# Patient Record
Sex: Female | Born: 1965
Health system: Southern US, Community
[De-identification: ages and names within clinical notes are randomized; demographics above are authoritative.]

## PROBLEM LIST (undated history)

## (undated) DIAGNOSIS — Z803 Family history of malignant neoplasm of breast: Secondary | ICD-10-CM

## (undated) DIAGNOSIS — I10 Essential (primary) hypertension: Secondary | ICD-10-CM

## (undated) DIAGNOSIS — Z8042 Family history of malignant neoplasm of prostate: Secondary | ICD-10-CM

## (undated) DIAGNOSIS — F32A Depression, unspecified: Secondary | ICD-10-CM

## (undated) DIAGNOSIS — E785 Hyperlipidemia, unspecified: Secondary | ICD-10-CM

## (undated) DIAGNOSIS — T7840XA Allergy, unspecified, initial encounter: Secondary | ICD-10-CM

## (undated) DIAGNOSIS — R791 Abnormal coagulation profile: Secondary | ICD-10-CM

## (undated) DIAGNOSIS — Z801 Family history of malignant neoplasm of trachea, bronchus and lung: Secondary | ICD-10-CM

## (undated) DIAGNOSIS — F329 Major depressive disorder, single episode, unspecified: Secondary | ICD-10-CM

## (undated) DIAGNOSIS — F419 Anxiety disorder, unspecified: Secondary | ICD-10-CM

## (undated) DIAGNOSIS — E079 Disorder of thyroid, unspecified: Secondary | ICD-10-CM

## (undated) DIAGNOSIS — E039 Hypothyroidism, unspecified: Secondary | ICD-10-CM

## (undated) DIAGNOSIS — J45909 Unspecified asthma, uncomplicated: Secondary | ICD-10-CM

## (undated) DIAGNOSIS — Z800 Family history of malignant neoplasm of digestive organs: Secondary | ICD-10-CM

## (undated) DIAGNOSIS — I252 Old myocardial infarction: Secondary | ICD-10-CM

## (undated) HISTORY — DX: Family history of malignant neoplasm of prostate: Z80.42

## (undated) HISTORY — DX: Family history of malignant neoplasm of digestive organs: Z80.0

## (undated) HISTORY — DX: Depression, unspecified: F32.A

## (undated) HISTORY — PX: EYE SURGERY: SHX253

## (undated) HISTORY — DX: Family history of malignant neoplasm of trachea, bronchus and lung: Z80.1

## (undated) HISTORY — DX: Major depressive disorder, single episode, unspecified: F32.9

## (undated) HISTORY — DX: Disorder of thyroid, unspecified: E07.9

## (undated) HISTORY — DX: Anxiety disorder, unspecified: F41.9

## (undated) HISTORY — DX: Allergy, unspecified, initial encounter: T78.40XA

## (undated) HISTORY — DX: Essential (primary) hypertension: I10

## (undated) HISTORY — PX: APPENDECTOMY: SHX54

## (undated) HISTORY — DX: Family history of malignant neoplasm of breast: Z80.3

## (undated) HISTORY — DX: Hyperlipidemia, unspecified: E78.5

---
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
## 2013-11-24 DIAGNOSIS — R519 Headache, unspecified: Secondary | ICD-10-CM | POA: Insufficient documentation

## 2013-11-24 DIAGNOSIS — G8929 Other chronic pain: Secondary | ICD-10-CM | POA: Insufficient documentation

## 2013-11-24 DIAGNOSIS — R51 Headache: Secondary | ICD-10-CM

## 2013-11-24 DIAGNOSIS — I10 Essential (primary) hypertension: Secondary | ICD-10-CM | POA: Insufficient documentation

## 2013-12-10 DIAGNOSIS — M51379 Other intervertebral disc degeneration, lumbosacral region without mention of lumbar back pain or lower extremity pain: Secondary | ICD-10-CM | POA: Insufficient documentation

## 2013-12-10 DIAGNOSIS — M5137 Other intervertebral disc degeneration, lumbosacral region: Secondary | ICD-10-CM | POA: Insufficient documentation

## 2014-06-10 DIAGNOSIS — F332 Major depressive disorder, recurrent severe without psychotic features: Secondary | ICD-10-CM | POA: Insufficient documentation

## 2014-09-28 DIAGNOSIS — J4540 Moderate persistent asthma, uncomplicated: Secondary | ICD-10-CM | POA: Insufficient documentation

## 2015-05-04 DIAGNOSIS — E782 Mixed hyperlipidemia: Secondary | ICD-10-CM | POA: Insufficient documentation

## 2015-06-28 DIAGNOSIS — I252 Old myocardial infarction: Secondary | ICD-10-CM | POA: Insufficient documentation

## 2016-11-08 LAB — HM COLONOSCOPY

## 2017-04-15 LAB — HM MAMMOGRAPHY

## 2017-10-16 LAB — HM PAP SMEAR: HM PAP: NEGATIVE

## 2018-11-30 ENCOUNTER — Ambulatory Visit (INDEPENDENT_AMBULATORY_CARE_PROVIDER_SITE_OTHER): Payer: 59 | Admitting: Physician Assistant

## 2018-11-30 ENCOUNTER — Encounter: Payer: Self-pay | Admitting: Physician Assistant

## 2018-11-30 VITALS — BP 125/75 | HR 82 | Resp 14 | Ht 62.0 in | Wt 163.0 lb

## 2018-11-30 DIAGNOSIS — Z7689 Persons encountering health services in other specified circumstances: Secondary | ICD-10-CM | POA: Diagnosis not present

## 2018-11-30 DIAGNOSIS — I10 Essential (primary) hypertension: Secondary | ICD-10-CM | POA: Diagnosis not present

## 2018-11-30 DIAGNOSIS — E063 Autoimmune thyroiditis: Secondary | ICD-10-CM | POA: Diagnosis not present

## 2018-11-30 DIAGNOSIS — Z780 Asymptomatic menopausal state: Secondary | ICD-10-CM | POA: Diagnosis not present

## 2018-11-30 DIAGNOSIS — F331 Major depressive disorder, recurrent, moderate: Secondary | ICD-10-CM

## 2018-11-30 DIAGNOSIS — Z1231 Encounter for screening mammogram for malignant neoplasm of breast: Secondary | ICD-10-CM

## 2018-11-30 DIAGNOSIS — E782 Mixed hyperlipidemia: Secondary | ICD-10-CM | POA: Diagnosis not present

## 2018-11-30 DIAGNOSIS — Z5320 Procedure and treatment not carried out because of patient's decision for unspecified reasons: Secondary | ICD-10-CM

## 2018-11-30 DIAGNOSIS — J4520 Mild intermittent asthma, uncomplicated: Secondary | ICD-10-CM | POA: Diagnosis not present

## 2018-11-30 DIAGNOSIS — Z5181 Encounter for therapeutic drug level monitoring: Secondary | ICD-10-CM

## 2018-11-30 MED ORDER — ALBUTEROL SULFATE 108 (90 BASE) MCG/ACT IN AEPB: 1.0000 | INHALATION_SPRAY | RESPIRATORY_TRACT | 4 refills | Status: DC | PRN

## 2018-11-30 MED ORDER — LISINOPRIL 20 MG PO TABS: 20.0000 mg | ORAL_TABLET | Freq: Every day | ORAL | 1 refills | Status: DC

## 2018-11-30 MED ORDER — LEVOTHYROXINE SODIUM 50 MCG PO TABS: 50.0000 ug | ORAL_TABLET | Freq: Every day | ORAL | 1 refills | Status: DC

## 2018-11-30 MED ORDER — RED YEAST RICE 600 MG PO TABS: 2.0000 | ORAL_TABLET | Freq: Three times a day (TID) | ORAL | 0 refills | Status: DC

## 2018-11-30 MED ORDER — ASPIRIN EC 81 MG PO TBEC: 81.0000 mg | DELAYED_RELEASE_TABLET | Freq: Every day | ORAL | 3 refills | Status: DC

## 2018-11-30 MED ORDER — AMLODIPINE BESYLATE 5 MG PO TABS: 5.0000 mg | ORAL_TABLET | Freq: Every day | ORAL | 1 refills | Status: DC

## 2018-11-30 MED FILL — LISINOPRIL 20 MG TABLET: 20 | 90 days supply | Qty: 90 | Fill #0

## 2018-11-30 MED FILL — AMLODIPINE BESYLATE 5 MG TA: 5 | 90 days supply | Qty: 90 | Fill #0

## 2018-11-30 MED FILL — PROAIR RESPICLICK INHAL PWD: 108 (90 BAS | 17 days supply | Qty: 1 | Fill #0

## 2018-11-30 MED FILL — ASPIRIN ADULT LOW STRENGTH: 81 | 90 days supply | Qty: 90 | Fill #0

## 2018-11-30 MED FILL — LEVOTHYROXINE 50 MCG TABLET: 50 | 90 days supply | Qty: 90 | Fill #0

## 2018-11-30 NOTE — Progress Notes (Signed)
HPI:                                                                Shannon Santiago is a 53 y.o. female who presents to [facility] today to establish care  Current concerns: Rx refills  HTN: diagnosed in 2015. Taking Lisinopril and Amlodipine daily. Compliant with medications. Does not regularly check BP's at home. States she is doing intermittent fasting. Denies vision change, headache, chest pain with exertion, orthopnea, lightheadedness, syncope and edema. Risk factors include: HLD, postmenopausal  Depression/Anxiety: followed by a counselor currently Shannon Deis, LCSW). Reports she has taken multiple medications in the past, but they all caused adverse effects. She is not interested in taking medication at this time. Denies symptoms of mania/hypomania. Denies suicidal thinking. Denies auditory/visual hallucinations.  Multiple allergies: reports allergies to milk/dairy and peanuts as well as environmental allergens. States she has had anaphylaxis to peanuts in the past, but she actually eats peanut products fairly regularly and doesn't always have a problem with them.  Asthma: triggers include dust, cold/heat, emotional stress. No exacerbations in the last year. Reports mood changes with Singulair. Does not think she can afford inhalers. Currently uses Albuterol prn, but has not needed it in the last year.  Depression screen PHQ 2/9 11/30/2018  Decreased Interest 2  Down, Depressed, Hopeless 3  PHQ - 2 Score 5  Altered sleeping 3  Tired, decreased energy 3  Change in appetite 2  Feeling bad or failure about yourself  3  Trouble concentrating 2  Moving slowly or fidgety/restless 0  Suicidal thoughts 0  PHQ-9 Score 18    GAD 7 : Generalized Anxiety Score 11/30/2018  Nervous, Anxious, on Edge 3  Control/stop worrying 3  Worry too much - different things 3  Trouble relaxing 3  Restless 2  Easily annoyed or irritable 2  Afraid -  awful might happen 2  Total GAD 7 Score 18      Past Medical History:  Diagnosis Date  . Allergy   . Anxiety   . Depression   . Hyperlipidemia   . Hypertension   . Thyroid disease    Past Surgical History:  Procedure Laterality Date  . EYE SURGERY     x 5   Social History   Tobacco Use  . Smoking status: Never Smoker  . Smokeless tobacco: Never Used  Substance Use Topics  . Alcohol use: Yes    Alcohol/week: 2.0 standard drinks    Types: 2 Standard drinks or equivalent per week   family history includes Diabetes in her father; Hypertension in her mother; Lung cancer in her mother; Prostate cancer in her father; Skin cancer in her father; Stroke in her mother.    ROS: Review of Systems  HENT: Positive for sinus pain.   Eyes:       + vision change  Musculoskeletal: Positive for back pain.  Neurological: Positive for headaches.  Endo/Heme/Allergies:       + heat intolerance + cold intolerance  Psychiatric/Behavioral: Positive for depression. The patient is nervous/anxious and has insomnia.      Medications: Current Outpatient Medications  Medication Sig Dispense Refill  . amLODipine (NORVASC) 5 MG tablet Take 1 tablet (5 mg total) by  mouth daily. 90 tablet 1  . fexofenadine (ALLEGRA) 180 MG tablet Take by mouth.    . levothyroxine (SYNTHROID, LEVOTHROID) 50 MCG tablet Take 1 tablet (50 mcg total) by mouth daily before breakfast. 90 tablet 1  . lisinopril (PRINIVIL,ZESTRIL) 20 MG tablet Take 1 tablet (20 mg total) by mouth daily. 90 tablet 1  . Albuterol Sulfate (PROAIR RESPICLICK) 834 (90 Base) MCG/ACT AEPB Inhale 1-2 puffs into the lungs every 4 (four) hours as needed. 1 each 4  . aspirin EC 81 MG tablet Take 1 tablet (81 mg total) by mouth daily. 90 tablet 3  . Red Yeast Rice 600 MG TABS Take 2 tablets (1,200 mg total) by mouth 3 (three) times daily with meals. 90 tablet 0   No current facility-administered medications for this visit.    Allergies  Allergen  Reactions  . Morphine And Related Other (See Comments)    Numb hands and feet  . Peanut Oil Other (See Comments)    Stomach cramping, hand/foot swelling  . Penicillins Hives  . Polymyxin B-Trimethoprim Other (See Comments), Photosensitivity and Swelling  . Singulair [Montelukast Sodium] Other (See Comments)    Mood changes, worsening depression       Objective:  BP 125/75   Pulse 82   Resp 14   Ht 5\' 2"  (1.575 m)   Wt 163 lb (73.9 kg)   SpO2 97%   BMI 29.81 kg/m  Gen:  alert, not ill-appearing, no distress, appropriate for age HEENT: head normocephalic without obvious abnormality, conjunctiva and cornea clear, trachea midline Pulm: Normal work of breathing, normal phonation, clear to auscultation bilaterally, no wheezes, rales or rhonchi CV: Normal rate, regular rhythm, s1 and s2 distinct, no murmurs, clicks or rubs  Neuro: alert and oriented x 3, no tremor MSK: extremities atraumatic, normal gait and station Skin: intact, no rashes on exposed skin, no jaundice, no cyanosis Psych: appearance casual, cooperative, good eye contact, depressed mood, affect mood-congruent, speech is articulate, thought processes clear and goal-directed    No results found for this or any previous visit (from the past 72 hour(s)). No results found.    Assessment and Plan: 53 y.o. female with   .Jeanett was seen today for establish care.  Diagnoses and all orders for this visit:  Encounter to establish care  Hypertension goal BP (blood pressure) < 130/80 -     amLODipine (NORVASC) 5 MG tablet; Take 1 tablet (5 mg total) by mouth daily. -     lisinopril (PRINIVIL,ZESTRIL) 20 MG tablet; Take 1 tablet (20 mg total) by mouth daily. -     aspirin EC 81 MG tablet; Take 1 tablet (81 mg total) by mouth daily. -     CBC -     COMPLETE METABOLIC PANEL WITH GFR  Hashimoto's thyroiditis -     levothyroxine (SYNTHROID, LEVOTHROID) 50 MCG tablet; Take 1 tablet (50 mcg total) by mouth daily before  breakfast. -     TSH + free T4  Postmenopausal  Moderate persistent asthma without complication -     Albuterol Sulfate (PROAIR RESPICLICK) 196 (90 Base) MCG/ACT AEPB; Inhale 1-2 puffs into the lungs every 4 (four) hours as needed.  Mixed hyperlipidemia -     Lipid Panel w/reflex Direct LDL -     Red Yeast Rice 600 MG TABS; Take 2 tablets (1,200 mg total) by mouth 3 (three) times daily with meals.  Moderate episode of recurrent major depressive disorder (HCC)  Statin declined  Breast cancer screening by  mammogram -     MM 3D SCREEN BREAST BILATERAL; Future  Medication monitoring encounter -     CBC -     COMPLETE METABOLIC PANEL WITH GFR -     TSH + free T4    - Personally reviewed PMH, PSH, PFH, medications, allergies, HM - Personally reviewed labs in Care Everywhere dated 04/15/18 - Age-appropriate cancer screening: Pap UTD 10/13/17 NILM, HPV negative, will abstract; Colonoscopy UTD per patient, requesting record from [facility] - Influenza UTD - Tdap UTD - PHQ2 negative  HTN CMP 03/2018 shows normal renal function with Scr 0.81, GFR 84 BP in range in office today Baby aspirin 81 mg recommended for primary prevention  Cont current medications. Refills provided  Mixed hyperlipidemia Lipid profile (03/2018) TC 251  LDL 186 Declined statin therapy Recommended red yeast rice extract  Hashimotos TSH 2.21 (03/2018) Cont Levothyroxine 50 mcg  MDD PHQ9=18, no acute safety issues Declines medication mgmt. Reports hx of multiple medication intolerances Keep f/u with counselor She was provided with information on optional herbal supplements that may be beneficial for depressive symptoms  Asthma Mild, intermittent Pulse ox 97% on RA at rest Cont Albuterol 1-2 puff Q4 hr prn  Patient education and anticipatory guidance given Patient agrees with treatment plan Follow-up in 4 months for CPE w/fasting labs or sooner as needed if symptoms worsen or fail to  improve  Darlyne Russian PA-C

## 2018-11-30 NOTE — Patient Instructions (Addendum)
For your blood pressure: - Goal <130/80 (Ideally 120's/70's) - take your blood pressure medication in the morning (unless instructed differently) - take baby aspirin 81 mg daily to help prevent heart attack/stroke - monitor and log blood pressures at home - check around the same time each day in a relaxed setting - Limit salt to <2500 mg/day - Follow DASH (Dietary Approach to Stopping Hypertension) eating plan - Try to get at least 150 minutes of aerobic exercise per week - Aim to go on a brisk walk 30 minutes per day at least 5 days per week. If you're not active, gradually increase how long you walk by 5 minutes each week - limit alcohol: 2 standard drinks per day for men and 1 per day for women - avoid tobacco/nicotine products. Consider smoking cessation if you smoke - weight loss: 7% of current body weight can reduce your blood pressure by 5-10 points - follow-up at least every 6 months for your blood pressure. Follow-up sooner if your BP is not controlled   DASH Eating Plan DASH stands for "Dietary Approaches to Stop Hypertension." The DASH eating plan is a healthy eating plan that has been shown to reduce high blood pressure (hypertension). It may also reduce your risk for type 2 diabetes, heart disease, and stroke. The DASH eating plan may also help with weight loss. What are tips for following this plan?  General guidelines  Avoid eating more than 2,300 mg (milligrams) of salt (sodium) a day. If you have hypertension, you may need to reduce your sodium intake to 1,500 mg a day.  Limit alcohol intake to no more than 1 drink a day for nonpregnant women and 2 drinks a day for men. One drink equals 12 oz of beer, 5 oz of wine, or 1 oz of hard liquor.  Work with your health care provider to maintain a healthy body weight or to lose weight. Ask what an ideal weight is for you.  Get at least 30 minutes of exercise that causes your heart to beat faster (aerobic exercise) most days of  the week. Activities may include walking, swimming, or biking.  Work with your health care provider or diet and nutrition specialist (dietitian) to adjust your eating plan to your individual calorie needs. Reading food labels   Check food labels for the amount of sodium per serving. Choose foods with less than 5 percent of the Daily Value of sodium. Generally, foods with less than 300 mg of sodium per serving fit into this eating plan.  To find whole grains, look for the word "whole" as the first word in the ingredient list. Shopping  Buy products labeled as "low-sodium" or "no salt added."  Buy fresh foods. Avoid canned foods and premade or frozen meals. Cooking  Avoid adding salt when cooking. Use salt-free seasonings or herbs instead of table salt or sea salt. Check with your health care provider or pharmacist before using salt substitutes.  Do not fry foods. Cook foods using healthy methods such as baking, boiling, grilling, and broiling instead.  Cook with heart-healthy oils, such as olive, canola, soybean, or sunflower oil. Meal planning  Eat a balanced diet that includes: ? 5 or more servings of fruits and vegetables each day. At each meal, try to fill half of your plate with fruits and vegetables. ? Up to 6-8 servings of whole grains each day. ? Less than 6 oz of lean meat, poultry, or fish each day. A 3-oz serving of meat is about   the same size as a deck of cards. One egg equals 1 oz. ? 2 servings of low-fat dairy each day. ? A serving of nuts, seeds, or beans 5 times each week. ? Heart-healthy fats. Healthy fats called Omega-3 fatty acids are found in foods such as flaxseeds and coldwater fish, like sardines, salmon, and mackerel.  Limit how much you eat of the following: ? Canned or prepackaged foods. ? Food that is high in trans fat, such as fried foods. ? Food that is high in saturated fat, such as fatty meat. ? Sweets, desserts, sugary drinks, and other foods with  added sugar. ? Full-fat dairy products.  Do not salt foods before eating.  Try to eat at least 2 vegetarian meals each week.  Eat more home-cooked food and less restaurant, buffet, and fast food.  When eating at a restaurant, ask that your food be prepared with less salt or no salt, if possible. What foods are recommended? The items listed may not be a complete list. Talk with your dietitian about what dietary choices are best for you. Grains Whole-grain or whole-wheat bread. Whole-grain or whole-wheat pasta. Brown rice. Oatmeal. Quinoa. Bulgur. Whole-grain and low-sodium cereals. Pita bread. Low-fat, low-sodium crackers. Whole-wheat flour tortillas. Vegetables Fresh or frozen vegetables (raw, steamed, roasted, or grilled). Low-sodium or reduced-sodium tomato and vegetable juice. Low-sodium or reduced-sodium tomato sauce and tomato paste. Low-sodium or reduced-sodium canned vegetables. Fruits All fresh, dried, or frozen fruit. Canned fruit in natural juice (without added sugar). Meat and other protein foods Skinless chicken or turkey. Ground chicken or turkey. Pork with fat trimmed off. Fish and seafood. Egg whites. Dried beans, peas, or lentils. Unsalted nuts, nut butters, and seeds. Unsalted canned beans. Lean cuts of beef with fat trimmed off. Low-sodium, lean deli meat. Dairy Low-fat (1%) or fat-free (skim) milk. Fat-free, low-fat, or reduced-fat cheeses. Nonfat, low-sodium ricotta or cottage cheese. Low-fat or nonfat yogurt. Low-fat, low-sodium cheese. Fats and oils Soft margarine without trans fats. Vegetable oil. Low-fat, reduced-fat, or light mayonnaise and salad dressings (reduced-sodium). Canola, safflower, olive, soybean, and sunflower oils. Avocado. Seasoning and other foods Herbs. Spices. Seasoning mixes without salt. Unsalted popcorn and pretzels. Fat-free sweets. What foods are not recommended? The items listed may not be a complete list. Talk with your dietitian about what  dietary choices are best for you. Grains Baked goods made with fat, such as croissants, muffins, or some breads. Dry pasta or rice meal packs. Vegetables Creamed or fried vegetables. Vegetables in a cheese sauce. Regular canned vegetables (not low-sodium or reduced-sodium). Regular canned tomato sauce and paste (not low-sodium or reduced-sodium). Regular tomato and vegetable juice (not low-sodium or reduced-sodium). Pickles. Olives. Fruits Canned fruit in a light or heavy syrup. Fried fruit. Fruit in cream or butter sauce. Meat and other protein foods Fatty cuts of meat. Ribs. Fried meat. Bacon. Sausage. Bologna and other processed lunch meats. Salami. Fatback. Hotdogs. Bratwurst. Salted nuts and seeds. Canned beans with added salt. Canned or smoked fish. Whole eggs or egg yolks. Chicken or turkey with skin. Dairy Whole or 2% milk, cream, and half-and-half. Whole or full-fat cream cheese. Whole-fat or sweetened yogurt. Full-fat cheese. Nondairy creamers. Whipped toppings. Processed cheese and cheese spreads. Fats and oils Butter. Stick margarine. Lard. Shortening. Ghee. Bacon fat. Tropical oils, such as coconut, palm kernel, or palm oil. Seasoning and other foods Salted popcorn and pretzels. Onion salt, garlic salt, seasoned salt, table salt, and sea salt. Worcestershire sauce. Tartar sauce. Barbecue sauce. Teriyaki sauce. Soy sauce,   including reduced-sodium. Steak sauce. Canned and packaged gravies. Fish sauce. Oyster sauce. Cocktail sauce. Horseradish that you find on the shelf. Ketchup. Mustard. Meat flavorings and tenderizers. Bouillon cubes. Hot sauce and Tabasco sauce. Premade or packaged marinades. Premade or packaged taco seasonings. Relishes. Regular salad dressings. Where to find more information:  National Heart, Lung, and Blood Institute: www.nhlbi.nih.gov  American Heart Association: www.heart.org Summary  The DASH eating plan is a healthy eating plan that has been shown to reduce  high blood pressure (hypertension). It may also reduce your risk for type 2 diabetes, heart disease, and stroke.  With the DASH eating plan, you should limit salt (sodium) intake to 2,300 mg a day. If you have hypertension, you may need to reduce your sodium intake to 1,500 mg a day.  When on the DASH eating plan, aim to eat more fresh fruits and vegetables, whole grains, lean proteins, low-fat dairy, and heart-healthy fats.  Work with your health care provider or diet and nutrition specialist (dietitian) to adjust your eating plan to your individual calorie needs. This information is not intended to replace advice given to you by your health care provider. Make sure you discuss any questions you have with your health care provider. Document Released: 09/26/2011 Document Revised: 09/30/2016 Document Reviewed: 09/30/2016 Elsevier Interactive Patient Education  2019 [employer]  

## 2018-12-10 ENCOUNTER — Encounter: Payer: Self-pay | Admitting: Physician Assistant

## 2018-12-24 ENCOUNTER — Encounter: Payer: Self-pay | Admitting: Physician Assistant

## 2018-12-24 NOTE — Progress Notes (Signed)
Negative for intraepithelial lesion or malignancy.  

## 2019-03-18 MED FILL — LEVOTHYROXINE 50 MCG TABLET: 50 | 90 days supply | Qty: 90 | Fill #1

## 2019-03-18 MED FILL — AMLODIPINE BESYLATE 5 MG TA: 5 | 90 days supply | Qty: 90 | Fill #1

## 2019-03-18 MED FILL — LISINOPRIL 20 MG TABLET: 20 | 90 days supply | Qty: 90 | Fill #1

## 2019-04-28 DIAGNOSIS — Z5181 Encounter for therapeutic drug level monitoring: Secondary | ICD-10-CM | POA: Diagnosis not present

## 2019-04-28 DIAGNOSIS — I10 Essential (primary) hypertension: Secondary | ICD-10-CM | POA: Diagnosis not present

## 2019-04-28 DIAGNOSIS — E063 Autoimmune thyroiditis: Secondary | ICD-10-CM | POA: Diagnosis not present

## 2019-04-28 DIAGNOSIS — E782 Mixed hyperlipidemia: Secondary | ICD-10-CM | POA: Diagnosis not present

## 2019-04-29 LAB — COMPLETE METABOLIC PANEL WITH GFR
AG Ratio: 1.7 (calc) (ref 1.0–2.5)
ALKALINE PHOSPHATASE (APISO): 63 U/L (ref 37–153)
ALT: 20 U/L (ref 6–29)
AST: 13 U/L (ref 10–35)
Albumin: 4.5 g/dL (ref 3.6–5.1)
BUN: 15 mg/dL (ref 7–25)
CO2: 27 mmol/L (ref 20–32)
Calcium: 9.8 mg/dL (ref 8.6–10.4)
Chloride: 101 mmol/L (ref 98–110)
Creat: 0.8 mg/dL (ref 0.50–1.05)
GFR, Est African American: 98 mL/min/{1.73_m2} (ref >=60)
GFR, Est Non African American: 84 mL/min/{1.73_m2} (ref >=60)
Globulin: 2.6 g/dL (calc) (ref 1.9–3.7)
Glucose, Bld: 104 mg/dL — ABNORMAL HIGH (ref 65–99)
Potassium: 4.3 mmol/L (ref 3.5–5.3)
Sodium: 137 mmol/L (ref 135–146)
Total Bilirubin: 0.5 mg/dL (ref 0.2–1.2)
Total Protein: 7.1 g/dL (ref 6.1–8.1)

## 2019-04-29 LAB — CBC
HCT: 41 % (ref 35.0–45.0)
Hemoglobin: 13.9 g/dL (ref 11.7–15.5)
MCH: 32.3 pg (ref 27.0–33.0)
MCHC: 33.9 g/dL (ref 32.0–36.0)
MCV: 95.1 fL (ref 80.0–100.0)
MPV: 9.8 fL (ref 7.5–12.5)
Platelets: 390 10*3/uL (ref 140–400)
RBC: 4.31 10*6/uL (ref 3.80–5.10)
RDW: 12.3 % (ref 11.0–15.0)
WBC: 6.5 10*3/uL (ref 3.8–10.8)

## 2019-04-29 LAB — LIPID PANEL W/REFLEX DIRECT LDL
Cholesterol: 255 mg/dL — ABNORMAL HIGH (ref <200)
HDL: 46 mg/dL — ABNORMAL LOW (ref >=50)
LDL Cholesterol (Calc): 180 mg/dL (calc) — ABNORMAL HIGH
NON-HDL CHOLESTEROL (CALC): 209 mg/dL — AB (ref <130)
TRIGLYCERIDES: 145 mg/dL (ref <150)
Total CHOL/HDL Ratio: 5.5 (calc) — ABNORMAL HIGH (ref <5.0)

## 2019-04-29 LAB — TSH+FREE T4: TSH W/REFLEX TO FT4: 1.81 mIU/L

## 2019-05-03 ENCOUNTER — Encounter: Payer: Self-pay | Admitting: Physician Assistant

## 2019-05-03 DIAGNOSIS — R7301 Impaired fasting glucose: Secondary | ICD-10-CM | POA: Insufficient documentation

## 2019-05-06 ENCOUNTER — Ambulatory Visit (INDEPENDENT_AMBULATORY_CARE_PROVIDER_SITE_OTHER): Payer: 59 | Admitting: Physician Assistant

## 2019-05-06 ENCOUNTER — Encounter: Payer: Self-pay | Admitting: Physician Assistant

## 2019-05-06 ENCOUNTER — Other Ambulatory Visit: Payer: Self-pay

## 2019-05-06 VITALS — BP 123/83 | HR 85 | Temp 98.2°F | Wt 165.0 lb

## 2019-05-06 DIAGNOSIS — E782 Mixed hyperlipidemia: Secondary | ICD-10-CM | POA: Diagnosis not present

## 2019-05-06 DIAGNOSIS — R531 Weakness: Secondary | ICD-10-CM | POA: Diagnosis not present

## 2019-05-06 DIAGNOSIS — M722 Plantar fascial fibromatosis: Secondary | ICD-10-CM | POA: Diagnosis not present

## 2019-05-06 DIAGNOSIS — R202 Paresthesia of skin: Secondary | ICD-10-CM | POA: Diagnosis not present

## 2019-05-06 DIAGNOSIS — R5382 Chronic fatigue, unspecified: Secondary | ICD-10-CM

## 2019-05-06 DIAGNOSIS — Z87892 Personal history of anaphylaxis: Secondary | ICD-10-CM

## 2019-05-06 DIAGNOSIS — F331 Major depressive disorder, recurrent, moderate: Secondary | ICD-10-CM | POA: Diagnosis not present

## 2019-05-06 DIAGNOSIS — Z23 Encounter for immunization: Secondary | ICD-10-CM

## 2019-05-06 DIAGNOSIS — Z0000 Encounter for general adult medical examination without abnormal findings: Secondary | ICD-10-CM

## 2019-05-06 DIAGNOSIS — Z5320 Procedure and treatment not carried out because of patient's decision for unspecified reasons: Secondary | ICD-10-CM

## 2019-05-06 DIAGNOSIS — Z0001 Encounter for general adult medical examination with abnormal findings: Secondary | ICD-10-CM | POA: Diagnosis not present

## 2019-05-06 DIAGNOSIS — R4189 Other symptoms and signs involving cognitive functions and awareness: Secondary | ICD-10-CM

## 2019-05-06 MED ORDER — EPINEPHRINE 0.3 MG/0.3ML IJ SOAJ: 0.3000 mg | Freq: Once | INTRAMUSCULAR | 3 refills | Status: DC | PRN

## 2019-05-06 MED ORDER — BUPROPION HCL 75 MG PO TABS: ORAL_TABLET | ORAL | 1 refills | Status: DC

## 2019-05-06 MED FILL — EPINEPHRINE 0.3 MG AUTO-INJ: 0.3 | 2 days supply | Qty: 2 | Fill #0

## 2019-05-06 MED FILL — buPROPion HCL 75 MG TABS: 75 | 30 days supply | Qty: 60 | Fill #0

## 2019-05-06 NOTE — Patient Instructions (Addendum)
For heel pain: - wear nighttime splints for 1 month - do rehab exercises daily - wear orthotics  - follow-up with Sports Medicine in 1 month if no better  For depression/fatigue: - start Wellbutrin 75 mg daily - option to increase to twice a day after 1 week (may cause sleep disturbance so take 2nd dose in the early-mid-afternoon) - aim to walk for at least 5 minutes outside every day to get some vitamin D and exercise. Gradually increase duration as tolerated  For weakness/tingling/muscle cramps: - Labs today to r/o inflammatory arthritis, lupus, lyme, vitamin deficiency   Preventive Care 67-53 Years Old, Female Preventive care refers to visits with your health care provider and lifestyle choices that can promote health and wellness. This includes:  A yearly physical exam. This may also be called an annual well check.  Regular dental visits and eye exams.  Immunizations.  Screening for certain conditions.  Healthy lifestyle choices, such as eating a healthy diet, getting regular exercise, not using drugs or products that contain nicotine and tobacco, and limiting alcohol use. What can I expect for my preventive care visit? Physical exam Your health care provider will check your:  Height and weight. This may be used to calculate body mass index (BMI), which tells if you are at a healthy weight.  Heart rate and blood pressure.  Skin for abnormal spots. Counseling Your health care provider may ask you questions about your:  Alcohol, tobacco, and drug use.  Emotional well-being.  Home and relationship well-being.  Sexual activity.  Eating habits.  Work and work Statistician.  Method of birth control.  Menstrual cycle.  Pregnancy history. What immunizations do I need?  Influenza (flu) vaccine  This is recommended every year. Tetanus, diphtheria, and pertussis (Tdap) vaccine  You may need a Td booster every 10 years. Varicella (chickenpox) vaccine  You may  need this if you have not been vaccinated. Zoster (shingles) vaccine  You may need this after age 1. Measles, mumps, and rubella (MMR) vaccine  You may need at least one dose of MMR if you were born in 1957 or later. You may also need a second dose. Pneumococcal conjugate (PCV13) vaccine  You may need this if you have certain conditions and were not previously vaccinated. Pneumococcal polysaccharide (PPSV23) vaccine  You may need one or two doses if you smoke cigarettes or if you have certain conditions. Meningococcal conjugate (MenACWY) vaccine  You may need this if you have certain conditions. Hepatitis A vaccine  You may need this if you have certain conditions or if you travel or work in places where you may be exposed to hepatitis A. Hepatitis B vaccine  You may need this if you have certain conditions or if you travel or work in places where you may be exposed to hepatitis B. Haemophilus influenzae type b (Hib) vaccine  You may need this if you have certain conditions. Human papillomavirus (HPV) vaccine  If recommended by your health care provider, you may need three doses over 6 months. You may receive vaccines as individual doses or as more than one vaccine together in one shot (combination vaccines). Talk with your health care provider about the risks and benefits of combination vaccines. What tests do I need? Blood tests  Lipid and cholesterol levels. These may be checked every 5 years, or more frequently if you are over 68 years old.  Hepatitis C test.  Hepatitis B test. Screening  Lung cancer screening. You may have this screening  every year starting at age 54 if you have a 30-pack-year history of smoking and currently smoke or have quit within the past 15 years.  Colorectal cancer screening. All adults should have this screening starting at age 36 and continuing until age 56. Your health care provider may recommend screening at age 74 if you are at increased  risk. You will have tests every 1-10 years, depending on your results and the type of screening test.  Diabetes screening. This is done by checking your blood sugar (glucose) after you have not eaten for a while (fasting). You may have this done every 1-3 years.  Mammogram. This may be done every 1-2 years. Talk with your health care provider about when you should start having regular mammograms. This may depend on whether you have a family history of breast cancer.  BRCA-related cancer screening. This may be done if you have a family history of breast, ovarian, tubal, or peritoneal cancers.  Pelvic exam and Pap test. This may be done every 3 years starting at age 56. Starting at age 68, this may be done every 5 years if you have a Pap test in combination with an HPV test. Other tests  Sexually transmitted disease (STD) testing.  Bone density scan. This is done to screen for osteoporosis. You may have this scan if you are at high risk for osteoporosis. Follow these instructions at home: Eating and drinking  Eat a diet that includes fresh fruits and vegetables, whole grains, lean protein, and low-fat dairy.  Take vitamin and mineral supplements as recommended by your health care provider.  Do not drink alcohol if: ? Your health care provider tells you not to drink. ? You are pregnant, may be pregnant, or are planning to become pregnant.  If you drink alcohol: ? Limit how much you have to 0-1 drink a day. ? Be aware of how much alcohol is in your drink. In the [place] one drink equals one 12 oz bottle of beer (355 mL), one 5 oz glass of wine (148 mL), or one 1 oz glass of hard liquor (44 mL). Lifestyle  Take daily care of your teeth and gums.  Stay active. Exercise for at least 30 minutes on 5 or more days each week.  Do not use any products that contain nicotine or tobacco, such as cigarettes, e-cigarettes, and chewing tobacco. If you need help quitting, ask your health care  provider.  If you are sexually active, practice safe sex. Use a condom or other form of birth control (contraception) in order to prevent pregnancy and STIs (sexually transmitted infections).  If told by your health care provider, take low-dose aspirin daily starting at age 86. What's next?  Visit your health care provider once a year for a well check visit.  Ask your health care provider how often you should have your eyes and teeth checked.  Stay up to date on all vaccines. This information is not intended to replace advice given to you by your health care provider. Make sure you discuss any questions you have with your health care provider. Document Released: 11/03/2015 Document Revised: 06/18/2018 Document Reviewed: 06/18/2018 Elsevier Patient Education  2020 [place] DASH stands for "Dietary Approaches to Stop Hypertension." The DASH eating plan is a healthy eating plan that has been shown to reduce high blood pressure (hypertension). It may also reduce your risk for type 2 diabetes, heart disease, and stroke. The DASH eating plan may also help  with weight loss. What are tips for following this plan?  General guidelines  Avoid eating more than 2,300 mg (milligrams) of salt (sodium) a day. If you have hypertension, you may need to reduce your sodium intake to 1,500 mg a day.  Limit alcohol intake to no more than 1 drink a day for nonpregnant women and 2 drinks a day for men. One drink equals 12 oz of beer, 5 oz of wine, or 1 oz of hard liquor.  Work with your health care provider to maintain a healthy body weight or to lose weight. Ask what an ideal weight is for you.  Get at least 30 minutes of exercise that causes your heart to beat faster (aerobic exercise) most days of the week. Activities may include walking, swimming, or biking.  Work with your health care provider or diet and nutrition specialist (dietitian) to adjust your eating plan to your  individual calorie needs. Reading food labels   Check food labels for the amount of sodium per serving. Choose foods with less than 5 percent of the Daily Value of sodium. Generally, foods with less than 300 mg of sodium per serving fit into this eating plan.  To find whole grains, look for the word "whole" as the first word in the ingredient list. Shopping  Buy products labeled as "low-sodium" or "no salt added."  Buy fresh foods. Avoid canned foods and premade or frozen meals. Cooking  Avoid adding salt when cooking. Use salt-free seasonings or herbs instead of table salt or sea salt. Check with your health care provider or pharmacist before using salt substitutes.  Do not fry foods. Cook foods using healthy methods such as baking, boiling, grilling, and broiling instead.  Cook with heart-healthy oils, such as olive, canola, soybean, or sunflower oil. Meal planning  Eat a balanced diet that includes: ? 5 or more servings of fruits and vegetables each day. At each meal, try to fill half of your plate with fruits and vegetables. ? Up to 6-8 servings of whole grains each day. ? Less than 6 oz of lean meat, poultry, or fish each day. A 3-oz serving of meat is about the same size as a deck of cards. One egg equals 1 oz. ? 2 servings of low-fat dairy each day. ? A serving of nuts, seeds, or beans 5 times each week. ? Heart-healthy fats. Healthy fats called Omega-3 fatty acids are found in foods such as flaxseeds and coldwater fish, like sardines, salmon, and mackerel.  Limit how much you eat of the following: ? Canned or prepackaged foods. ? Food that is high in trans fat, such as fried foods. ? Food that is high in saturated fat, such as fatty meat. ? Sweets, desserts, sugary drinks, and other foods with added sugar. ? Full-fat dairy products.  Do not salt foods before eating.  Try to eat at least 2 vegetarian meals each week.  Eat more home-cooked food and less restaurant,  buffet, and fast food.  When eating at a restaurant, ask that your food be prepared with less salt or no salt, if possible. What foods are recommended? The items listed may not be a complete list. Talk with your dietitian about what dietary choices are best for you. Grains Whole-grain or whole-wheat bread. Whole-grain or whole-wheat pasta. Brown rice. [place]. Bulgur. Whole-grain and low-sodium cereals. Pita bread. Low-fat, low-sodium crackers. Whole-wheat flour tortillas. Vegetables Fresh or frozen vegetables (raw, steamed, roasted, or grilled). Low-sodium or reduced-sodium tomato and vegetable juice.  Low-sodium or reduced-sodium tomato sauce and tomato paste. Low-sodium or reduced-sodium canned vegetables. Fruits All fresh, dried, or frozen fruit. Canned fruit in natural juice (without added sugar). Meat and other protein foods Skinless chicken or [place]. Ground chicken or [place]. Pork with fat trimmed off. Fish and seafood. Egg whites. Dried beans, peas, or lentils. Unsalted nuts, nut butters, and seeds. Unsalted canned beans. Lean cuts of beef with fat trimmed off. Low-sodium, lean deli meat. Dairy Low-fat (1%) or fat-free (skim) milk. Fat-free, low-fat, or reduced-fat cheeses. Nonfat, low-sodium ricotta or cottage cheese. Low-fat or nonfat yogurt. Low-fat, low-sodium cheese. Fats and oils Soft margarine without trans fats. Vegetable oil. Low-fat, reduced-fat, or light mayonnaise and salad dressings (reduced-sodium). Canola, safflower, olive, soybean, and sunflower oils. Avocado. Seasoning and other foods Herbs. Spices. Seasoning mixes without salt. Unsalted popcorn and pretzels. Fat-free sweets. What foods are not recommended? The items listed may not be a complete list. Talk with your dietitian about what dietary choices are best for you. Grains Baked goods made with fat, such as croissants, muffins, or some breads. Dry pasta or rice meal packs. Vegetables Creamed or fried  vegetables. Vegetables in a cheese sauce. Regular canned vegetables (not low-sodium or reduced-sodium). Regular canned tomato sauce and paste (not low-sodium or reduced-sodium). Regular tomato and vegetable juice (not low-sodium or reduced-sodium). Angie Fava. Olives. Fruits Canned fruit in a light or heavy syrup. Fried fruit. Fruit in cream or butter sauce. Meat and other protein foods Fatty cuts of meat. Ribs. Fried meat. Berniece Salines. Sausage. Bologna and other processed lunch meats. Salami. Fatback. Hotdogs. Bratwurst. Salted nuts and seeds. Canned beans with added salt. Canned or smoked fish. Whole eggs or egg yolks. Chicken or [place] with skin. Dairy Whole or 2% milk, cream, and half-and-half. Whole or full-fat cream cheese. Whole-fat or sweetened yogurt. Full-fat cheese. Nondairy creamers. Whipped toppings. Processed cheese and cheese spreads. Fats and oils Butter. Stick margarine. Lard. Shortening. Ghee. Bacon fat. Tropical oils, such as coconut, palm kernel, or palm oil. Seasoning and other foods Salted popcorn and pretzels. Onion salt, garlic salt, seasoned salt, table salt, and sea salt. Worcestershire sauce. Tartar sauce. Barbecue sauce. Teriyaki sauce. Soy sauce, including reduced-sodium. Steak sauce. Canned and packaged gravies. Fish sauce. Oyster sauce. Cocktail sauce. Horseradish that you find on the shelf. Ketchup. Mustard. Meat flavorings and tenderizers. Bouillon cubes. Hot sauce and Tabasco sauce. Premade or packaged marinades. Premade or packaged taco seasonings. Relishes. Regular salad dressings. Where to find more information:  National Heart, Lung, and [place]: https://wilson-eaton.com/  American Heart Association: www.heart.org Summary  The DASH eating plan is a healthy eating plan that has been shown to reduce high blood pressure (hypertension). It may also reduce your risk for type 2 diabetes, heart disease, and stroke.  With the DASH eating plan, you should limit salt (sodium)  intake to 2,300 mg a day. If you have hypertension, you may need to reduce your sodium intake to 1,500 mg a day.  When on the DASH eating plan, aim to eat more fresh fruits and vegetables, whole grains, lean proteins, low-fat dairy, and heart-healthy fats.  Work with your health care provider or diet and nutrition specialist (dietitian) to adjust your eating plan to your individual calorie needs. This information is not intended to replace advice given to you by your health care provider. Make sure you discuss any questions you have with your health care provider. Document Released: 09/26/2011 Document Revised: 09/19/2017 Document Reviewed: 09/30/2016 Elsevier Patient Education  2020 [employer].

## 2019-05-06 NOTE — Progress Notes (Signed)
HPI:                                                                Shannon Santiago is a 53 y.o. female who presents to [place]: [place] today for annual physical exam  Multiple concerns today:  Bilateral heel pain: onset several months ago, gradually worsening. Worse on the right side. States heels are very painful to walk on and feel swollen. Cannot walk barefoot due to pain.   Weakness/Fatigue: endorses generalized weakness and "extreme fatigue" for several months. States she has "lost muscle tone" and has been unable to exercise or do any body weight training due to symptoms. Endorses chills alternating with sweating. Endorses parethesias in bilateral hands and feet that seem worse in the morning. Occasional headaches and "lack of mental clarity" with occasional confusion.  Depression: reports she is extremely stressed. 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 Endorses irritability and angry mood. Endorses trouble sleeping. Denies SI/HI.  Reports she has tried at least 10 different antidepressants in the past. States Wellbutrin was effective, but caused tremors. Cymbalta was also effective, but was d/c due to cost.  Needs an Epi-pen.   Depression screen [facility] 2/9 05/06/2019 11/30/2018  Decreased Interest 2 2  Down, Depressed, Hopeless 2 3  PHQ - 2 Score 4 5  Altered sleeping 3 3  Tired, decreased energy 3 3  Change in appetite 2 2  Feeling bad or failure about yourself  2 3  Trouble concentrating 2 2  Moving slowly or fidgety/restless 0 0  Suicidal thoughts 0 0  PHQ-9 Score 16 18    GAD 7 : Generalized Anxiety Score 05/06/2019 11/30/2018  Nervous, Anxious, on Edge 3 3  Control/stop worrying 2 3  Worry too much - different things 1 3  Trouble relaxing 2 3  Restless 1 2  Easily annoyed or irritable 3 2  Afraid - awful might happen 3 2  Total GAD 7 Score 15 18      Past Medical History:  Diagnosis Date   . Allergy   . Anxiety   . Depression   . Hyperlipidemia   . Hypertension   . Thyroid disease    Past Surgical History:  Procedure Laterality Date  . EYE SURGERY     x 5   Social History   Tobacco Use  . Smoking status: Never Smoker  . Smokeless tobacco: Never Used  Substance Use Topics  . Alcohol use: Yes    Alcohol/week: 2.0 standard drinks    Types: 2 Standard drinks or equivalent per week   family history includes Anxiety disorder in her mother; Depression in her mother; Diabetes in her father; Hypertension in her mother; Lung cancer in her mother; Prostate cancer in her father; Skin cancer in her father; Stroke in her mother; Thyroid disease in her maternal aunt and maternal uncle.    Review of Systems  Constitutional: Positive for chills and fatigue (severe).  Cardiovascular: Positive for palpitations.  Gastrointestinal: Positive for abdominal distention and abdominal pain.  Endocrine: Positive for heat intolerance.  Musculoskeletal: Positive for arthralgias (hands) and joint swelling (hands). Negative for gait problem.       + muscle cramps/spasms + b/l  heel pain  Skin: Positive for rash (facial (sandpaper, dry texture)).       + sweating   Allergic/Immunologic: Positive for food allergies.  Neurological: Positive for weakness (generalized) and headaches.       + paresthesias of feet/hands   Psychiatric/Behavioral: Positive for confusion, decreased concentration, dysphoric mood and sleep disturbance. Negative for self-injury and suicidal ideas. The patient is nervous/anxious.   All other systems reviewed and are negative.    Medications: Current Outpatient Medications  Medication Sig Dispense Refill  . Albuterol Sulfate (PROAIR RESPICLICK) 952 (90 Base) MCG/ACT AEPB Inhale 1-2 puffs into the lungs every 4 (four) hours as needed. 1 each 4  . amLODipine (NORVASC) 5 MG tablet Take 1 tablet (5 mg total) by mouth daily. 90 tablet 1  . Ascorbic Acid (VITAMIN C)  1000 MG tablet Take 1,000 mg by mouth daily.    Marland Kitchen b complex vitamins tablet Take 1 tablet by mouth daily.    . Cholecalciferol (VITAMIN D3) 10 MCG (400 UNIT) tablet Take 400 Units by mouth daily.    Marland Kitchen EVENING PRIMROSE OIL PO Take by mouth.    . fexofenadine (ALLEGRA) 180 MG tablet Take by mouth.    . levothyroxine (SYNTHROID, LEVOTHROID) 50 MCG tablet Take 1 tablet (50 mcg total) by mouth daily before breakfast. 90 tablet 1  . lisinopril (PRINIVIL,ZESTRIL) 20 MG tablet Take 1 tablet (20 mg total) by mouth daily. 90 tablet 1  . Multiple Vitamin (MULTIVITAMIN) capsule Take 1 capsule by mouth daily.    . Omega-3 Fatty Acids (FISH OIL) 1000 MG CAPS Take by mouth.    . Probiotic Product (PROBIOTIC ADVANCED PO) Take by mouth.    Marland Kitchen buPROPion (WELLBUTRIN) 75 MG tablet Take 1 tablet (75 mg total) by mouth daily for 7 days, THEN 1 tablet (75 mg total) 2 (two) times daily for 21 days. 60 tablet 1   No current facility-administered medications for this visit.    Allergies  Allergen Reactions  . Morphine And Related Other (See Comments)    Numb hands and feet  . Peanut Oil Other (See Comments)    Stomach cramping, hand/foot swelling  . Penicillins Hives  . Polymyxin B-Trimethoprim Other (See Comments), Photosensitivity and Swelling  . Singulair [Montelukast Sodium] Other (See Comments)    Mood changes, worsening depression       Objective:  BP 123/83   Pulse 85   Temp 98.2 F (36.8 C) (Oral)   Wt 165 lb (74.8 kg)   BMI 30.18 kg/m  General Appearance:  Alert, cooperative, no distress, appropriate for age                            Head:  Normocephalic, without obvious abnormality                             Eyes:  PERRL, EOM's intact, conjunctiva and cornea clear, wearing glasses                             Ears:  TM pearly gray color and semitransparent, external ear canals normal, both ears                            Nose:  Nares symmetrical, mucosa pink  Throat:   Lips, tongue, and mucosa are moist, pink, and intact; oropharynx clear, uvula midline; good dentition                             Neck:  Supple; symmetrical, trachea midline, no adenopathy; thyroid: no enlargement, symmetric, no tenderness/mass/nodules                                        Chest/Breast:  deferred                           Lungs:  Clear to auscultation bilaterally, respirations unlabored                             Heart:  normal rate & regular rhythm, S1 and S2 normal, no murmurs, rubs, or gallops                     Abdomen:  Soft, non-tender, no mass or organomegaly              Genitourinary:  deferred         Musculoskeletal:  Tone and strength strong and symmetrical, all extremities; no joint pain or edema, normal gait and station, tenderness overlying plantar fascia of right heel                                   Lymphatic:  No adenopathy             Skin/Hair/Nails:  Skin warm, dry and intact, no rashes or abnormal dyspigmentation on limited exam                   Neurologic:  Alert and oriented x3, no cranial nerve deficits, sensation intact to monofilament in bilateral feet, normal gait and station, no tremor Psych: cooperative, depressed mood, affect mood-congruent, speech is articulate, normal rate and volume; thought processes clear and goal-directed, normal judgment, good insight   Wt Readings from Last 3 Encounters:  05/06/19 165 lb (74.8 kg)  11/30/18 163 lb (73.9 kg)   Temp Readings from Last 3 Encounters:  05/06/19 98.2 F (36.8 C) (Oral)   BP Readings from Last 3 Encounters:  05/06/19 123/83  11/30/18 125/75   Pulse Readings from Last 3 Encounters:  05/06/19 85  11/30/18 82   Recent Results (from the past 2160 hour(s))  CBC     Status: None   Collection Time: 04/28/19 11:19 AM  Result Value Ref Range   WBC 6.5 3.8 - 10.8 Thousand/uL   RBC 4.31 3.80 - 5.10 Million/uL   Hemoglobin 13.9 11.7 - 15.5 g/dL   HCT 41.0 35.0 - 45.0 %   MCV 95.1 80.0  - 100.0 fL   MCH 32.3 27.0 - 33.0 pg   MCHC 33.9 32.0 - 36.0 g/dL   RDW 12.3 11.0 - 15.0 %   Platelets 390 140 - 400 Thousand/uL   MPV 9.8 7.5 - 12.5 fL  COMPLETE METABOLIC PANEL WITH GFR     Status: Abnormal   Collection Time: 04/28/19 11:19 AM  Result Value Ref Range   Glucose, Bld 104 (H) 65 - 99 mg/dL    Comment: .  Fasting reference interval . For someone without known diabetes, a glucose value between 100 and 125 mg/dL is consistent with prediabetes and should be confirmed with a follow-up test. .    BUN 15 7 - 25 mg/dL   Creat 0.80 0.50 - 1.05 mg/dL    Comment: For patients >32 years of age, the reference limit for Creatinine is approximately 13% higher for people identified as African-American. .    GFR, Est Non African American 84 > OR = 60 mL/min/1.68m   GFR, Est African American 98 > OR = 60 mL/min/1.714m  BUN/Creatinine Ratio NOT APPLICABLE 6 - 22 (calc)   Sodium 137 135 - 146 mmol/L   Potassium 4.3 3.5 - 5.3 mmol/L   Chloride 101 98 - 110 mmol/L   CO2 27 20 - 32 mmol/L   Calcium 9.8 8.6 - 10.4 mg/dL   Total Protein 7.1 6.1 - 8.1 g/dL   Albumin 4.5 3.6 - 5.1 g/dL   Globulin 2.6 1.9 - 3.7 g/dL (calc)   AG Ratio 1.7 1.0 - 2.5 (calc)   Total Bilirubin 0.5 0.2 - 1.2 mg/dL   Alkaline phosphatase (APISO) 63 37 - 153 U/L   AST 13 10 - 35 U/L   ALT 20 6 - 29 U/L  TSH + free T4     Status: None   Collection Time: 04/28/19 11:19 AM  Result Value Ref Range   TSH W/REFLEX TO FT4 1.81 mIU/L    Comment:           Reference Range .           > or = 20 Years  0.40-4.50 .                Pregnancy Ranges           First trimester    0.26-2.66           Second trimester   0.55-2.73           Third trimester    0.43-2.91   Lipid Panel w/reflex Direct LDL     Status: Abnormal   Collection Time: 04/28/19 11:19 AM  Result Value Ref Range   Cholesterol 255 (H) <200 mg/dL   HDL 46 (L) > OR = 50 mg/dL   Triglycerides 145 <150 mg/dL   LDL Cholesterol (Calc)  180 (H) mg/dL (calc)    Comment: Reference range: <100 . Desirable range <100 mg/dL for primary prevention;   <70 mg/dL for patients with CHD or diabetic patients  with > or = 2 CHD risk factors. . Marland KitchenDL-C is now calculated using the Martin-Hopkins  calculation, which is a validated novel method providing  better accuracy than the Friedewald equation in the  estimation of LDL-C.  MaCresenciano Genret al. JAAnnamaria Helling201610;960([id number]  (http://education.[facility].com/faq/FAQ164)    Total CHOL/HDL Ratio 5.5 (H) <5.0 (calc)   Non-HDL Cholesterol (Calc) 209 (H) <130 mg/dL (calc)    Comment: For patients with diabetes plus 1 major ASCVD risk  factor, treating to a non-HDL-C goal of <100 mg/dL  (LDL-C of <70 mg/dL) is considered a therapeutic  option.    The 10-year ASCVD risk score (GMikey BussingC Jr., et al., 2013) is: 3.3%   Values used to calculate the score:     Age: [age]     Sex: Female     Is Non-Hispanic African American: No     Diabetic: No     Tobacco smoker: No     Systolic Blood  Pressure: 123 mmHg     Is BP treated: Yes     HDL Cholesterol: 46 mg/dL     Total Cholesterol: 255 mg/dL   No results found for this or any previous visit (from the past 72 hour(s)). No results found.    Assessment and Plan: 53 y.o. female with   .Dolores was seen today for annual exam.  Diagnoses and all orders for this visit:  Encounter for annual physical exam  Moderate episode of recurrent major depressive disorder ([place]) -     buPROPion (WELLBUTRIN) 75 MG tablet; Take 1 tablet (75 mg total) by mouth daily for 7 days, THEN 1 tablet (75 mg total) 2 (two) times daily for 21 days.  Paresthesias -     Sed Rate (ESR) -     C-reactive protein -     B12 and Folate Panel -     CK -     B. burgdorfi antibodies -     HIV antibody (with reflex) -     RPR -     Vitamin D 1,25 dihydroxy -     ANA  Bilateral plantar fasciitis  General weakness -     Sed Rate (ESR) -     C-reactive  protein -     B12 and Folate Panel -     CK -     B. burgdorfi antibodies -     HIV antibody (with reflex) -     RPR -     Vitamin D 1,25 dihydroxy -     ANA  Chronic fatigue -     Sed Rate (ESR) -     C-reactive protein -     B12 and Folate Panel -     CK -     B. burgdorfi antibodies -     HIV antibody (with reflex) -     RPR -     Vitamin D 1,25 dihydroxy -     ANA  Cognitive changes -     Sed Rate (ESR) -     C-reactive protein -     B12 and Folate Panel -     CK -     B. burgdorfi antibodies -     HIV antibody (with reflex) -     RPR -     Vitamin D 1,25 dihydroxy -     ANA  Need for shingles vaccine -     Varicella-zoster vaccine IM  Mixed hyperlipidemia  Statin declined  History of anaphylaxis -     EPINEPHrine 0.3 mg/0.3 mL IJ SOAJ injection; Inject 0.3 mLs (0.3 mg total) into the muscle once as needed (anaphylaxis/allergic reaction).   - Personally reviewed PMH, PSH, PFH, medications, allergies, HM - Age-appropriate cancer screening: Pap UTD, overdue for mammo (ordered 11/2018) - Tdap UTD - Shingrix given today - PHQ2 negative - Routine fasting labs completed 04/28/19 and results reviewed again with patient at appointment today - Mixed HLD: LDL>160, recommended statin, patient declined, counseled on therapeutic lifestyle changes  For heel pain: - wear nighttime plantar fasciitis splints for 1 month - do rehab exercises daily - wear orthotics in shoes and avoid barefoot walking - follow-up with Sports Medicine in 1 month if no better  For depression/fatigue: - start Wellbutrin 75 mg daily - option to increase to twice a day after 1 week (may cause sleep disturbance so take 2nd dose in the early-mid-afternoon) - aim to walk for at least 5 minutes outside every  day to get some vitamin D and exercise. Gradually increase duration as tolerated  For weakness/tingling/muscle cramps: - Labs today to r/o inflammatory arthritis, lupus, lyme, vitamin  deficiency   Patient education and anticipatory guidance given Patient agrees with treatment plan Follow-up in 4 weeks for depression/fatigue/weakness or sooner as needed if symptoms worsen or fail to improve  Darlyne Russian PA-C

## 2019-05-07 DIAGNOSIS — R5382 Chronic fatigue, unspecified: Secondary | ICD-10-CM | POA: Diagnosis not present

## 2019-05-07 DIAGNOSIS — R202 Paresthesia of skin: Secondary | ICD-10-CM | POA: Diagnosis not present

## 2019-05-07 DIAGNOSIS — R4189 Other symptoms and signs involving cognitive functions and awareness: Secondary | ICD-10-CM | POA: Diagnosis not present

## 2019-05-07 DIAGNOSIS — R531 Weakness: Secondary | ICD-10-CM | POA: Diagnosis not present

## 2019-05-11 ENCOUNTER — Other Ambulatory Visit: Payer: Self-pay | Admitting: Osteopathic Medicine

## 2019-05-11 DIAGNOSIS — R768 Other specified abnormal immunological findings in serum: Secondary | ICD-10-CM

## 2019-05-13 ENCOUNTER — Encounter: Payer: Self-pay | Admitting: Physician Assistant

## 2019-05-13 LAB — ANTI-NUCLEAR AB-TITER (ANA TITER): ANA Titer 1: 1:80 {titer} — ABNORMAL HIGH

## 2019-05-13 LAB — SEDIMENTATION RATE: Sed Rate: 29 mm/h (ref 0–30)

## 2019-05-13 LAB — CK: Total CK: 40 U/L (ref 29–143)

## 2019-05-13 LAB — HIV ANTIBODY (ROUTINE TESTING W REFLEX): HIV 1&2 Ab, 4th Generation: NONREACTIVE

## 2019-05-13 LAB — B. BURGDORFI ANTIBODIES: B burgdorferi Ab IgG+IgM: <0.9 index

## 2019-05-13 LAB — VITAMIN D 1,25 DIHYDROXY
Vitamin D 1, 25 (OH)2 Total: 52 pg/mL (ref 18–72)
Vitamin D2 1, 25 (OH)2: <8 pg/mL
Vitamin D3 1, 25 (OH)2: 52 pg/mL

## 2019-05-13 LAB — C-REACTIVE PROTEIN: CRP: 13.8 mg/L — ABNORMAL HIGH (ref <8.0)

## 2019-05-13 LAB — B12 AND FOLATE PANEL
Folate: 18 ng/mL
Vitamin B-12: 1965 pg/mL — ABNORMAL HIGH (ref 200–1100)

## 2019-05-13 LAB — RPR: RPR Ser Ql: NONREACTIVE

## 2019-05-13 LAB — ANA: Anti Nuclear Antibody (ANA): POSITIVE — AB

## 2019-05-13 NOTE — Telephone Encounter (Signed)
I have already placed a referral to rheumatology.  If patient has additional questions would recommend virtual visit with PCP once she is back in town.  Can we set this up?  Thanks!

## 2019-06-02 NOTE — Progress Notes (Signed)
Office Visit Note  Patient: Shannon Santiago             Date of Birth: 22-Feb-1966           MRN: [id number]             PCP: Trixie Dredge, PA-C Referring: Emeterio Reeve, DO Visit Date: 06/16/2019 Occupation: Registration at ED  Subjective:  Generalized pain.   History of Present Illness: Shannon Santiago is a 53 y.o. female seen in consultation per request of her PCP.  According to the patient her symptoms are started when she was in her 18s.  She states she will have painful attacks to the point that she will curl up in a ball and these episodes will last anywhere from few weeks to few months.  She states she was very active and used to participate in taekwondo at the time.  She states over the years the attacks became milder although she notices a flare with the artificial sweeteners or increased emotional stress.  She states she has seen several physicians over the years and was given antidepressants which did not help much.  She states she was seen by her PCP for new patient appointment and then follow-up visit in July at that time she was having a flare with increased fatigue and increased pain.  She states her knee pain usually on 0-10 is about 9.  She was placed on Cymbalta about 2 weeks ago.  She has noticed remarkable improvement in her pain and her depression.  She states her pain level on the 0-10 has been around 6.  Recently she has been having some discomfort in her neck and shoulder area which is causing most of her pain.  She has chronic insomnia.  Her insomnia persist.  She states she cannot take muscle relaxers as they make her drowsy.  She states she gets occasional swelling in her joints which she describes over her wrist, fingers and her knees.  She notices a rash on her face.  Activities of Daily Living:  Patient reports morning stiffness for 2-3 hours.   Patient Reports nocturnal pain.  Difficulty dressing/grooming: Reports Difficulty climbing stairs:  Reports Difficulty getting out of chair: Reports Difficulty using hands for taps, buttons, cutlery, and/or writing: Reports  Review of Systems  Constitutional: Positive for fatigue. Negative for night sweats, weight gain and weight loss.  HENT: Positive for mouth sores, mouth dryness and nose dryness. Negative for trouble swallowing and trouble swallowing.   Eyes: Positive for dryness. Negative for pain, redness, itching and visual disturbance.  Respiratory: Negative for cough, shortness of breath and difficulty breathing.   Cardiovascular: Negative for chest pain, palpitations, hypertension, irregular heartbeat and swelling in legs/feet.  Gastrointestinal: Positive for constipation. Negative for blood in stool and diarrhea.  Endocrine: Negative for increased urination.  Genitourinary: Positive for difficulty urinating. Negative for painful urination and vaginal dryness.  Musculoskeletal: Positive for arthralgias, joint pain, morning stiffness and muscle tenderness. Negative for joint swelling, myalgias, muscle weakness and myalgias.  Skin: Positive for rash and hair loss. Negative for color change, redness, skin tightness, ulcers and sensitivity to sunlight.  Allergic/Immunologic: Negative for susceptible to infections.  Neurological: Positive for headaches and weakness. Negative for dizziness, numbness, memory loss and night sweats.  Hematological: Negative for bruising/bleeding tendency and swollen glands.  Psychiatric/Behavioral: Positive for depressed mood and sleep disturbance. Negative for confusion. The patient is nervous/anxious.     PMFS History:  Patient Active Problem List   Diagnosis  Date Noted  . Plantar fasciitis, right 06/03/2019  . Fasting hyperglycemia 05/03/2019  . Hashimoto's thyroiditis 11/30/2018  . Postmenopausal 11/30/2018  . Moderate episode of recurrent major depressive disorder ([place]) 11/30/2018  . Statin declined 11/30/2018  . Mixed hyperlipidemia 05/04/2015   . Moderate persistent asthma without complication 47/06/6282  . Major depressive disorder, recurrent, severe without psychotic features ([place]) 06/10/2014  . DDD (degenerative disc disease), lumbosacral 12/10/2013  . Chronic headaches 11/24/2013  . Hypertension goal BP (blood pressure) < 130/80 11/24/2013    Past Medical History:  Diagnosis Date  . Allergy   . Anxiety   . Depression   . Hyperlipidemia   . Hypertension   . Thyroid disease     Family History  Problem Relation Age of Onset  . Hypertension Mother   . Stroke Mother   . Lung cancer Mother   . Anxiety disorder Mother   . Depression Mother   . Skin cancer Father   . Prostate cancer Father   . Hypertension Father   . Diabetes Father   . Thyroid disease Maternal Aunt   . Thyroid disease Maternal Uncle   . Diabetes Brother   . Hypertension Brother    Past Surgical History:  Procedure Laterality Date  . EYE SURGERY     x 5   Social History   Social History Narrative  . Not on file   Immunization History  Administered Date(s) Administered  . Pneumococcal Polysaccharide-23 12/10/2013  . Tdap 05/05/2015  . Zoster Recombinat (Shingrix) 05/06/2019     Objective: Vital Signs: BP 129/85 (BP Location: Right Arm, Patient Position: Sitting, Cuff Size: Normal)   Pulse 77   Resp 13   Ht 5\' 1"  (1.549 m)   Wt 159 lb 12.8 oz (72.5 kg)   BMI 30.19 kg/m    Physical Exam Vitals signs and nursing note reviewed.  Constitutional:      Appearance: She is well-developed.  HENT:     Head: Normocephalic and atraumatic.  Eyes:     Conjunctiva/sclera: Conjunctivae normal.  Neck:     Musculoskeletal: Normal range of motion.  Cardiovascular:     Rate and Rhythm: Normal rate and regular rhythm.     Heart sounds: Normal heart sounds.  Pulmonary:     Effort: Pulmonary effort is normal.     Breath sounds: Normal breath sounds.  Abdominal:     General: Bowel sounds are normal.     Palpations: Abdomen is soft.   Lymphadenopathy:     Cervical: No cervical adenopathy.  Skin:    General: Skin is warm and dry.     Capillary Refill: Capillary refill takes less than 2 seconds.  Neurological:     Mental Status: She is alert and oriented to person, place, and time.  Psychiatric:        Behavior: Behavior normal.      Musculoskeletal Exam: C-spine is in good range of motion.  She is some trapezius spasm.  She has good range of motion of lumbar spine with some discomfort.  Shoulder joints, elbow joints, wrist joints, MCPs and PIPs with good range of motion with no synovitis.  Hip joints, knee joints, ankles, MTPs with good range of motion with no synovitis.  She has some hyperalgesia.  CDAI Exam: CDAI Score: - Patient Global: -; Provider Global: - Swollen: -; Tender: - Joint Exam   No joint exam has been documented for this visit   There is currently no information documented on the homunculus. Go  to the Rheumatology activity and complete the homunculus joint exam.  Investigation: Findings:  05/07/19: ANA 1:80 NH, CRP 13.8, sed rate 29, CK 40, Vitamin D 52, Vitamin B12 1,965, folate 18, B burgdorferi Ab-, HIV-, RPR-  Component     Latest Ref Rng & Units 05/07/2019  Vitamin D 1, 25 (OH) Total     18 - 72 pg/mL 52  Vitamin D3 1, 25 (OH)     pg/mL 52  Vitamin D2 1, 25 (OH)     pg/mL <8  Vitamin B12     200 - 1,100 pg/mL 1,965 (H)  Folate     ng/mL 18.0  ANA Titer 1     titer 1:80 (H)  ANA Pattern 1      Nuclear, Homogeneous (A)  Sed Rate     0 - 30 mm/h 29  CRP     <8.0 mg/L 13.8 (H)  CK Total     29 - 143 U/L 40  B burgdorferi Ab IgG+IgM     index <0.90  HIV     NON-REACTI NON-REACTIVE  RPR     NON-REACTI NON-REACTIVE  Anti Nuclear Antibody (ANA)     NEGATIVE POSITIVE (A)   Imaging: No results found.  Recent Labs: Lab Results  Component Value Date   WBC 6.5 04/28/2019   HGB 13.9 04/28/2019   PLT 390 04/28/2019   NA 137 04/28/2019   K 4.3 04/28/2019   CL 101 04/28/2019    CO2 27 04/28/2019   GLUCOSE 104 (H) 04/28/2019   BUN 15 04/28/2019   CREATININE 0.80 04/28/2019   BILITOT 0.5 04/28/2019   AST 13 04/28/2019   ALT 20 04/28/2019   PROT 7.1 04/28/2019   CALCIUM 9.8 04/28/2019   GFRAA 98 04/28/2019    Speciality Comments: No specialty comments available.  Procedures:  No procedures performed Allergies: Morphine and related, Peanut oil, Penicillins, Polymyxin b-trimethoprim, Citalopram, Food, and Singulair [montelukast sodium]   Assessment / Plan:     Visit Diagnoses: Positive ANA (antinuclear antibody) - 05/07/19: ANA 1:80 NH, CRP 13.8, sed rate 29, CK 40, Vitamin D 52, Vitamin B12 1,965, folate 18, B burgdorferi Ab-, HIV-, RPR- -patient has positive ANA.  The titer is low.  It can be also seen with Hashimoto's thyroiditis.  She also gives history of joint pain and intermittent joint swelling.  She gives history of infrequent oral ulcers and rash.  I will obtain additional labs to look for any underlying autoimmune process.  Plan: Urinalysis, Routine w reflex microscopic, Anti-scleroderma antibody, RNP Antibody, Anti-Smith antibody, Sjogrens syndrome-A extractable nuclear antibody, Sjogrens syndrome-B extractable nuclear antibody, Anti-DNA antibody, double-stranded, C3 and C4  Pain in both hands -she does history of intermittent joint pain and swelling in her hands.  No synovitis was noted on examination today.  Plan: XR Hand 2 View Right, XR Hand 2 View Left, x-ray revealed mild osteoarthritic changes.  Rheumatoid factor, Cyclic citrul peptide antibody, IgG  Pain in both feet -she complains of pain in her feet with intermittent swelling.  No synovitis was noted.  Plan: XR Foot 2 Views Right, XR Foot 2 Views Left.  The x-ray of bilateral feet were consistent with osteoarthritis.  DDD (degenerative disc disease), lumbosacral-she has chronic discomfort.  Myalgia-she complains of generalized muscle pain and muscle spasms.  She had neck spasm on examination  today.  She is unable to take muscle relaxers due to the drowsy effect.  I believe she has a component of myofascial pain syndrome.  She had remarkable improvement with Cymbalta.  Other fatigue -patient gives history of chronic insomnia despite being on medications.  Plan: Glucose 6 phosphate dehydrogenase  Other medical problems are listed as follows:  Moderate persistent asthma without complication  Hashimoto's thyroiditis  Postmenopausal  Moderate episode of recurrent major depressive disorder ([place])  Mixed hyperlipidemia  Essential hypertension  Orders: Orders Placed This Encounter  Procedures  . XR Hand 2 View Right  . XR Hand 2 View Left  . XR Foot 2 Views Right  . XR Foot 2 Views Left  . Urinalysis, Routine w reflex microscopic  . Rheumatoid factor  . Cyclic citrul peptide antibody, IgG  . Anti-scleroderma antibody  . RNP Antibody  . Anti-Smith antibody  . Sjogrens syndrome-A extractable nuclear antibody  . Sjogrens syndrome-B extractable nuclear antibody  . Anti-DNA antibody, double-stranded  . C3 and C4  . Glucose 6 phosphate dehydrogenase   No orders of the defined types were placed in this encounter.   Face-to-face time spent with patient was 45 minutes. Greater than 50% of time was spent in counseling and coordination of care.  Follow-Up Instructions: Return for DDD, positive ANA.   Bo Merino, MD  Note - This record has been created using Editor, commissioning.  Chart creation errors have been sought, but may not always  have been located. Such creation errors do not reflect on  the standard of medical care.

## 2019-06-03 ENCOUNTER — Other Ambulatory Visit: Payer: Self-pay

## 2019-06-03 ENCOUNTER — Encounter: Payer: Self-pay | Admitting: Physician Assistant

## 2019-06-03 ENCOUNTER — Ambulatory Visit (INDEPENDENT_AMBULATORY_CARE_PROVIDER_SITE_OTHER): Payer: 59 | Admitting: Physician Assistant

## 2019-06-03 VITALS — BP 130/89 | HR 80 | Temp 98.5°F | Wt 164.0 lb

## 2019-06-03 DIAGNOSIS — F331 Major depressive disorder, recurrent, moderate: Secondary | ICD-10-CM | POA: Diagnosis not present

## 2019-06-03 DIAGNOSIS — I10 Essential (primary) hypertension: Secondary | ICD-10-CM | POA: Diagnosis not present

## 2019-06-03 DIAGNOSIS — M722 Plantar fascial fibromatosis: Secondary | ICD-10-CM | POA: Insufficient documentation

## 2019-06-03 DIAGNOSIS — R7301 Impaired fasting glucose: Secondary | ICD-10-CM

## 2019-06-03 DIAGNOSIS — E063 Autoimmune thyroiditis: Secondary | ICD-10-CM | POA: Diagnosis not present

## 2019-06-03 DIAGNOSIS — M542 Cervicalgia: Secondary | ICD-10-CM | POA: Diagnosis not present

## 2019-06-03 LAB — POCT GLYCOSYLATED HEMOGLOBIN (HGB A1C): Hemoglobin A1C: 5.2 % (ref 4.0–5.6)

## 2019-06-03 MED ORDER — LEVOTHYROXINE SODIUM 50 MCG PO TABS: 50.0000 ug | ORAL_TABLET | Freq: Every day | ORAL | 3 refills | Status: DC

## 2019-06-03 MED ORDER — BUPROPION HCL 75 MG PO TABS: 75.0000 mg | ORAL_TABLET | Freq: Two times a day (BID) | ORAL | 1 refills | Status: DC

## 2019-06-03 MED ORDER — BACLOFEN 10 MG PO TABS: 10.0000 mg | ORAL_TABLET | Freq: Three times a day (TID) | ORAL | 2 refills | Status: DC | PRN

## 2019-06-03 MED ORDER — LISINOPRIL 20 MG PO TABS: 20.0000 mg | ORAL_TABLET | Freq: Every day | ORAL | 1 refills | Status: DC

## 2019-06-03 MED ORDER — DULOXETINE HCL 30 MG PO CPEP: 30.0000 mg | ORAL_CAPSULE | Freq: Every day | ORAL | 2 refills | Status: DC

## 2019-06-03 MED ORDER — AMLODIPINE BESYLATE 5 MG PO TABS: 5.0000 mg | ORAL_TABLET | Freq: Every day | ORAL | 1 refills | Status: DC

## 2019-06-03 MED FILL — DULoxetine HCL 30 MG CPEP: 30 | 30 days supply | Qty: 30 | Fill #0

## 2019-06-03 MED FILL — BACLOFEN 10 MG TABS: 10 | 20 days supply | Qty: 60 | Fill #0

## 2019-06-03 MED FILL — buPROPion HCL 75 MG TABS: 75 | 90 days supply | Qty: 180 | Fill #0

## 2019-06-03 NOTE — Assessment & Plan Note (Signed)
History of metatarsalgia with capsulitis that responded well to injections, has never had a plantar fascia injection, pain is severe, localized at the plantar fascial origin, injected today. Referral for custom orthotics. Continue rehab exercises, return to see me in 4 to 6 weeks.

## 2019-06-03 NOTE — Progress Notes (Signed)
HPI:                                                                Shannon Santiago is a 53 y.o. female who presents to [facility]: [place] today for follow-up  Bilateral heel pain: onset several months ago, gradually worsening. Worse on the right side. States heels are very painful to walk on and feel swollen. Cannot walk barefoot due to pain.  She purchased orthotics and nighttime splints and has been using these for a month. Reports swelling and pain has decreased. ON right foot on the inner heal there is one area of exquisite point tenderness that is worse with rehab exercises involving direct palpation.  Weakness/Fatigue/Depression: endorses generalized weakness and "extreme fatigue" for several months. States she has "lost muscle tone" and has been unable to exercise or do any body weight training due to symptoms. Endorses chills alternating with sweating. Endorses parethesias in bilateral hands and feet that seem worse in the morning. Occasional headaches and "lack of mental clarity" with occasional confusion. She self-titrated Wellbutrin to 75 mg twice a day. She states it has helped with the mental fog, but she is still having concentration difficulties and depression is "really bad." Reports she is more aware of how bad it really is.    She states she has had recurrent painful attacks since her 30's. She states she has all over muscle and joint pain that is worse in her neck, shoulders and hips. She states she gets visible swelling in hands, knees and top of her scalp. She has noted triggers include artificial sweeteners and emotional upset. Otherwise it happens randomly. She had a mildly positive ANA with low titer and elevated CRP (13.8). She has an appointment with Rheumatology She does endorse moderate, persistent neck pain today. Denies upper extremity weakness, paresthesias or altered sensation.   Depression screen [facility] 2/9 06/03/2019  05/06/2019 11/30/2018  Decreased Interest '3 2 2  '$ Down, Depressed, Hopeless '3 2 3  '$ PHQ - 2 Score '6 4 5  '$ Altered sleeping '3 3 3  '$ Tired, decreased energy '3 3 3  '$ Change in appetite '1 2 2  '$ Feeling bad or failure about yourself  '2 2 3  '$ Trouble concentrating '3 2 2  '$ Moving slowly or fidgety/restless 1 0 0  Suicidal thoughts 0 0 0  PHQ-9 Score '19 16 18  '$ Difficult doing work/chores Very difficult - -    GAD 7 : Generalized Anxiety Score 06/03/2019 05/06/2019 11/30/2018  Nervous, Anxious, on Edge '2 3 3  '$ Control/stop worrying '2 2 3  '$ Worry too much - different things '2 1 3  '$ Trouble relaxing '3 2 3  '$ Restless '2 1 2  '$ Easily annoyed or irritable '2 3 2  '$ Afraid - awful might happen '2 3 2  '$ Total GAD 7 Score '15 15 18  '$ Anxiety Difficulty Somewhat difficult - -      Past Medical History:  Diagnosis Date  . Allergy   . Anxiety   . Depression   . Hyperlipidemia   . Hypertension   . Thyroid disease    Past Surgical History:  Procedure Laterality Date  . EYE SURGERY     x 5   Social History   Tobacco Use  . Smoking  status: Never Smoker  . Smokeless tobacco: Never Used  Substance Use Topics  . Alcohol use: Yes    Alcohol/week: 2.0 standard drinks    Types: 2 Standard drinks or equivalent per week   family history includes Anxiety disorder in her mother; Depression in her mother; Diabetes in her father; Hypertension in her mother; Lung cancer in her mother; Prostate cancer in her father; Skin cancer in her father; Stroke in her mother; Thyroid disease in her maternal aunt and maternal uncle.    ROS: negative except as noted in the HPI  Medications: Current Outpatient Medications  Medication Sig Dispense Refill  . Albuterol Sulfate (PROAIR RESPICLICK) 025 (90 Base) MCG/ACT AEPB Inhale 1-2 puffs into the lungs every 4 (four) hours as needed. 1 each 4  . amLODipine (NORVASC) 5 MG tablet Take 1 tablet (5 mg total) by mouth daily. 90 tablet 1  . Ascorbic Acid (VITAMIN C) 1000 MG tablet Take  1,000 mg by mouth daily.    Marland Kitchen b complex vitamins tablet Take 1 tablet by mouth daily.    . baclofen (LIORESAL) 10 MG tablet Take 1 tablet (10 mg total) by mouth 3 (three) times daily as needed for muscle spasms (headache). 60 each 2  . buPROPion (WELLBUTRIN) 75 MG tablet Take 1 tablet (75 mg total) by mouth 2 (two) times daily. 180 tablet 1  . CALCIUM PO Take by mouth.    . Cholecalciferol (VITAMIN D3) 10 MCG (400 UNIT) tablet Take 400 Units by mouth daily.    . DULoxetine (CYMBALTA) 30 MG capsule Take 1 capsule (30 mg total) by mouth daily. 30 capsule 2  . EPINEPHrine 0.3 mg/0.3 mL IJ SOAJ injection Inject 0.3 mLs (0.3 mg total) into the muscle once as needed (anaphylaxis/allergic reaction). 1 each 3  . EVENING PRIMROSE OIL PO Take by mouth.    . Ferrous Sulfate (IRON PO) Take by mouth.    . fexofenadine (ALLEGRA) 180 MG tablet Take by mouth.    . Ginger, Zingiber officinalis, (GINGER ROOT PO) Take by mouth.    . levothyroxine (SYNTHROID) 50 MCG tablet Take 1 tablet (50 mcg total) by mouth daily before breakfast. 90 tablet 3  . lisinopril (ZESTRIL) 20 MG tablet Take 1 tablet (20 mg total) by mouth daily. 90 tablet 1  . Magnesium 500 MG CAPS Take by mouth.    . Multiple Minerals (MULTI-MINERALS PO) Take by mouth.    . Multiple Vitamin (MULTIVITAMIN) capsule Take 1 capsule by mouth daily.    . Omega-3 Fatty Acids (FISH OIL) 1000 MG CAPS Take by mouth.    . Probiotic Product (PROBIOTIC ADVANCED PO) Take by mouth.    . Red Yeast Rice Extract (RED YEAST RICE PO) Take by mouth.     No current facility-administered medications for this visit.    Allergies  Allergen Reactions  . Morphine And Related Other (See Comments)    Numb hands and feet  . Peanut Oil Other (See Comments)    Stomach cramping, hand/foot swelling  . Penicillins Hives  . Polymyxin B-Trimethoprim Other (See Comments), Photosensitivity and Swelling  . Singulair [Montelukast Sodium] Other (See Comments)    Mood changes,  worsening depression       Objective:  BP 130/89   Pulse 80   Temp 98.5 F (36.9 C) (Oral)   Wt 164 lb (74.4 kg)   BMI 30.00 kg/m  Gen:  alert, not ill-appearing, no distress, appropriate for age HEENT: head normocephalic without obvious abnormality, conjunctiva and cornea  clear, trachea midline Pulm: Normal work of breathing, normal phonation Neuro: alert and oriented x 3 Neck: atraumatic, no midline tenderness, tenderness and spasm of bilateral trapezial area MSK: bilateral upper extremity strength intact Psych: cooperative, depressed mood, affect mood-congruent, speech is articulate, normal rate and volume; thought processes clear and goal-directed, normal judgment, good insight, no SI   Recent Results (from the past 2160 hour(s))  CBC     Status: None   Collection Time: 04/28/19 11:19 AM  Result Value Ref Range   WBC 6.5 3.8 - 10.8 Thousand/uL   RBC 4.31 3.80 - 5.10 Million/uL   Hemoglobin 13.9 11.7 - 15.5 g/dL   HCT 41.0 35.0 - 45.0 %   MCV 95.1 80.0 - 100.0 fL   MCH 32.3 27.0 - 33.0 pg   MCHC 33.9 32.0 - 36.0 g/dL   RDW 12.3 11.0 - 15.0 %   Platelets 390 140 - 400 Thousand/uL   MPV 9.8 7.5 - 12.5 fL  COMPLETE METABOLIC PANEL WITH GFR     Status: Abnormal   Collection Time: 04/28/19 11:19 AM  Result Value Ref Range   Glucose, Bld 104 (H) 65 - 99 mg/dL    Comment: .            Fasting reference interval . For someone without known diabetes, a glucose value between 100 and 125 mg/dL is consistent with prediabetes and should be confirmed with a follow-up test. .    BUN 15 7 - 25 mg/dL   Creat 0.80 0.50 - 1.05 mg/dL    Comment: For patients >73 years of age, the reference limit for Creatinine is approximately 13% higher for people identified as African-American. .    GFR, Est Non African American 84 > OR = 60 mL/min/1.87m   GFR, Est African American 98 > OR = 60 mL/min/1.755m  BUN/Creatinine Ratio NOT APPLICABLE 6 - 22 (calc)   Sodium 137 135 - 146  mmol/L   Potassium 4.3 3.5 - 5.3 mmol/L   Chloride 101 98 - 110 mmol/L   CO2 27 20 - 32 mmol/L   Calcium 9.8 8.6 - 10.4 mg/dL   Total Protein 7.1 6.1 - 8.1 g/dL   Albumin 4.5 3.6 - 5.1 g/dL   Globulin 2.6 1.9 - 3.7 g/dL (calc)   AG Ratio 1.7 1.0 - 2.5 (calc)   Total Bilirubin 0.5 0.2 - 1.2 mg/dL   Alkaline phosphatase (APISO) 63 37 - 153 U/L   AST 13 10 - 35 U/L   ALT 20 6 - 29 U/L  TSH + free T4     Status: None   Collection Time: 04/28/19 11:19 AM  Result Value Ref Range   TSH W/REFLEX TO FT4 1.81 mIU/L    Comment:           Reference Range .           > or = 20 Years  0.40-4.50 .                Pregnancy Ranges           First trimester    0.26-2.66           Second trimester   0.55-2.73           Third trimester    0.43-2.91   Lipid Panel w/reflex Direct LDL     Status: Abnormal   Collection Time: 04/28/19 11:19 AM  Result Value Ref Range   Cholesterol 255 (H) <200 mg/dL  HDL 46 (L) > OR = 50 mg/dL   Triglycerides 145 <150 mg/dL   LDL Cholesterol (Calc) 180 (H) mg/dL (calc)    Comment: Reference range: <100 . Desirable range <100 mg/dL for primary prevention;   <70 mg/dL for patients with CHD or diabetic patients  with > or = 2 CHD risk factors. Marland Kitchen LDL-C is now calculated using the Martin-Hopkins  calculation, which is a validated novel method providing  better accuracy than the Friedewald equation in the  estimation of LDL-C.  Cresenciano Genre et al. Annamaria Helling. 4259;563(87): 2061-2068  (http://education.[facility].com/faq/FAQ164)    Total CHOL/HDL Ratio 5.5 (H) <5.0 (calc)   Non-HDL Cholesterol (Calc) 209 (H) <130 mg/dL (calc)    Comment: For patients with diabetes plus 1 major ASCVD risk  factor, treating to a non-HDL-C goal of <100 mg/dL  (LDL-C of <70 mg/dL) is considered a therapeutic  option.   Sed Rate (ESR)     Status: None   Collection Time: 05/07/19 11:25 AM  Result Value Ref Range   Sed Rate 29 0 - 30 mm/h  C-reactive protein     Status: Abnormal    Collection Time: 05/07/19 11:25 AM  Result Value Ref Range   CRP 13.8 (H) <8.0 mg/L  B12 and Folate Panel     Status: Abnormal   Collection Time: 05/07/19 11:25 AM  Result Value Ref Range   Vitamin B-12 1,965 (H) 200 - 1,100 pg/mL   Folate 18.0 ng/mL    Comment:                            Reference Range                            Low:           <3.4                            Borderline:    3.4-5.4                            Normal:        >5.4 .   CK     Status: None   Collection Time: 05/07/19 11:25 AM  Result Value Ref Range   Total CK 40 29 - 143 U/L  B. burgdorfi antibodies     Status: None   Collection Time: 05/07/19 11:25 AM  Result Value Ref Range   B burgdorferi Ab IgG+IgM <0.90 index    Comment:                    Index                Interpretation                    -----                --------------                    < 0.90               Negative                    0.90-1.09            Equivocal                    >  1.09               Positive . As recommended by the Food and Drug Administration  (FDA), all samples with positive or equivocal  results in a Borrelia burgdorferi antibody screen will be tested using a blot method. Positive or  equivocal screening test results should not be  interpreted as truly positive until verified as such  using a supplemental assay (e.g., B. burgdorferi blot). . The screening test and/or blot for B. burgdorferi  antibodies may be falsely negative in early stages of Lyme disease, including the period when erythema  migrans is apparent. Marland Kitchen   HIV antibody (with reflex)     Status: None   Collection Time: 05/07/19 11:25 AM  Result Value Ref Range   HIV 1&2 Ab, 4th Generation NON-REACTIVE NON-REACTI    Comment: HIV-1 antigen and HIV-1/HIV-2 antibodies were not detected. There is no laboratory evidence of HIV infection. Marland Kitchen PLEASE NOTE: This information has been disclosed to you from records whose confidentiality may  be protected by state law.  If your state requires such protection, then the state law prohibits you from making any further disclosure of the information without the specific written consent of the person to whom it pertains, or as otherwise permitted by law. A general authorization for the release of medical or other information is NOT sufficient for this purpose. . For additional information please refer to http://education.[facility].com/faq/FAQ106 (This link is being provided for informational/ educational purposes only.) . Marland Kitchen The performance of this assay has not been clinically validated in patients less than 38 years old. .   RPR     Status: None   Collection Time: 05/07/19 11:25 AM  Result Value Ref Range   RPR Ser Ql NON-REACTIVE NON-REACTI  Vitamin D 1,25 dihydroxy     Status: None   Collection Time: 05/07/19 11:25 AM  Result Value Ref Range   Vitamin D 1, 25 (OH)2 Total 52 18 - 72 pg/mL   Vitamin D3 1, 25 (OH)2 52 pg/mL   Vitamin D2 1, 25 (OH)2 <8 pg/mL    Comment: Marland Kitchen Vitamin D3, 1,25(OH)2 indicates both endogenous production and supplementation. Vitamin D2, 1,25(OH)2 is an indicator of exogenous sources, such as diet or supplementation.  Interpretation and therapy are based on measurement of Vitamin D,1,25(OH)2, Total. . . This test was developed and its analytical performance characteristics have been determined by [facility], [place], [place]. It has not been cleared or approved by the FDA. This assay has been validated pursuant to the CLIA regulations and is used for clinical purposes. .   ANA     Status: Abnormal   Collection Time: 05/07/19 11:25 AM  Result Value Ref Range   Anti Nuclear Antibody (ANA) POSITIVE (A) NEGATIVE    Comment: ANA IFA is a first line screen for detecting the presence of up to approximately 150 autoantibodies in various autoimmune diseases. A positive ANA IFA result is suggestive of autoimmune  disease and reflexes to titer and pattern. Further laboratory testing may be considered if clinically indicated. . For additional information, please refer to http://education.[facility].com/faq/FAQ177 (This link is being provided for informational/ educational purposes only.) .   Anti-nuclear ab-titer (ANA titer)     Status: Abnormal   Collection Time: 05/07/19 11:25 AM  Result Value Ref Range   ANA Titer 1 1:80 (H) titer    Comment: A low level ANA titer may be present in pre-clinical autoimmune diseases and normal individuals.  Reference Range                 <1:40        Negative                 1:40-1:80    Low Antibody Level                 >1:80        Elevated Antibody Level .    ANA Pattern 1 Nuclear, Homogeneous (A)     Comment: Homogeneous pattern is associated with systemic lupus erythematosus (SLE), drug-induced lupus and juvenile idiopathic arthritis. . AC-1: Homogeneous . International Consensus on ANA Patterns (https://www.hernandez-brewer.com/)   POCT HgB A1C     Status: None   Collection Time: 06/03/19 11:38 AM  Result Value Ref Range   Hemoglobin A1C 5.2 4.0 - 5.6 %   HbA1c POC (<> result, manual entry)     HbA1c, POC (prediabetic range)     HbA1c, POC (controlled diabetic range)      Assessment and Plan: 53 y.o. female with   .Shannon Santiago was seen today for follow-up.  Diagnoses and all orders for this visit:  Cervicalgia -     baclofen (LIORESAL) 10 MG tablet; Take 1 tablet (10 mg total) by mouth 3 (three) times daily as needed for muscle spasms (headache). -     DG Cervical Spine Complete  Hypertension goal BP (blood pressure) < 130/80 -     amLODipine (NORVASC) 5 MG tablet; Take 1 tablet (5 mg total) by mouth daily. -     lisinopril (ZESTRIL) 20 MG tablet; Take 1 tablet (20 mg total) by mouth daily.  Hashimoto's thyroiditis -     levothyroxine (SYNTHROID) 50 MCG tablet; Take 1 tablet (50 mcg total) by mouth daily  before breakfast.  Moderate episode of recurrent major depressive disorder (HCC) -     buPROPion (WELLBUTRIN) 75 MG tablet; Take 1 tablet (75 mg total) by mouth 2 (two) times daily. -     DULoxetine (CYMBALTA) 30 MG capsule; Take 1 capsule (30 mg total) by mouth daily.  Plantar fasciitis, right -     Ambulatory referral to Sports Medicine  Fasting hyperglycemia -     POCT HgB A1C   MDD, Anxiety   Office Visit from 06/03/2019 in [facility]  PHQ-9 Total Score  19    No acute safety issues GAD7=15 Scores are slightly worsened despite Wellbutrin. She is tolerating 75 mg bid, but has a history of tremor with higher doses She has responded well to Cymbalta in the past. Will re-start 30 mg QD  Plantar fasciitis, right Failed >4 weeks conservative measures Sports Medicine consulted today for injection (see note)  Neck pain No red flag symptoms X-ray today Home rehab exercises Muscle relaxer prn Duloxetine for comorbid depression will likely also help with myalgia/neuropathic pain Keep f/u with rheumatology  Patient education and anticipatory guidance given Patient agrees with treatment plan Follow-up in 1 month or soner as needed if symptoms worsen or fail to improve  Darlyne Russian PA-C

## 2019-06-03 NOTE — Patient Instructions (Signed)

## 2019-06-03 NOTE — Progress Notes (Signed)
Subjective:    CC: Right foot pain  HPI: This is a very pleasant 53 year old female, for some time now she has had pain on the plantar right foot, she has had metatarsalgia with capsulitis that responded well to injections, unfortunately her plantar fasciitis is continued to hurt.  She has done the rehab exercises without much improvement, pain is severe, persistent, localized without radiation.  I reviewed the past medical history, family history, social history, surgical history, and allergies today and no changes were needed.  Please see the problem list section below in epic for further details.  Past Medical History: Past Medical History:  Diagnosis Date  . Allergy   . Anxiety   . Depression   . Hyperlipidemia   . Hypertension   . Thyroid disease    Past Surgical History: Past Surgical History:  Procedure Laterality Date  . EYE SURGERY     x 5   Social History: Social History   Socioeconomic History  . Marital status: Single    Spouse name: Not on file  . Number of children: Not on file  . Years of education: Not on file  . Highest education level: Not on file  Occupational History  . Not on file  Social Needs  . Financial resource strain: Not on file  . Food insecurity    Worry: Not on file    Inability: Not on file  . Transportation needs    Medical: Not on file    Non-medical: Not on file  Tobacco Use  . Smoking status: Never Smoker  . Smokeless tobacco: Never Used  Substance and Sexual Activity  . Alcohol use: Yes    Alcohol/week: 2.0 standard drinks    Types: 2 Standard drinks or equivalent per week  . Drug use: Never  . Sexual activity: Not Currently    Birth control/protection: Abstinence  Lifestyle  . Physical activity    Days per week: Not on file    Minutes per session: Not on file  . Stress: Not on file  Relationships  . Social Herbalist on phone: Not on file    Gets together: Not on file    Attends religious service: Not on  file    Active member of club or organization: Not on file    Attends meetings of clubs or organizations: Not on file    Relationship status: Not on file  Other Topics Concern  . Not on file  Social History Narrative  . Not on file   Family History: Family History  Problem Relation Age of Onset  . Hypertension Mother   . Stroke Mother   . Lung cancer Mother   . Anxiety disorder Mother   . Depression Mother   . Diabetes Father   . Skin cancer Father   . Prostate cancer Father   . Thyroid disease Maternal Aunt   . Thyroid disease Maternal Uncle    Allergies: Allergies  Allergen Reactions  . Morphine And Related Other (See Comments)    Numb hands and feet  . Peanut Oil Other (See Comments)    Stomach cramping, hand/foot swelling  . Penicillins Hives  . Polymyxin B-Trimethoprim Other (See Comments), Photosensitivity and Swelling  . Singulair [Montelukast Sodium] Other (See Comments)    Mood changes, worsening depression   Medications: See med rec.  Review of Systems: No fevers, chills, night sweats, weight loss, chest pain, or shortness of breath.   Objective:    General: Well Developed, well  nourished, and in no acute distress.  Neuro: Alert and oriented x3, extra-ocular muscles intact, sensation grossly intact.  HEENT: Normocephalic, atraumatic, pupils equal round reactive to light, neck supple, no masses, no lymphadenopathy, thyroid nonpalpable.  Skin: Warm and dry, no rashes. Cardiac: Regular rate and rhythm, no murmurs rubs or gallops, no lower extremity edema.  Respiratory: Clear to auscultation bilaterally. Not using accessory muscles, speaking in full sentences. Right foot: No visible erythema or swelling. Range of motion is full in all directions. Strength is 5/5 in all directions. No hallux valgus. No pes cavus or pes planus. No abnormal callus noted. No pain over the navicular prominence, or base of fifth metatarsal. Severe tenderness to palpation of the  calcaneal insertion of plantar fascia. No pain at the Achilles insertion. No pain over the calcaneal bursa. No pain of the retrocalcaneal bursa. No tenderness to palpation over the tarsals, metatarsals, or phalanges. No hallux rigidus or limitus. No tenderness palpation over interphalangeal joints. No pain with compression of the metatarsal heads. Neurovascularly intact distally.  Procedure: Real-time Ultrasound Guided injection of the right plantar fascia Device: GE Logiq E  Verbal informed consent obtained.  Time-out conducted.  Noted no overlying erythema, induration, or other signs of local infection.  Skin prepped in a sterile fashion.  Local anesthesia: Topical Ethyl chloride.  With sterile technique and under real time ultrasound guidance:  1 cc Kenalog 40, 1 cc lidocaine, 1 cc bupivacaine injected easily Completed without difficulty  Pain immediately resolved suggesting accurate placement of the medication.  Advised to call if fevers/chills, erythema, induration, drainage, or persistent bleeding.  Images permanently stored and available for review in the ultrasound unit.  Impression: Technically successful ultrasound guided injection.  Impression and Recommendations:    Plantar fasciitis, right History of metatarsalgia with capsulitis that responded well to injections, has never had a plantar fascia injection, pain is severe, localized at the plantar fascial origin, injected today. Referral for custom orthotics. Continue rehab exercises, return to see me in 4 to 6 weeks.   ___________________________________________ Gwen Her. Dianah Field, M.D., ABFM., CAQSM. Primary Care and Sports Medicine [facility]  Adjunct Professor of [employer]

## 2019-06-11 ENCOUNTER — Other Ambulatory Visit: Payer: Self-pay

## 2019-06-11 ENCOUNTER — Ambulatory Visit (INDEPENDENT_AMBULATORY_CARE_PROVIDER_SITE_OTHER): Payer: 59 | Admitting: Family Medicine

## 2019-06-11 DIAGNOSIS — M722 Plantar fascial fibromatosis: Secondary | ICD-10-CM | POA: Diagnosis not present

## 2019-06-11 NOTE — Assessment & Plan Note (Signed)
Has tried over the counter insoles with limited improvement.  - custom orthotics  - counseled on HEP and supportive care - given indications for adjustments. Consider adding metatarsal pads.

## 2019-06-11 NOTE — Progress Notes (Signed)
Jalessa Renna - 53 y.o. female MRN [id number]  Date of birth: [birth year]  SUBJECTIVE:  Including CC & ROS.  Chief Complaint  Patient presents with  . Foot Orthotics    Aerilyn Caparas is a 53 y.o. female that is presenting with metatarsalgia, plantar fasciitis and to have orthotics made.  She has received injections for the plantar fasciitis which seems to improve her symptoms.  She has a history of metatarsalgia and has had different over-the-counter insoles.  She has had some relief with these.  She is also tried metatarsal pads.  Feels like the symptoms are intermittent.    Review of Systems  Constitutional: Negative for fever.  HENT: Negative for congestion.   Respiratory: Negative for cough.   Cardiovascular: Negative for chest pain.  Gastrointestinal: Negative for abdominal pain.  Musculoskeletal: Negative for joint swelling.  Skin: Negative for color change.  Neurological: Negative for weakness.  Hematological: Negative for adenopathy.    HISTORY: Past Medical, Surgical, Social, and Family History Reviewed & Updated per EMR.   Pertinent Historical Findings include:  Past Medical History:  Diagnosis Date  . Allergy   . Anxiety   . Depression   . Hyperlipidemia   . Hypertension   . Thyroid disease     Past Surgical History:  Procedure Laterality Date  . EYE SURGERY     x 5    Allergies  Allergen Reactions  . Morphine And Related Other (See Comments)    Numb hands and feet  . Peanut Oil Other (See Comments)    Stomach cramping, hand/foot swelling  . Penicillins Hives  . Polymyxin B-Trimethoprim Other (See Comments), Photosensitivity and Swelling  . Singulair [Montelukast Sodium] Other (See Comments)    Mood changes, worsening depression    Family History  Problem Relation Age of Onset  . Hypertension Mother   . Stroke Mother   . Lung cancer Mother   . Anxiety disorder Mother   . Depression Mother   . Diabetes Father   . Skin cancer Father   .  Prostate cancer Father   . Thyroid disease Maternal Aunt   . Thyroid disease Maternal Uncle      Social History   Socioeconomic History  . Marital status: Single    Spouse name: Not on file  . Number of children: Not on file  . Years of education: Not on file  . Highest education level: Not on file  Occupational History  . Not on file  Social Needs  . Financial resource strain: Not on file  . Food insecurity    Worry: Not on file    Inability: Not on file  . Transportation needs    Medical: Not on file    Non-medical: Not on file  Tobacco Use  . Smoking status: Never Smoker  . Smokeless tobacco: Never Used  Substance and Sexual Activity  . Alcohol use: Yes    Alcohol/week: 2.0 standard drinks    Types: 2 Standard drinks or equivalent per week  . Drug use: Never  . Sexual activity: Not Currently    Birth control/protection: Abstinence  Lifestyle  . Physical activity    Days per week: Not on file    Minutes per session: Not on file  . Stress: Not on file  Relationships  . Social Herbalist on phone: Not on file    Gets together: Not on file    Attends religious service: Not on file    Active member of club  or organization: Not on file    Attends meetings of clubs or organizations: Not on file    Relationship status: Not on file  . Intimate partner violence    Fear of current or ex partner: Not on file    Emotionally abused: Not on file    Physically abused: Not on file    Forced sexual activity: Not on file  Other Topics Concern  . Not on file  Social History Narrative  . Not on file     PHYSICAL EXAM:  VS: There were no vitals taken for this visit. Physical Exam Gen: NAD, alert, cooperative with exam, well-appearing ENT: normal lips, normal nasal mucosa,  Eye: normal EOM, normal conjunctiva and lids CV:  no edema, +2 pedal pulses   Resp: no accessory muscle use, non-labored,   Skin: no rashes, no areas of induration  Neuro: normal tone,  normal sensation to touch Psych:  normal insight, alert and oriented MSK:  Left and Right foot:   Loss of the longitudinal arch and transverse arch  Normal ankle ROM  Normal strength to resistance Normal rise on tip toes.  NVI.    Patient was fitted for a standard, cushioned, semi-rigid orthotic. The orthotic was heated and afterward the patient stood on the orthotic blank positioned on the orthotic stand. The patient was positioned in subtalar neutral position and 10 degrees of ankle dorsiflexion in a weight bearing stance. After completion of molding, a stable base was applied to the orthotic blank. The blank was ground to a stable position for weight bearing. Size: men's 7  Base: Blue EVA Additional Posting and Padding: None The patient ambulated these, and they were very comfortable.      ASSESSMENT & PLAN:   Plantar fasciitis, right Has tried over the counter insoles with limited improvement.  - custom orthotics  - counseled on HEP and supportive care - given indications for adjustments. Consider adding metatarsal pads.

## 2019-06-11 NOTE — Patient Instructions (Signed)
Nice to meet you Please try to stretch during your shift  Please send me a message in MyChart with any questions or updates.  Please see [place] back if we need to make any modifications.   --Dr. Raeford Razor

## 2019-06-15 ENCOUNTER — Encounter: Payer: Self-pay | Admitting: Physician Assistant

## 2019-06-16 ENCOUNTER — Other Ambulatory Visit: Payer: Self-pay

## 2019-06-16 ENCOUNTER — Ambulatory Visit (INDEPENDENT_AMBULATORY_CARE_PROVIDER_SITE_OTHER): Payer: 59 | Admitting: Rheumatology

## 2019-06-16 ENCOUNTER — Encounter: Payer: Self-pay | Admitting: Rheumatology

## 2019-06-16 ENCOUNTER — Ambulatory Visit (INDEPENDENT_AMBULATORY_CARE_PROVIDER_SITE_OTHER): Payer: 59

## 2019-06-16 ENCOUNTER — Ambulatory Visit: Payer: Self-pay

## 2019-06-16 VITALS — BP 129/85 | HR 77 | Resp 13 | Ht 61.0 in | Wt 159.8 lb

## 2019-06-16 DIAGNOSIS — E063 Autoimmune thyroiditis: Secondary | ICD-10-CM

## 2019-06-16 DIAGNOSIS — R5383 Other fatigue: Secondary | ICD-10-CM | POA: Diagnosis not present

## 2019-06-16 DIAGNOSIS — R768 Other specified abnormal immunological findings in serum: Secondary | ICD-10-CM | POA: Diagnosis not present

## 2019-06-16 DIAGNOSIS — M79671 Pain in right foot: Secondary | ICD-10-CM

## 2019-06-16 DIAGNOSIS — I10 Essential (primary) hypertension: Secondary | ICD-10-CM

## 2019-06-16 DIAGNOSIS — Z780 Asymptomatic menopausal state: Secondary | ICD-10-CM | POA: Diagnosis not present

## 2019-06-16 DIAGNOSIS — M79642 Pain in left hand: Secondary | ICD-10-CM | POA: Diagnosis not present

## 2019-06-16 DIAGNOSIS — J4540 Moderate persistent asthma, uncomplicated: Secondary | ICD-10-CM | POA: Diagnosis not present

## 2019-06-16 DIAGNOSIS — M79641 Pain in right hand: Secondary | ICD-10-CM | POA: Diagnosis not present

## 2019-06-16 DIAGNOSIS — F331 Major depressive disorder, recurrent, moderate: Secondary | ICD-10-CM

## 2019-06-16 DIAGNOSIS — M79672 Pain in left foot: Secondary | ICD-10-CM | POA: Diagnosis not present

## 2019-06-16 DIAGNOSIS — E782 Mixed hyperlipidemia: Secondary | ICD-10-CM

## 2019-06-16 DIAGNOSIS — M791 Myalgia, unspecified site: Secondary | ICD-10-CM

## 2019-06-16 DIAGNOSIS — M5137 Other intervertebral disc degeneration, lumbosacral region: Secondary | ICD-10-CM | POA: Diagnosis not present

## 2019-06-17 ENCOUNTER — Encounter: Payer: Self-pay | Admitting: Physician Assistant

## 2019-06-17 LAB — URINALYSIS, ROUTINE W REFLEX MICROSCOPIC
Bacteria, UA: NONE SEEN /HPF
Bilirubin Urine: NEGATIVE
Glucose, UA: NEGATIVE
Hyaline Cast: NONE SEEN /LPF
Ketones, ur: NEGATIVE
Leukocytes,Ua: NEGATIVE
Nitrite: NEGATIVE
Protein, ur: NEGATIVE
Specific Gravity, Urine: 1.007 (ref 1.001–1.03)
Squamous Epithelial / HPF: NONE SEEN /HPF (ref <=5)
WBC, UA: NONE SEEN /HPF (ref 0–5)
pH: 6.5 (ref 5.0–8.0)

## 2019-06-17 LAB — ANTI-SMITH ANTIBODY: ENA SM Ab Ser-aCnc: <1 AI

## 2019-06-17 LAB — SJOGRENS SYNDROME-A EXTRACTABLE NUCLEAR ANTIBODY: SSA (Ro) (ENA) Antibody, IgG: <1 AI

## 2019-06-17 LAB — ANTI-SCLERODERMA ANTIBODY: Scleroderma (Scl-70) (ENA) Antibody, IgG: <1 AI

## 2019-06-17 LAB — C3 AND C4
C3 Complement: 137 mg/dL (ref 83–193)
C4 Complement: 51 mg/dL (ref 15–57)

## 2019-06-17 LAB — CYCLIC CITRUL PEPTIDE ANTIBODY, IGG: Cyclic Citrullin Peptide Ab: <16 UNITS

## 2019-06-17 LAB — ANTI-DNA ANTIBODY, DOUBLE-STRANDED: ds DNA Ab: 1 IU/mL

## 2019-06-17 LAB — GLUCOSE 6 PHOSPHATE DEHYDROGENASE: G-6PDH: 14.2 U/g Hgb (ref 7.0–20.5)

## 2019-06-17 LAB — SJOGRENS SYNDROME-B EXTRACTABLE NUCLEAR ANTIBODY: SSB (La) (ENA) Antibody, IgG: <1 AI

## 2019-06-17 LAB — RHEUMATOID FACTOR: Rheumatoid fact SerPl-aCnc: <14 IU/mL (ref <14)

## 2019-06-17 LAB — RNP ANTIBODY: Ribonucleic Protein(ENA) Antibody, IgG: <1 AI

## 2019-06-18 ENCOUNTER — Ambulatory Visit (INDEPENDENT_AMBULATORY_CARE_PROVIDER_SITE_OTHER): Payer: 59 | Admitting: Physician Assistant

## 2019-06-18 ENCOUNTER — Other Ambulatory Visit: Payer: Self-pay

## 2019-06-18 ENCOUNTER — Ambulatory Visit (INDEPENDENT_AMBULATORY_CARE_PROVIDER_SITE_OTHER): Payer: 59

## 2019-06-18 ENCOUNTER — Other Ambulatory Visit (HOSPITAL_COMMUNITY)
Admission: RE | Admit: 2019-06-18 | Discharge: 2019-06-18 | Disposition: A | Discharge status: Home / Self Care | Payer: 59 | Source: Ambulatory Visit | Attending: Physician Assistant | Admitting: Physician Assistant

## 2019-06-18 ENCOUNTER — Encounter: Payer: Self-pay | Admitting: Physician Assistant

## 2019-06-18 VITALS — BP 144/96 | HR 81 | Temp 98.1°F | Resp 14 | Wt 162.0 lb

## 2019-06-18 DIAGNOSIS — Z124 Encounter for screening for malignant neoplasm of cervix: Secondary | ICD-10-CM | POA: Diagnosis not present

## 2019-06-18 DIAGNOSIS — N950 Postmenopausal bleeding: Secondary | ICD-10-CM | POA: Diagnosis not present

## 2019-06-18 DIAGNOSIS — D252 Subserosal leiomyoma of uterus: Secondary | ICD-10-CM | POA: Diagnosis not present

## 2019-06-18 DIAGNOSIS — R102 Pelvic and perineal pain unspecified side: Secondary | ICD-10-CM

## 2019-06-18 LAB — CBC WITH DIFFERENTIAL/PLATELET
Absolute Monocytes: 817 cells/uL (ref 200–950)
Basophils Absolute: 38 cells/uL (ref 0–200)
Basophils Relative: 0.4 %
Eosinophils Absolute: 133 cells/uL (ref 15–500)
Eosinophils Relative: 1.4 %
HCT: 41.8 % (ref 35.0–45.0)
Hemoglobin: 14.2 g/dL (ref 11.7–15.5)
Lymphs Abs: 1634 cells/uL (ref 850–3900)
MCH: 33.3 pg — ABNORMAL HIGH (ref 27.0–33.0)
MCHC: 34 g/dL (ref 32.0–36.0)
MCV: 97.9 fL (ref 80.0–100.0)
MPV: 9.4 fL (ref 7.5–12.5)
Monocytes Relative: 8.6 %
Neutro Abs: 6878 cells/uL (ref 1500–7800)
Neutrophils Relative %: 72.4 %
Platelets: 415 10*3/uL — ABNORMAL HIGH (ref 140–400)
RBC: 4.27 10*6/uL (ref 3.80–5.10)
RDW: 12.6 % (ref 11.0–15.0)
Total Lymphocyte: 17.2 %
WBC: 9.5 10*3/uL (ref 3.8–10.8)

## 2019-06-18 MED ORDER — NAPROXEN 375 MG PO TABS: 375.0000 mg | ORAL_TABLET | Freq: Two times a day (BID) | ORAL | 0 refills | Status: DC

## 2019-06-18 MED FILL — NAPROXEN 375 MG TABLET: 375 | 15 days supply | Qty: 30 | Fill #0

## 2019-06-18 NOTE — Progress Notes (Signed)
HPI:                                                                Shannon Santiago is a 53 y.o. female who presents to [facility] today for AUB  Postmenopausal female with self-reported hx of polyps, (LMP June 2018), reports sudden onset vaginal bleeding x 2 days described as a heavy flow similar to a menstrual cycle. She states she is using 3 super pads per day. Endorses pelvic cramping radiating into the back Denies weakness, palpitations, shortness of breath.   Pap UTD 2018, no hx of abnormal pap smears.      Past Medical History:  Diagnosis Date  . Allergy   . Anxiety   . Depression   . Hyperlipidemia   . Hypertension   . Thyroid disease    Past Surgical History:  Procedure Laterality Date  . EYE SURGERY     x 5   Social History   Tobacco Use  . Smoking status: Never Smoker  . Smokeless tobacco: Never Used  Substance Use Topics  . Alcohol use: Yes    Comment: occ   family history includes Anxiety disorder in her mother; Depression in her mother; Diabetes in her brother and father; Hypertension in her brother, father, and mother; Lung cancer in her mother; Prostate cancer in her father; Skin cancer in her father; Stroke in her mother; Thyroid disease in her maternal aunt and maternal uncle.    ROS: negative except as noted in the HPI  Medications: Current Outpatient Medications  Medication Sig Dispense Refill  . Albuterol Sulfate (PROAIR RESPICLICK) 123XX123 (90 Base) MCG/ACT AEPB Inhale 1-2 puffs into the lungs every 4 (four) hours as needed. 1 each 4  . amLODipine (NORVASC) 5 MG tablet Take 1 tablet (5 mg total) by mouth daily. 90 tablet 1  . Ascorbic Acid (VITAMIN C) 1000 MG tablet Take 1,000 mg by mouth daily.    Marland Kitchen b complex vitamins tablet Take 1 tablet by mouth daily.    . baclofen (LIORESAL) 10 MG tablet Take 1 tablet (10 mg total) by mouth 3 (three) times daily as needed for muscle spasms (headache). 60 each  2  . buPROPion (WELLBUTRIN) 75 MG tablet Take 1 tablet (75 mg total) by mouth 2 (two) times daily. 180 tablet 1  . CALCIUM PO Take by mouth.    . Cholecalciferol (VITAMIN D3) 10 MCG (400 UNIT) tablet Take 400 Units by mouth daily.    . DULoxetine (CYMBALTA) 30 MG capsule Take 1 capsule (30 mg total) by mouth daily. 30 capsule 2  . EPINEPHrine 0.3 mg/0.3 mL IJ SOAJ injection Inject 0.3 mLs (0.3 mg total) into the muscle once as needed (anaphylaxis/allergic reaction). 1 each 3  . EVENING PRIMROSE OIL PO Take by mouth.    . Ferrous Sulfate (IRON PO) Take by mouth.    . fexofenadine (ALLEGRA) 180 MG tablet Take by mouth.    . Ginger, Zingiber officinalis, (GINGER ROOT PO) Take by mouth.    . levothyroxine (SYNTHROID) 50 MCG tablet Take 1 tablet (50 mcg total) by mouth daily before breakfast. 90 tablet 3  . lisinopril (ZESTRIL) 20 MG tablet Take 1 tablet (20 mg total) by mouth daily. 90 tablet 1  .  Magnesium 500 MG CAPS Take by mouth.    . Multiple Minerals (MULTI-MINERALS PO) Take by mouth.    . Multiple Vitamin (MULTIVITAMIN) capsule Take 1 capsule by mouth daily.    . Omega-3 Fatty Acids (FISH OIL) 1000 MG CAPS Take by mouth.    . Probiotic Product (PROBIOTIC ADVANCED PO) Take by mouth.    . Red Yeast Rice Extract (RED YEAST RICE PO) Take by mouth.     No current facility-administered medications for this visit.    Allergies  Allergen Reactions  . Morphine And Related Other (See Comments)    Numb hands and feet  . Peanut Oil Other (See Comments)    Stomach cramping, hand/foot swelling  . Penicillins Hives  . Polymyxin B-Trimethoprim Other (See Comments), Photosensitivity and Swelling  . Citalopram Other (See Comments)    SSRI's ineffective - Citalopram, Fluoxetine, Sertraline, Excitalopram   . Food Nausea Only, Swelling, Palpitations, Other (See Comments) and Cough    WHEAT  . Singulair [Montelukast Sodium] Other (See Comments)    Mood changes, worsening depression        Objective:  BP (!) 144/96   Pulse 81   Temp 98.1 F (36.7 C) (Oral)   Wt 162 lb (73.5 kg)   BMI 30.61 kg/m  Gen:  alert, not ill-appearing, no distress, appropriate for age 5: head normocephalic without obvious abnormality, conjunctiva and cornea clear, trachea midline Pulm: Normal work of breathing, normal phonation, clear to auscultation bilaterally, no wheezes, rales or rhonchi CV: Normal rate, regular rhythm, s1 and s2 distinct, no murmurs, clicks or rubs  GU: vulva without lesions, normal introitus and urethral meatus, vaginal mucosa without erythema, moderate amount of bright red blood in the vaginal vault, cervix non-friable without lesions GI: abdomen soft, there is LLQ tenderness with mild guarding, no rigidity, no palpable masses Neuro: alert and oriented x 3, no tremor MSK: extremities atraumatic, normal gait and station Skin: intact, no rashes on exposed skin, no jaundice, no cyanosis   A chaperone was present for the GU portion of the exam, Izell [place], RMA.  Lab Results  Component Value Date   CREATININE 0.80 04/28/2019   BUN 15 04/28/2019   NA 137 04/28/2019   K 4.3 04/28/2019   CL 101 04/28/2019   CO2 27 04/28/2019     Results for orders placed or performed in visit on 06/16/19 (from the past 72 hour(s))  Urinalysis, Routine w reflex microscopic     Status: Abnormal   Collection Time: 06/16/19 10:49 AM  Result Value Ref Range   Color, Urine YELLOW YELLOW   APPearance CLEAR CLEAR   Specific Gravity, Urine 1.007 1.001 - 1.03   pH 6.5 5.0 - 8.0   Glucose, UA NEGATIVE NEGATIVE   Bilirubin Urine NEGATIVE NEGATIVE   Ketones, ur NEGATIVE NEGATIVE   Hgb urine dipstick 3+ (A) NEGATIVE   Protein, ur NEGATIVE NEGATIVE   Nitrite NEGATIVE NEGATIVE   Leukocytes,Ua NEGATIVE NEGATIVE   WBC, UA NONE SEEN 0 - 5 /HPF   RBC / HPF 3-10 (A) 0 - 2 /HPF   Squamous Epithelial / LPF NONE SEEN < OR = 5 /HPF   Bacteria, UA NONE SEEN NONE SEEN /HPF   Hyaline  Cast NONE SEEN NONE SEEN /LPF  Rheumatoid factor     Status: None   Collection Time: 06/16/19 10:49 AM  Result Value Ref Range   Rhuematoid fact SerPl-aCnc 99991111 99991111 IU/mL  Cyclic citrul peptide antibody, IgG     Status: None  Collection Time: 06/16/19 10:49 AM  Result Value Ref Range   Cyclic Citrullin Peptide Ab <16 UNITS    Comment: Reference Range Negative:            <20 Weak Positive:       20-39 Moderate Positive:   40-59 Strong Positive:     >59 .   Anti-scleroderma antibody     Status: None   Collection Time: 06/16/19 10:49 AM  Result Value Ref Range   Scleroderma (Scl-70) (ENA) Antibody, IgG <1.0 NEG <1.0 NEG AI  RNP Antibody     Status: None   Collection Time: 06/16/19 10:49 AM  Result Value Ref Range   Ribonucleic Protein(ENA) Antibody, IgG <1.0 NEG <1.0 NEG AI  Anti-Smith antibody     Status: None   Collection Time: 06/16/19 10:49 AM  Result Value Ref Range   ENA SM Ab Ser-aCnc <1.0 NEG <1.0 NEG AI  Sjogrens syndrome-A extractable nuclear antibody     Status: None   Collection Time: 06/16/19 10:49 AM  Result Value Ref Range   SSA (Ro) (ENA) Antibody, IgG <1.0 NEG <1.0 NEG AI  Sjogrens syndrome-B extractable nuclear antibody     Status: None   Collection Time: 06/16/19 10:49 AM  Result Value Ref Range   SSB (La) (ENA) Antibody, IgG <1.0 NEG <1.0 NEG AI  Anti-DNA antibody, double-stranded     Status: None   Collection Time: 06/16/19 10:49 AM  Result Value Ref Range   ds DNA Ab 1 IU/mL    Comment:                            IU/mL       Interpretation                            < or = 4    Negative                            5-9         Indeterminate                            > or = 10   Positive .   C3 and C4     Status: None   Collection Time: 06/16/19 10:49 AM  Result Value Ref Range   C3 Complement 137 83 - 193 mg/dL   C4 Complement 51 15 - 57 mg/dL  Glucose 6 phosphate dehydrogenase     Status: None   Collection Time: 06/16/19 10:49 AM  Result Value  Ref Range   G-6PDH 14.2 7.0 - 20.5 U/g Hgb   Xr Foot 2 Views Left  Result Date: 06/16/2019 First MTP, PIP and DIP joint space narrowing was noted.  No other MTPs were narrow.  No erosive changes were noted.  No intertarsal, tibiotalar or subtalar joint space narrowing was noted.  Posterior inferior calcaneal spurs were noted. Impression: These findings are consistent with osteoarthritis of the foot.  Xr Foot 2 Views Right  Result Date: 06/16/2019 First MTP, PIP and DIP joint space narrowing was noted.  No erosive changes were noted.  No tibiotalar, subtalar or intertarsal joint space narrowing was noted.  Inferior p calcaneal spur was noted. Impression: These findings are consistent with osteoarthritis of the foot.  Xr Hand 2 View Left  Result Date: 06/16/2019 CMC, PIP and DIP joint space narrowing was noted.  No MCP, intercarpal radiocarpal joint space narrowing was noted.  No erosive changes were noted. Impression: These findings are consistent with osteoarthritis of the hand.  Xr Hand 2 View Right  Result Date: 06/16/2019 CMC, PIP and DIP joint space narrowing was noted.  No MCP, intercarpal radiocarpal joint space narrowing was noted.  No erosive changes were noted. Impression: These findings are consistent with osteoarthritis of the hand.     Assessment and Plan: 53 y.o. female with   .Doria was seen today for vaginal bleeding.  Diagnoses and all orders for this visit:  Postmenopausal vaginal bleeding -     Prolactin -     CP4508-PT/INR AND PTT -     CBC with Differential/Platelet -     Cytology - PAP -     US PELVIC COMPLETE WITH TRANSVAGINAL -     Ambulatory referral to Obstetrics / Gynecology  Encounter for Papanicolaou smear of cervix -     Cytology - PAP  Pelvic cramping -     naproxen (NAPROSYN) 375 MG tablet; Take 1 tablet (375 mg total) by mouth 2 (two) times daily with a meal.    She is hemodynamically stable Pelvic/TV US pending today Recent TSH and  CMP unremarkable Check CBC, PT/INR and prolactin Pap repeated today Recommended that she increase iron supplementation to tid every other day Naproxen bid prn for cramping/pain F/u with GYN next week  Patient education and anticipatory guidance given Patient agrees with treatment plan Follow-up as needed if symptoms worsen or fail to improve  Darlyne Russian PA-C

## 2019-06-18 NOTE — Patient Instructions (Signed)
  Postmenopausal Bleeding  Postmenopausal bleeding is any bleeding that a woman has after she has entered into menopause. Menopause is the end of a woman's fertile years. After menopause, a woman no longer ovulates and does not have menstrual periods. Postmenopausal bleeding may have various causes, including:  Menopausal hormone therapy (MHT).  Endometrial atrophy. After menopause, low estrogen hormone levels cause the membrane that lines the uterus (endometrium) to become thinner. You may have bleeding as the endometrium thins.  Endometrial hyperplasia. This condition is caused by excess estrogen hormones and low levels of progesterone hormones. The excess estrogen causes the endometrium to thicken, which can lead to bleeding. In some cases, this can lead to cancer of the uterus.  Endometrial cancer.  Non-cancerous growths (polyps) on the endometrium, the lining of the uterus, or the cervix.  Uterine fibroids. These are non-cancerous growths in or around the uterus muscle tissue that can cause heavy bleeding. Any type of postmenopausal bleeding, even if it appears to be a typical menstrual period, should be evaluated by your health care provider. Treatment will depend on the cause of the bleeding. Follow these instructions at home:  Pay attention to any changes in your symptoms.  Avoid using tampons and douches as told by your health care provider.  Change your pads regularly.  Get regular pelvic exams and Pap tests.  Take iron supplements as told by your health care provider.  Take over-the-counter and prescription medicines only as told by your health care provider.  Keep all follow-up visits as told by your health care provider. This is important. Contact a health care provider if:  Your bleeding lasts more than 1 week.  You have abdominal pain.  You have bleeding with or after sexual intercourse.  You have bleeding that happens more often than every 3 weeks. Get help  right away if:  You have a fever, chills, headache, dizziness, muscle aches, and bleeding.  You have severe pain with bleeding.  You are passing blood clots.  You have heavy bleeding, need more than 1 pad an hour, and have never experienced this before.  You feel faint. Summary  Postmenopausal bleeding is any bleeding that a woman has after she has entered into menopause.  Postmenopausal bleeding may have various causes. Treatment will depend on the cause of the bleeding.  Any type of postmenopausal bleeding, even if it appears to be a typical menstrual period, should be evaluated by your health care provider.  Be sure to pay attention to any changes in your symptoms and keep all follow-up visits as told by your health care provider. This information is not intended to replace advice given to you by your health care provider. Make sure you discuss any questions you have with your health care provider. Document Released: 01/15/2006 Document Revised: 01/14/2018 Document Reviewed: 12/31/2016 Elsevier Patient Education  2020 [employer]  

## 2019-06-18 NOTE — Progress Notes (Signed)
I will discuss results at the fu visit.

## 2019-06-19 LAB — CP4508-PT/INR AND PTT
INR: 1
Prothrombin Time: 10.4 s (ref 9.0–11.5)
aPTT: 42 s — ABNORMAL HIGH (ref 22–34)

## 2019-06-19 LAB — PROLACTIN: Prolactin: 10.6 ng/mL

## 2019-06-21 ENCOUNTER — Other Ambulatory Visit: Payer: Self-pay

## 2019-06-21 ENCOUNTER — Encounter: Payer: Self-pay | Admitting: Obstetrics & Gynecology

## 2019-06-21 ENCOUNTER — Ambulatory Visit (INDEPENDENT_AMBULATORY_CARE_PROVIDER_SITE_OTHER): Payer: 59 | Admitting: Obstetrics & Gynecology

## 2019-06-21 VITALS — BP 120/84 | HR 86 | Wt 160.0 lb

## 2019-06-21 DIAGNOSIS — N939 Abnormal uterine and vaginal bleeding, unspecified: Secondary | ICD-10-CM

## 2019-06-21 DIAGNOSIS — N840 Polyp of corpus uteri: Secondary | ICD-10-CM | POA: Diagnosis not present

## 2019-06-21 DIAGNOSIS — Z3202 Encounter for pregnancy test, result negative: Secondary | ICD-10-CM

## 2019-06-21 DIAGNOSIS — N858 Other specified noninflammatory disorders of uterus: Secondary | ICD-10-CM | POA: Diagnosis not present

## 2019-06-21 HISTORY — PX: ENDOMETRIAL BIOPSY: SHX622

## 2019-06-21 LAB — POCT URINE PREGNANCY: Preg Test, Ur: NEGATIVE

## 2019-06-21 NOTE — Progress Notes (Signed)
Pt c/o bleeding from 8/26-8/30

## 2019-06-21 NOTE — Progress Notes (Signed)
   Subjective:    Patient ID: Shannon Santiago, female    DOB: 1966-01-29, 53 y.o.   MRN: [id number]  HPI  Menarche at 89; irregular menses most of life.  Was on OCPs in teens which caused migraines.  PCOS diagnosed in 2007 but had regular menses.  Prediabetic around that time.  Pt had US findings of PCO with small ovarian cysts (assuming "ring of pearls" sign).  Pt's LMP in June 2018 with no bleeding or spotting.  Pt had sudden onset of heavy bleeding in late August.    Review of Systems  Constitutional: Negative.   Respiratory: Negative.   Cardiovascular: Negative.   Gastrointestinal: Negative.   Genitourinary: Positive for pelvic pain and vaginal bleeding.       Objective:   Physical Exam Vitals signs reviewed.  Constitutional:      General: She is not in acute distress.    Appearance: She is well-developed.  HENT:     Head: Normocephalic and atraumatic.  Eyes:     Conjunctiva/sclera: Conjunctivae normal.  Cardiovascular:     Rate and Rhythm: Normal rate.  Pulmonary:     Effort: Pulmonary effort is normal.  Abdominal:     General: Bowel sounds are normal. There is no distension.     Palpations: Abdomen is soft. There is no mass.     Tenderness: There is no abdominal tenderness. There is no guarding or rebound.  Skin:    General: Skin is warm and dry.  Neurological:     Mental Status: She is alert and oriented to person, place, and time.   TVUS FINDINGS: Uterus  Measurements: 8.2 x 5.3 x 4.6 cm = volume: 104 mL. Anteverted, slightly deviated to RIGHT period mildly heterogeneous myometrium. Subserosal leiomyoma at upper uterine segment 3.2 x 2.1 x 3.3 cm. No additional masses.  Endometrium  Thickness: 14 mm.  Small amount of endometrial fluid.  Right ovary  Not visualized on either transabdominal or endovaginal imaging, likely obscured by bowel  Left ovary  Not visualized on either transabdominal or endovaginal imaging, likely obscured by bowel   Other findings  No free pelvic fluid or adnexal masses.  IMPRESSION: Nonvisualization of ovaries.  Subserosal leiomyoma at upper uterus 3.3 cm diameter.  Abnormal thickening of endometrial complex 14 mm thick with small amount of endometrial fluid.    Assessment & Plan:  53 yo female with PMB and thickened endometrium on [place] Will proceed with endometrial biopsy and treat based on results.    ENDOMETRIAL BIOPSY     The indications for endometrial biopsy were reviewed.   Risks of the biopsy including cramping, bleeding, infection, uterine perforation, inadequate specimen and need for additional procedures  were discussed. The patient states she understands and agrees to undergo procedure today. Consent was signed. Time out was performed. Urine HCG was negative. A sterile speculum was placed in the patient's vagina and the cervix was prepped with Betadine. A single-toothed tenaculum was placed on the anterior lip of the cervix to stabilize it. The 3 mm pipelle was introduced into the endometrial cavity without difficulty to a depth of 8 cm, and a moderate amount of tissue was obtained and sent to pathology. The instruments were removed from the patient's vagina. Minimal bleeding from the cervix was noted. The patient tolerated the procedure well. Routine post-procedure instructions were given to the patient. The patient will follow up to review the results and for further management.

## 2019-06-22 LAB — CYTOLOGY - PAP
Chlamydia: NEGATIVE
Diagnosis: NEGATIVE
Neisseria Gonorrhea: NEGATIVE

## 2019-06-22 MED FILL — AMLODIPINE BESYLATE 5 MG TA: 5 | 90 days supply | Qty: 90 | Fill #0

## 2019-06-22 MED FILL — LEVOTHYROXINE 50 MCG TABLET: 50 | 90 days supply | Qty: 90 | Fill #0

## 2019-06-22 MED FILL — LISINOPRIL 20 MG TABLET: 20 | 90 days supply | Qty: 90 | Fill #0

## 2019-06-23 ENCOUNTER — Encounter: Payer: Self-pay | Admitting: Physician Assistant

## 2019-06-23 DIAGNOSIS — R791 Abnormal coagulation profile: Secondary | ICD-10-CM

## 2019-06-23 HISTORY — DX: Abnormal coagulation profile: R79.1

## 2019-06-29 ENCOUNTER — Encounter: Payer: Self-pay | Admitting: Physician Assistant

## 2019-06-29 DIAGNOSIS — R9431 Abnormal electrocardiogram [ECG] [EKG]: Secondary | ICD-10-CM

## 2019-06-29 DIAGNOSIS — R768 Other specified abnormal immunological findings in serum: Secondary | ICD-10-CM

## 2019-06-30 NOTE — Progress Notes (Signed)
Office Visit Note  Patient: Shannon Santiago             Date of Birth: 26-Jun-1966           MRN: [id number]             PCP: Trixie Dredge, PA-C Referring: Ottis Stain* Visit Date: 07/14/2019 Occupation: @GUAROCC @  Subjective:  Joint pain and stiffness.   History of Present Illness: Shannon Santiago is a 53 y.o. female returns for follow-up visit today.  She states she started Cymbalta after the last visit.  She has noticed remarkable improvement in her joint pain and stiffness.  Her muscle pain is improved as well.  She continues to have some stiffness in her hands and feet.  She denies any joint swelling.  She has had no recurrence of oral ulcers or rash.  Activities of Daily Living:  Patient reports morning stiffness for 3 hours.   Patient Reports nocturnal pain.  Difficulty dressing/grooming: Denies Difficulty climbing stairs: Denies Difficulty getting out of chair: Denies Difficulty using hands for taps, buttons, cutlery, and/or writing: Reports  Review of Systems  Constitutional: Positive for fatigue.  HENT: Positive for mouth dryness and nose dryness. Negative for mouth sores.   Eyes: Positive for pain, itching and dryness.  Respiratory: Negative for shortness of breath and difficulty breathing.   Cardiovascular: Negative for chest pain and palpitations.  Gastrointestinal: Positive for constipation. Negative for abdominal pain, blood in stool and diarrhea.  Endocrine: Negative for increased urination.  Genitourinary: Negative for difficulty urinating and painful urination.  Musculoskeletal: Positive for arthralgias, joint pain and morning stiffness. Negative for joint swelling.  Skin: Positive for rash and hair loss.  Allergic/Immunologic: Negative for susceptible to infections.  Neurological: Positive for headaches, memory loss and weakness. Negative for dizziness and numbness.  Hematological: Negative for bruising/bleeding tendency.   Psychiatric/Behavioral: Positive for confusion and sleep disturbance.    PMFS History:  Patient Active Problem List   Diagnosis Date Noted  . Prolonged PTT (partial thromboplastin time) 06/23/2019  . Plantar fasciitis, right 06/03/2019  . Fasting hyperglycemia 05/03/2019  . Hashimoto's thyroiditis 11/30/2018  . Postmenopausal 11/30/2018  . Moderate episode of recurrent major depressive disorder ([place]) 11/30/2018  . Statin declined 11/30/2018  . Past heart attack 06/28/2015  . Mixed hyperlipidemia 05/04/2015  . Moderate persistent asthma without complication [id number]  . Major depressive disorder, recurrent, severe without psychotic features ([place]) 06/10/2014  . DDD (degenerative disc disease), lumbosacral 12/10/2013  . Chronic headaches 11/24/2013  . Hypertension goal BP (blood pressure) < 130/80 11/24/2013    Past Medical History:  Diagnosis Date  . Allergy   . Anxiety   . Depression   . Hyperlipidemia   . Hypertension   . Past heart attack 06/28/2015  . Prolonged PTT (partial thromboplastin time) 06/23/2019   Normal PT  . Thyroid disease     Family History  Problem Relation Age of Onset  . Hypertension Mother   . Stroke Mother   . Lung cancer Mother   . Anxiety disorder Mother   . Depression Mother   . Alcohol abuse Mother   . Skin cancer Father   . Prostate cancer Father   . Hypertension Father   . Diabetes Father   . Thyroid disease Maternal Aunt   . Thyroid disease Maternal Uncle   . Diabetes Brother   . Hypertension Brother   . Colon cancer Maternal Grandmother    Past Surgical History:  Procedure Laterality Date  . ENDOMETRIAL  BIOPSY  06/21/2019  . EYE SURGERY     x 5   Social History   Social History Narrative  . Not on file   Immunization History  Administered Date(s) Administered  . Pneumococcal Polysaccharide-23 12/10/2013  . Tdap 05/05/2015  . Zoster Recombinat (Shingrix) 05/06/2019     Objective: Vital Signs: BP 138/89 (BP Location: Left  Arm, Patient Position: Sitting, Cuff Size: Normal)   Pulse 73   Resp 13   Ht 5\' 1"  (1.549 m)   Wt 159 lb (72.1 kg)   LMP 06/18/2017   BMI 30.04 kg/m    Physical Exam Vitals signs and nursing note reviewed.  Constitutional:      Appearance: She is well-developed.  HENT:     Head: Normocephalic and atraumatic.  Eyes:     Conjunctiva/sclera: Conjunctivae normal.  Neck:     Musculoskeletal: Normal range of motion.  Cardiovascular:     Rate and Rhythm: Normal rate and regular rhythm.     Heart sounds: Normal heart sounds.  Pulmonary:     Effort: Pulmonary effort is normal.     Breath sounds: Normal breath sounds.  Abdominal:     General: Bowel sounds are normal.     Palpations: Abdomen is soft.  Lymphadenopathy:     Cervical: No cervical adenopathy.  Skin:    General: Skin is warm and dry.     Capillary Refill: Capillary refill takes less than 2 seconds.  Neurological:     Mental Status: She is alert and oriented to person, place, and time.  Psychiatric:        Behavior: Behavior normal.      Musculoskeletal Exam: She has a stiffness with range of motion of her cervical spine.  She had discomfort range of motion of her lumbar spine.  Shoulder joints, elbow joints, wrist joints, MCPs PIPs DIPs with good range of motion with no synovitis.  She had good range of motion of bilateral hip joints knee joints ankles MTPs and PIPs.  She had positive tender points and some hyperalgesia.  CDAI Exam: CDAI Score: - Patient Global: -; Provider Global: - Swollen: -; Tender: - Joint Exam   No joint exam has been documented for this visit   There is currently no information documented on the homunculus. Go to the Rheumatology activity and complete the homunculus joint exam.  Investigation: No additional findings.  Imaging: US Pelvic Complete With Transvaginal  Result Date: 06/18/2019 CLINICAL DATA:  Postmenopausal bleeding EXAM: TRANSABDOMINAL AND TRANSVAGINAL ULTRASOUND OF PELVIS  TECHNIQUE: Both transabdominal and transvaginal ultrasound examinations of the pelvis were performed. Transabdominal technique was performed for global imaging of the pelvis including uterus, ovaries, adnexal regions, and pelvic cul-de-sac. It was necessary to proceed with endovaginal exam following the transabdominal exam to visualize the ovaries. COMPARISON:  None FINDINGS: Uterus Measurements: 8.2 x 5.3 x 4.6 cm = volume: 104 mL. Anteverted, slightly deviated to RIGHT period mildly heterogeneous myometrium. Subserosal leiomyoma at upper uterine segment 3.2 x 2.1 x 3.3 cm. No additional masses. Endometrium Thickness: 14 mm.  Small amount of endometrial fluid. Right ovary Not visualized on either transabdominal or endovaginal imaging, likely obscured by bowel Left ovary Not visualized on either transabdominal or endovaginal imaging, likely obscured by bowel Other findings No free pelvic fluid or adnexal masses. IMPRESSION: Nonvisualization of ovaries. Subserosal leiomyoma at upper uterus 3.3 cm diameter. Abnormal thickening of endometrial complex 14 mm thick with small amount of endometrial fluid. In the setting of post-menopausal bleeding, endometrial sampling  is indicated to exclude carcinoma. If results are benign, sonohysterogram should be considered for focal lesion work-up. (Ref: Radiological Reasoning: Algorithmic Workup of Abnormal Vaginal Bleeding with Endovaginal Sonography and Sonohysterography. AJR [id number]) These results will be called to the ordering clinician or representative by the Radiologist Assistant, and communication documented in the PACS or zVision Dashboard. At Electronically Signed   By: Lavonia Dana M.D.   On: 06/18/2019 17:04   Xr Foot 2 Views Left  Result Date: 06/16/2019 First MTP, PIP and DIP joint space narrowing was noted.  No other MTPs were narrow.  No erosive changes were noted.  No intertarsal, tibiotalar or subtalar joint space narrowing was noted.  Posterior  inferior calcaneal spurs were noted. Impression: These findings are consistent with osteoarthritis of the foot.  Xr Foot 2 Views Right  Result Date: 06/16/2019 First MTP, PIP and DIP joint space narrowing was noted.  No erosive changes were noted.  No tibiotalar, subtalar or intertarsal joint space narrowing was noted.  Inferior p calcaneal spur was noted. Impression: These findings are consistent with osteoarthritis of the foot.  Xr Hand 2 View Left  Result Date: 06/16/2019 CMC, PIP and DIP joint space narrowing was noted.  No MCP, intercarpal radiocarpal joint space narrowing was noted.  No erosive changes were noted. Impression: These findings are consistent with osteoarthritis of the hand.  Xr Hand 2 View Right  Result Date: 06/16/2019 CMC, PIP and DIP joint space narrowing was noted.  No MCP, intercarpal radiocarpal joint space narrowing was noted.  No erosive changes were noted. Impression: These findings are consistent with osteoarthritis of the hand.   Recent Labs: Lab Results  Component Value Date   WBC 9.5 06/18/2019   HGB 14.2 06/18/2019   PLT 415 (H) 06/18/2019   NA 137 04/28/2019   K 4.3 04/28/2019   CL 101 04/28/2019   CO2 27 04/28/2019   GLUCOSE 104 (H) 04/28/2019   BUN 15 04/28/2019   CREATININE 0.80 04/28/2019   BILITOT 0.5 04/28/2019   AST 13 04/28/2019   ALT 20 04/28/2019   PROT 7.1 04/28/2019   CALCIUM 9.8 04/28/2019   GFRAA 98 04/28/2019  June 16, 2019 UA showed 3+ hemoglobin, ANA negative, G6PD normal, C3-C4 normal, RF negative, anti-CCP negative  05/07/19: ANA 1:80 NH, CRP 13.8, sed rate 29, CK 40, Vitamin D 52, Vitamin B12 1,965, folate 18, B burgdorferi Ab-, HIV-, RPR-  Speciality Comments: No specialty comments available.  Procedures:  No procedures performed Allergies: Morphine and related, Peanut oil, Penicillins, Polymyxin b-trimethoprim, Citalopram, Food, and Singulair [montelukast sodium]   Assessment / Plan:     Visit Diagnoses: Positive  ANA (antinuclear antibody) - ANA 1: 80NH initially but repeat ANA was negative., all other autoimmune work-up negative.  She initially give history of intermittent joint swelling.  She has had no joint swelling since last visit.  She also gives history of intermittent oral ulcers and rash but no recurrence since then.  Have advised her to contact me in case she develops new symptoms.  Primary osteoarthritis of both hands - Mild osteoarthritic changes on the x-rays.  I do not see any synovitis on examination.  Joint protection was discussed.  Primary osteoarthritis of both feet-she complains of some feet discomfort.  Proper fitting shoes were discussed.  Neck pain-she has been experiencing some neck discomfort.  Patient with good range of motion with no radiculopathy.  She has some stiffness with range of motion of her cervical spine.  Have given  her a handout on neck exercises.  I offered physical therapy but she declined.  DDD (degenerative disc disease), lumbosacral-she continues to have lower back pain.  She declined physical therapy.  I have given her a handout on back exercises.  Some of the exercises were demonstrated in the office.  Myofascial pain - Her symptoms improved on Cymbalta.  Need for regular exercise and good sleep hygiene was emphasized. Other fatigue-related to insomnia.  Primary insomnia-good sleep hygiene was discussed.  Other medical problems are listed as follows:  Essential hypertension  Mixed hyperlipidemia  Moderate persistent asthma without complication  Hashimoto's thyroiditis  Moderate episode of recurrent major depressive disorder (HCC)  Postmenopausal - Discuss scheduling DEXA  Orders: No orders of the defined types were placed in this encounter.  No orders of the defined types were placed in this encounter.    Follow-Up Instructions: Return if symptoms worsen or fail to improve, for Osteoarthritis, MFPS.   Bo Merino, MD  Note - This  record has been created using Editor, commissioning.  Chart creation errors have been sought, but may not always  have been located. Such creation errors do not reflect on  the standard of medical care.

## 2019-07-01 ENCOUNTER — Ambulatory Visit (INDEPENDENT_AMBULATORY_CARE_PROVIDER_SITE_OTHER): Payer: 59 | Admitting: Physician Assistant

## 2019-07-01 ENCOUNTER — Encounter: Payer: Self-pay | Admitting: Cardiology

## 2019-07-01 ENCOUNTER — Encounter: Payer: Self-pay | Admitting: Physician Assistant

## 2019-07-01 ENCOUNTER — Telehealth: Payer: Self-pay

## 2019-07-01 ENCOUNTER — Other Ambulatory Visit: Payer: Self-pay

## 2019-07-01 ENCOUNTER — Ambulatory Visit (INDEPENDENT_AMBULATORY_CARE_PROVIDER_SITE_OTHER): Payer: 59 | Admitting: Sports Medicine

## 2019-07-01 ENCOUNTER — Ambulatory Visit (INDEPENDENT_AMBULATORY_CARE_PROVIDER_SITE_OTHER): Payer: 59 | Admitting: Cardiology

## 2019-07-01 VITALS — BP 126/86 | HR 81 | Temp 98.7°F | Wt 159.0 lb

## 2019-07-01 VITALS — BP 110/80 | HR 91 | Temp 98.1°F | Ht 61.0 in | Wt 158.0 lb

## 2019-07-01 DIAGNOSIS — E782 Mixed hyperlipidemia: Secondary | ICD-10-CM

## 2019-07-01 DIAGNOSIS — M722 Plantar fascial fibromatosis: Secondary | ICD-10-CM

## 2019-07-01 DIAGNOSIS — M797 Fibromyalgia: Secondary | ICD-10-CM

## 2019-07-01 DIAGNOSIS — F331 Major depressive disorder, recurrent, moderate: Secondary | ICD-10-CM | POA: Diagnosis not present

## 2019-07-01 DIAGNOSIS — I491 Atrial premature depolarization: Secondary | ICD-10-CM

## 2019-07-01 DIAGNOSIS — I10 Essential (primary) hypertension: Secondary | ICD-10-CM

## 2019-07-01 DIAGNOSIS — R079 Chest pain, unspecified: Secondary | ICD-10-CM

## 2019-07-01 DIAGNOSIS — Z809 Family history of malignant neoplasm, unspecified: Secondary | ICD-10-CM

## 2019-07-01 DIAGNOSIS — R0789 Other chest pain: Secondary | ICD-10-CM | POA: Diagnosis not present

## 2019-07-01 MED ORDER — DULOXETINE HCL 40 MG PO CPEP: 40.0000 mg | ORAL_CAPSULE | Freq: Every day | ORAL | 1 refills | Status: DC

## 2019-07-01 NOTE — Patient Instructions (Signed)
Call [phone] to schedule mammogram

## 2019-07-01 NOTE — Telephone Encounter (Signed)
Instruct her to use Goodrx instead of insurance

## 2019-07-01 NOTE — Telephone Encounter (Signed)
Pharmacy left vm stating that the higher dose of Cymbalta is not covered by insurance. Please advise. -EH/RMA

## 2019-07-01 NOTE — Patient Instructions (Signed)
Medication Instructions:  Your physician recommends that you continue on your current medications as directed. Please refer to the Current Medication list given to you today.  If you need a refill on your cardiac medications before your next appointment, please call your pharmacy.   Lab work: Your physician recommends that you return for lab work in: 8 WEEKS Fasting Lipid  If you have labs (blood work) drawn today and your tests are completely normal, you will receive your results only by: Marland Kitchen MyChart Message (if you have MyChart) OR . A paper copy in the mail If you have any lab test that is abnormal or we need to change your treatment, we will call you to review the results.  Testing/Procedures: Your physician has requested that you have an echocardiogram. Echocardiography is a painless test that uses sound waves to create images of your heart. It provides your doctor with information about the size and shape of your heart and how well your heart's chambers and valves are working. This procedure takes approximately one hour. There are no restrictions for this procedure.    Follow-Up: At [facility], you and your health needs are our priority.  As part of our continuing mission to provide you with exceptional heart care, we have created designated Provider Care Teams.  These Care Teams include your primary Cardiologist (physician) and Advanced Practice Providers (APPs -  Physician Assistants and Nurse Practitioners) who all work together to provide you with the care you need, when you need it. You will need a follow up appointment in 6 months with Dr Harriet Masson Any Other Special Instructions Will Be Listed Below (If Applicable).

## 2019-07-01 NOTE — Progress Notes (Signed)
Subjective:    CC: Follow-up  HPI: Plantar fasciitis: Resolved  I reviewed the past medical history, family history, social history, surgical history, and allergies today and no changes were needed.  Please see the problem list section below in epic for further details.  Past Medical History: Past Medical History:  Diagnosis Date  . Allergy   . Anxiety   . Depression   . Hyperlipidemia   . Hypertension   . Prolonged PTT (partial thromboplastin time) 06/23/2019   Normal PT  . Thyroid disease    Past Surgical History: Past Surgical History:  Procedure Laterality Date  . EYE SURGERY     x 5   Social History: Social History   Socioeconomic History  . Marital status: Single    Spouse name: Not on file  . Number of children: Not on file  . Years of education: Not on file  . Highest education level: Not on file  Occupational History  . Not on file  Social Needs  . Financial resource strain: Not on file  . Food insecurity    Worry: Not on file    Inability: Not on file  . Transportation needs    Medical: Not on file    Non-medical: Not on file  Tobacco Use  . Smoking status: Never Smoker  . Smokeless tobacco: Never Used  Substance and Sexual Activity  . Alcohol use: Yes    Comment: occ  . Drug use: Never  . Sexual activity: Not Currently    Birth control/protection: Abstinence  Lifestyle  . Physical activity    Days per week: Not on file    Minutes per session: Not on file  . Stress: Not on file  Relationships  . Social Herbalist on phone: Not on file    Gets together: Not on file    Attends religious service: Not on file    Active member of club or organization: Not on file    Attends meetings of clubs or organizations: Not on file    Relationship status: Not on file  Other Topics Concern  . Not on file  Social History Narrative  . Not on file   Family History: Family History  Problem Relation Age of Onset  . Hypertension Mother   .  Stroke Mother   . Lung cancer Mother   . Anxiety disorder Mother   . Depression Mother   . Alcohol abuse Mother   . Skin cancer Father   . Prostate cancer Father   . Hypertension Father   . Diabetes Father   . Thyroid disease Maternal Aunt   . Thyroid disease Maternal Uncle   . Diabetes Brother   . Hypertension Brother   . Colon cancer Maternal Grandmother    Allergies: Allergies  Allergen Reactions  . Morphine And Related Other (See Comments)    Numb hands and feet  . Peanut Oil Other (See Comments)    Stomach cramping, hand/foot swelling  . Penicillins Hives  . Polymyxin B-Trimethoprim Other (See Comments), Photosensitivity and Swelling  . Citalopram Other (See Comments)    SSRI's ineffective - Citalopram, Fluoxetine, Sertraline, Excitalopram   . Food Nausea Only, Swelling, Palpitations, Other (See Comments) and Cough    WHEAT  . Singulair [Montelukast Sodium] Other (See Comments)    Mood changes, worsening depression   Medications: See med rec.  Review of Systems: No fevers, chills, night sweats, weight loss, chest pain, or shortness of breath.   Objective:  General: Well Developed, well nourished, and in no acute distress.  Neuro: Alert and oriented x3, extra-ocular muscles intact, sensation grossly intact.  HEENT: Normocephalic, atraumatic, pupils equal round reactive to light, neck supple, no masses, no lymphadenopathy, thyroid nonpalpable.  Skin: Warm and dry, no rashes. Cardiac: Regular rate and rhythm, no murmurs rubs or gallops, no lower extremity edema.  Respiratory: Clear to auscultation bilaterally. Not using accessory muscles, speaking in full sentences.  Impression and Recommendations:    Plantar fasciitis, right Overall doing well, we injected her plantar fascia the last visit, she does have custom orthotics from Dr. Raeford Razor. Continue the rehab exercises, avoid barefoot walking and return to see me as needed. She has already tried a night splint and  we could certainly add a air heel brace should she have recurrence of symptoms.   ___________________________________________ Gwen Her. Dianah Field, M.D., ABFM., CAQSM. Primary Care and Sports Medicine [facility]  Adjunct Professor of [place] [employer] [facility] of Medicine

## 2019-07-01 NOTE — Progress Notes (Signed)
Cardiology Office Note:    Date:  07/02/2019   ID:  Shannon Santiago, DOB Apr 26, 1966, MRN [id number]  PCP:  Trixie Dredge, PA-C  Cardiologist:  No primary care provider on file.  Electrophysiologist:  None   Referring MD: Ottis Stain*   Chief Complaint  Patient presents with  . Abnormal ECG    History of Present Illness:    Shannon Santiago is a 53 y.o. female with a hx of hypertension, hyperlipidemia presents today for an initial consultation due reported abnormal ekg. She reports that she has had intermittent evidence of substernal and sometime epigastic pain. She denies any associated shortness of breath or palpitations. She notes that the pain feels more like a tightness and last for several minutes with full resolution. She denies any aggravating and relieving factors. Of notes she states that she does have hiatal hernia and she believes at time it contributes to her pain.  No other complaints today.  Past Medical History:  Diagnosis Date  . Allergy   . Anxiety   . Depression   . Hyperlipidemia   . Hypertension   . Past heart attack 06/28/2015  . Prolonged PTT (partial thromboplastin time) 06/23/2019   Normal PT  . Thyroid disease     Past Surgical History:  Procedure Laterality Date  . EYE SURGERY     x 5    Current Medications: Current Meds  Medication Sig  . Albuterol Sulfate (PROAIR RESPICLICK) 123XX123 (90 Base) MCG/ACT AEPB Inhale 1-2 puffs into the lungs every 4 (four) hours as needed.  Marland Kitchen amLODipine (NORVASC) 5 MG tablet Take 1 tablet (5 mg total) by mouth daily.  . Ascorbic Acid (VITAMIN C) 1000 MG tablet Take 1,000 mg by mouth daily.  Marland Kitchen b complex vitamins tablet Take 1 tablet by mouth daily.  . baclofen (LIORESAL) 10 MG tablet Take 1 tablet (10 mg total) by mouth 3 (three) times daily as needed for muscle spasms (headache).  Marland Kitchen buPROPion (WELLBUTRIN) 75 MG tablet Take 1 tablet (75 mg total) by mouth 2 (two) times daily.  Marland Kitchen CALCIUM PO Take by  mouth.  . Cholecalciferol (VITAMIN D3) 10 MCG (400 UNIT) tablet Take 400 Units by mouth daily.  Marland Kitchen EPINEPHrine 0.3 mg/0.3 mL IJ SOAJ injection Inject 0.3 mLs (0.3 mg total) into the muscle once as needed (anaphylaxis/allergic reaction).  Marland Kitchen EVENING PRIMROSE OIL PO Take by mouth.  . Ferrous Sulfate (IRON PO) Take by mouth.  . fexofenadine (ALLEGRA) 180 MG tablet Take by mouth.  . Ginger, Zingiber officinalis, (GINGER ROOT PO) Take by mouth.  . levothyroxine (SYNTHROID) 50 MCG tablet Take 1 tablet (50 mcg total) by mouth daily before breakfast.  . lisinopril (ZESTRIL) 20 MG tablet Take 1 tablet (20 mg total) by mouth daily.  . Magnesium 500 MG CAPS Take by mouth.  . Multiple Vitamin (MULTIVITAMIN) capsule Take 1 capsule by mouth daily.  . naproxen (NAPROSYN) 375 MG tablet Take 1 tablet (375 mg total) by mouth 2 (two) times daily with a meal.  . Omega-3 Fatty Acids (FISH OIL) 1000 MG CAPS Take by mouth.  . Probiotic Product (PROBIOTIC ADVANCED PO) Take by mouth.  . Red Yeast Rice Extract (RED YEAST RICE PO) Take by mouth.  . [DISCONTINUED] DULoxetine HCl 40 MG CPEP Take 40 mg by mouth daily.     Allergies:   Morphine and related, Peanut oil, Penicillins, Polymyxin b-trimethoprim, Citalopram, Food, and Singulair [montelukast sodium]   Social History   Socioeconomic History  . Marital status:  Single    Spouse name: Not on file  . Number of children: Not on file  . Years of education: Not on file  . Highest education level: Not on file  Occupational History  . Not on file  Social Needs  . Financial resource strain: Not on file  . Food insecurity    Worry: Not on file    Inability: Not on file  . Transportation needs    Medical: Not on file    Non-medical: Not on file  Tobacco Use  . Smoking status: Never Smoker  . Smokeless tobacco: Never Used  Substance and Sexual Activity  . Alcohol use: Yes    Comment: occ  . Drug use: Never  . Sexual activity: Not Currently    Birth  control/protection: Abstinence  Lifestyle  . Physical activity    Days per week: Not on file    Minutes per session: Not on file  . Stress: Not on file  Relationships  . Social Herbalist on phone: Not on file    Gets together: Not on file    Attends religious service: Not on file    Active member of club or organization: Not on file    Attends meetings of clubs or organizations: Not on file    Relationship status: Not on file  Other Topics Concern  . Not on file  Social History Narrative  . Not on file     Family History: The patient's *family history includes Alcohol abuse in her mother; Anxiety disorder in her mother; Colon cancer in her maternal grandmother; Depression in her mother; Diabetes in her brother and father; Hypertension in her brother, father, and mother; Lung cancer in her mother; Prostate cancer in her father; Skin cancer in her father; Stroke in her mother; Thyroid disease in her maternal aunt and maternal uncle.  ROS:     Review of Systems  Constitution: Negative for chills, decreased appetite and malaise/fatigue.  HENT: Negative for congestion, hearing loss and sore throat.   Eyes: Negative for blurred vision, photophobia and redness.  Cardiovascular: Negative for chest pain, dyspnea on exertion, palpitations and syncope.  Respiratory: Negative for cough, shortness of breath and sputum production.   Endocrine: Negative for cold intolerance, polydipsia and polyuria.  Hematologic/Lymphatic: Does not bruise/bleed easily.  Skin: Negative for itching, nail changes and suspicious lesions.  Musculoskeletal: Negative for back pain, joint pain and joint swelling.  Gastrointestinal: Negative for abdominal pain, constipation, excessive appetite and nausea.  Genitourinary: Negative for bladder incontinence, flank pain and hematuria.  Neurological: Negative for aphonia, seizures and weakness.  Psychiatric/Behavioral: Positive for depression. Negative for memory  loss and substance abuse. The patient is not nervous/anxious.   Allergic/Immunologic: Negative for HIV exposure.    EKGs/Labs/Other Studies Reviewed:    The following studies were reviewed today:  EKG:  The ekg ordered today demonstrates sinus rhythm, heart rate 93 bpm rare PACs with left atrial enlargement.  Recent Labs: 04/28/2019: ALT 20; BUN 15; Creat 0.80; Potassium 4.3; Sodium 137 06/18/2019: Hemoglobin 14.2; Platelets 415  Recent Lipid Panel    Component Value Date/Time   CHOL 255 (H) 04/28/2019 1119   TRIG 145 04/28/2019 1119   HDL 46 (L) 04/28/2019 1119   CHOLHDL 5.5 (H) 04/28/2019 1119   LDLCALC 180 (H) 04/28/2019 1119    Physical Exam:    VS:  BP 110/80 (BP Location: Left Arm, Patient Position: Sitting, Cuff Size: Normal)   Pulse 91   Temp 98.1  F (36.7 C)   Ht 5\' 1"  (1.549 m)   Wt 158 lb (71.7 kg)   LMP 06/18/2017   SpO2 98%   BMI 29.85 kg/m     Wt Readings from Last 3 Encounters:  07/01/19 158 lb (71.7 kg)  07/01/19 159 lb (72.1 kg)  07/01/19 159 lb (72.1 kg)     [id number] nourished, well developed in no acute distress HEENT: Normal NECK: No JVD; No carotid bruits LYMPHATICS: No lymphadenopathy CARDIAC: S1S2 noted, RRR, no murmurs, rubs, gallops RESPIRATORY:  Clear to auscultation without rales, wheezing or rhonchi  ABDOMEN: Soft, non-tender, non-distended EXTREMITIES: No cyanosis no edema no clubbing. MUSCULOSKELETAL:  No edema; No deformity  SKIN: Warm and dry NEUROLOGIC:  Alert and oriented x 3, nonfocal PSYCHIATRIC:  Normal affect, good insight  ASSESSMENT:    1. Essential hypertension   2. Mixed hyperlipidemia   3. Premature atrial complex   4. Chest pain of uncertain etiology    PLAN:    In order of problems listed above:  1. Her blood pressure is controlled today, she will therefore remain on her current antihypertensive regimen. I will like to assess her LV function and wall thickness with a transthoracic echo.  2. Her lab in  04/28/2019 reported total chol of 255,HDL 46 and LDL of 180. She recently started taking red yeast. Plan to repeat in 8 weeks.  3. I do believe her chest pain is noncardiac, therefore will not pursue an ischemic evaluation. We will reasses need for further evaluation if this persists. 4. PACs are asymptomatic and rare- will monitor. She will notify me if she starts to experience symptoms. 5. She sis in agreement with the above plan, she will follow up in 2 months.    Medication Adjustments/Labs and Tests Ordered: Current medicines are reviewed at length with the patient today.  Concerns regarding medicines are outlined above.  Orders Placed This Encounter  Procedures  . Lipid Profile  . EKG 12-Lead  . ECHOCARDIOGRAM COMPLETE   No orders of the defined types were placed in this encounter.   Patient Instructions  Medication Instructions:  Your physician recommends that you continue on your current medications as directed. Please refer to the Current Medication list given to you today.  If you need a refill on your cardiac medications before your next appointment, please call your pharmacy.   Lab work: Your physician recommends that you return for lab work in: 8 WEEKS Fasting Lipid  If you have labs (blood work) drawn today and your tests are completely normal, you will receive your results only by: Marland Kitchen MyChart Message (if you have MyChart) OR . A paper copy in the mail If you have any lab test that is abnormal or we need to change your treatment, we will call you to review the results.  Testing/Procedures: Your physician has requested that you have an echocardiogram. Echocardiography is a painless test that uses sound waves to create images of your heart. It provides your doctor with information about the size and shape of your heart and how well your heart's chambers and valves are working. This procedure takes approximately one hour. There are no restrictions for this procedure.     Follow-Up: At [facility], you and your health needs are our priority.  As part of our continuing mission to provide you with exceptional heart care, we have created designated Provider Care Teams.  These Care Teams include your primary Cardiologist (physician) and Advanced Practice Providers (APPs -  Physician  Assistants and Nurse Practitioners) who all work together to provide you with the care you need, when you need it. You will need a follow up appointment in 6 months with Dr Harriet Masson Any Other Special Instructions Will Be Listed Below (If Applicable).       Rolly Pancake, DO  07/02/2019 10:47 PM    [facility]

## 2019-07-01 NOTE — Progress Notes (Signed)
HPI:                                                                Shannon Santiago is a 53 y.o. female who presents to [facility] [place] today for follow-up  She was re-started on Duloxetine 30 mg approx 1 month ago She states medication has helped her mood and her widespread muscle pain She states anxiety is still affecting her. 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 She reports sleep is still terrible. Multiple nighttime awakenings every 3.5 hours. She states she has always had anxiety problems at night since childhood. She has tried listening to [employer] and self-hypnosis, which has been helpful.  She was evaluated by Rheumatology who did not feel there was any evidence of lupus or autoimmune disease.   She has a strong family history of cancer Mother lung cancer, age at diagnosis 53 Father prostate cancer (non-metastatic), age at diagnosis mid-70's; skin cancer (unknown)  Maternal first cousin unknown metastatic cancer, age at diagnosis late 20's Maternal grandmother, colon cancer (unkown age)  Depression screen [facility] 2/9 07/01/2019 06/03/2019 05/06/2019 11/30/2018  Decreased Interest 1 3 2 2   Down, Depressed, Hopeless 1 3 2 3   PHQ - 2 Score 2 6 4 5   Altered sleeping 3 3 3 3   Tired, decreased energy 3 3 3 3   Change in appetite 0 1 2 2   Feeling bad or failure about yourself  1 2 2 3   Trouble concentrating 2 3 2 2   Moving slowly or fidgety/restless 0 1 0 0  Suicidal thoughts 0 0 0 0  PHQ-9 Score 11 19 16 18   Difficult doing work/chores - Very difficult - -    GAD 7 : Generalized Anxiety Score 07/01/2019 06/03/2019 05/06/2019 11/30/2018  Nervous, Anxious, on Edge 2 2 3 3   Control/stop worrying 1 2 2 3   Worry too much - different things 1 2 1 3   Trouble relaxing 3 3 2 3   Restless 3 2 1 2   Easily annoyed or irritable 1 2 3 2   Afraid - awful might happen 1 2 3 2   Total GAD 7 Score 12 15 15 18   Anxiety Difficulty - Somewhat  difficult - -      Past Medical History:  Diagnosis Date  . Allergy   . Anxiety   . Depression   . Hyperlipidemia   . Hypertension   . Prolonged PTT (partial thromboplastin time) 06/23/2019   Normal PT  . Thyroid disease    Past Surgical History:  Procedure Laterality Date  . EYE SURGERY     x 5   Social History   Tobacco Use  . Smoking status: Never Smoker  . Smokeless tobacco: Never Used  Substance Use Topics  . Alcohol use: Yes    Comment: occ   family history includes Anxiety disorder in her mother; Depression in her mother; Diabetes in her brother and father; Hypertension in her brother, father, and mother; Lung cancer in her mother; Prostate cancer in her father; Skin cancer in her father; Stroke in her mother; Thyroid disease in her maternal aunt and maternal uncle.    ROS: negative except as noted in the HPI  Medications: Current Outpatient Medications  Medication Sig Dispense Refill  .  Albuterol Sulfate (PROAIR RESPICLICK) 123XX123 (90 Base) MCG/ACT AEPB Inhale 1-2 puffs into the lungs every 4 (four) hours as needed. 1 each 4  . amLODipine (NORVASC) 5 MG tablet Take 1 tablet (5 mg total) by mouth daily. 90 tablet 1  . Ascorbic Acid (VITAMIN C) 1000 MG tablet Take 1,000 mg by mouth daily.    Marland Kitchen b complex vitamins tablet Take 1 tablet by mouth daily.    . baclofen (LIORESAL) 10 MG tablet Take 1 tablet (10 mg total) by mouth 3 (three) times daily as needed for muscle spasms (headache). 60 each 2  . buPROPion (WELLBUTRIN) 75 MG tablet Take 1 tablet (75 mg total) by mouth 2 (two) times daily. 180 tablet 1  . CALCIUM PO Take by mouth.    . Cholecalciferol (VITAMIN D3) 10 MCG (400 UNIT) tablet Take 400 Units by mouth daily.    . DULoxetine (CYMBALTA) 30 MG capsule Take 1 capsule (30 mg total) by mouth daily. 30 capsule 2  . EPINEPHrine 0.3 mg/0.3 mL IJ SOAJ injection Inject 0.3 mLs (0.3 mg total) into the muscle once as needed (anaphylaxis/allergic reaction). 1 each 3  .  EVENING PRIMROSE OIL PO Take by mouth.    . Ferrous Sulfate (IRON PO) Take by mouth.    . fexofenadine (ALLEGRA) 180 MG tablet Take by mouth.    . Ginger, Zingiber officinalis, (GINGER ROOT PO) Take by mouth.    . levothyroxine (SYNTHROID) 50 MCG tablet Take 1 tablet (50 mcg total) by mouth daily before breakfast. 90 tablet 3  . lisinopril (ZESTRIL) 20 MG tablet Take 1 tablet (20 mg total) by mouth daily. 90 tablet 1  . Magnesium 500 MG CAPS Take by mouth.    . Multiple Minerals (MULTI-MINERALS PO) Take by mouth.    . Multiple Vitamin (MULTIVITAMIN) capsule Take 1 capsule by mouth daily.    . naproxen (NAPROSYN) 375 MG tablet Take 1 tablet (375 mg total) by mouth 2 (two) times daily with a meal. 30 tablet 0  . Omega-3 Fatty Acids (FISH OIL) 1000 MG CAPS Take by mouth.    . Probiotic Product (PROBIOTIC ADVANCED PO) Take by mouth.    . Red Yeast Rice Extract (RED YEAST RICE PO) Take by mouth.     No current facility-administered medications for this visit.    Allergies  Allergen Reactions  . Morphine And Related Other (See Comments)    Numb hands and feet  . Peanut Oil Other (See Comments)    Stomach cramping, hand/foot swelling  . Penicillins Hives  . Polymyxin B-Trimethoprim Other (See Comments), Photosensitivity and Swelling  . Citalopram Other (See Comments)    SSRI's ineffective - Citalopram, Fluoxetine, Sertraline, Excitalopram   . Food Nausea Only, Swelling, Palpitations, Other (See Comments) and Cough    WHEAT  . Singulair [Montelukast Sodium] Other (See Comments)    Mood changes, worsening depression       Objective:  BP 126/86   Pulse 81   Temp 98.7 F (37.1 C) (Oral)   Wt 159 lb (72.1 kg)   LMP 06/18/2017   BMI 30.04 kg/m   Wt Readings from Last 3 Encounters:  07/01/19 159 lb (72.1 kg)  06/21/19 160 lb (72.6 kg)  06/18/19 162 lb (73.5 kg)   Temp Readings from Last 3 Encounters:  07/01/19 98.7 F (37.1 C) (Oral)  06/18/19 98.1 F (36.7 C) (Oral)  06/03/19  98.5 F (36.9 C) (Oral)   BP Readings from Last 3 Encounters:  07/01/19 126/86  06/21/19 120/84  06/18/19 (!) 144/96   Pulse Readings from Last 3 Encounters:  07/01/19 81  06/21/19 86  06/18/19 81    Gen:  alert, not ill-appearing, no distress, appropriate for age 89: head normocephalic without obvious abnormality, conjunctiva and cornea clear, trachea midline Pulm: Normal work of breathing, normal phonation, clear to auscultation bilaterally, no wheezes, rales or rhonchi CV: Normal rate, regular rhythm, s1 and s2 distinct, no murmurs, clicks or rubs  Neuro: alert and oriented x 3, no tremor MSK: extremities atraumatic, normal gait and station Skin: intact, no rashes on exposed skin, no jaundice, no cyanosis Psych: well-groomed, cooperative, good eye contact, euthymic mood, affect mood-congruent, speech is articulate, and thought processes clear and goal-directed    No results found for this or any previous visit (from the past 72 hour(s)). No results found.    Assessment and Plan: 53 y.o. female with   .Kristianna was seen today for anxiety and depression.  Diagnoses and all orders for this visit:  Moderate episode of recurrent major depressive disorder (HCC) -     Discontinue: DULoxetine HCl 40 MG CPEP; Take 40 mg by mouth daily.  Fibromyalgia -     Discontinue: DULoxetine HCl 40 MG CPEP; Take 40 mg by mouth daily.  Family history of cancer   She will schedule nurse visit for [facility] hereditary cancer panel Discussed age-recommended cancer screenings. She is overdue for mammogram and was provided with info to schedule Pap UTD Colonoscopy UTD  Partial response to SNRI Increase Duloxetine to 40 mg QD   Patient education and anticipatory guidance given Patient agrees with treatment plan Follow-up in 6 weeks with Dr. Loni Muse for anxiety/mood as needed if symptoms worsen or fail to improve  Darlyne Russian PA-C

## 2019-07-01 NOTE — Assessment & Plan Note (Signed)
Overall doing well, we injected her plantar fascia the last visit, she does have custom orthotics from Dr. Raeford Razor. Continue the rehab exercises, avoid barefoot walking and return to see me as needed. She has already tried a night splint and we could certainly add a air heel brace should she have recurrence of symptoms.

## 2019-07-02 ENCOUNTER — Encounter: Payer: Self-pay | Admitting: Physician Assistant

## 2019-07-02 MED ORDER — DULOXETINE HCL 30 MG PO CPEP: 60.0000 mg | ORAL_CAPSULE | Freq: Every day | ORAL | 2 refills | Status: DC

## 2019-07-02 MED FILL — DULoxetine HCL 40 MG CPEP: 40 | 30 days supply | Qty: 30 | Fill #0

## 2019-07-02 NOTE — Telephone Encounter (Signed)
Recommendations left on vm -EH/RMA  

## 2019-07-05 ENCOUNTER — Ambulatory Visit (HOSPITAL_BASED_OUTPATIENT_CLINIC_OR_DEPARTMENT_OTHER)
Admission: RE | Admit: 2019-07-05 | Discharge: 2019-07-05 | Disposition: A | Discharge status: Home / Self Care | Payer: 59 | Source: Ambulatory Visit | Attending: Cardiology | Admitting: Cardiology

## 2019-07-05 DIAGNOSIS — I10 Essential (primary) hypertension: Secondary | ICD-10-CM | POA: Insufficient documentation

## 2019-07-05 NOTE — Progress Notes (Signed)
  Echocardiogram 2D Echocardiogram has been performed.  Shannon Santiago 07/05/2019, 11:42 AM

## 2019-07-06 ENCOUNTER — Telehealth: Payer: Self-pay | Admitting: *Deleted

## 2019-07-06 NOTE — Telephone Encounter (Signed)
-----   Message from Berniece Salines, DO sent at 07/05/2019 12:45 PM EDT ----- Normal echo please notify patient.

## 2019-07-06 NOTE — Telephone Encounter (Signed)
Telephone call to patient. Left message that echo results were normal and to call with any questions.

## 2019-07-07 ENCOUNTER — Telehealth: Payer: Self-pay | Admitting: *Deleted

## 2019-07-07 ENCOUNTER — Other Ambulatory Visit: Payer: Self-pay

## 2019-07-07 ENCOUNTER — Telehealth (INDEPENDENT_AMBULATORY_CARE_PROVIDER_SITE_OTHER): Payer: 59 | Admitting: Obstetrics & Gynecology

## 2019-07-07 ENCOUNTER — Encounter: Payer: Self-pay | Admitting: Obstetrics & Gynecology

## 2019-07-07 DIAGNOSIS — N950 Postmenopausal bleeding: Secondary | ICD-10-CM | POA: Diagnosis not present

## 2019-07-07 MED ORDER — MEDROXYPROGESTERONE ACETATE 10 MG PO TABS: 10.0000 mg | ORAL_TABLET | Freq: Every day | ORAL | 0 refills | Status: DC

## 2019-07-07 MED FILL — MEDROXYPROGESTERONE 10 MG T: 10 | 10 days supply | Qty: 10 | Fill #0

## 2019-07-07 NOTE — Telephone Encounter (Signed)
Left patient a message to call and schedule [facility] video visit for today with Dr. Gala Romney. 2:00pm is the only appointment available now.

## 2019-07-07 NOTE — Progress Notes (Signed)
TELEHEALTH GYNECOLOGY VIRTUAL VIDEO VISIT ENCOUNTER NOTE  Provider location: [facility] at [place]   I connected with [name] on 07/07/19 at  2:00 PM EDT by MyChart Video Encounter at home and verified that I am speaking with the correct person using two identifiers.   I discussed the limitations, risks, security and privacy concerns of performing an evaluation and management service virtually and the availability of in person appointments. I also discussed with the patient that there may be a patient responsible charge related to this service. The patient expressed understanding and agreed to proceed.   History:  Shannon Santiago is a 53 y.o. G0P0000 female being evaluated today for follow up of PMB and endometrial biopsy. She denies any abnormal vaginal discharge, pelvic pain or other concerns.       Past Medical History:  Diagnosis Date  . Allergy   . Anxiety   . Depression   . Hyperlipidemia   . Hypertension   . Past heart attack 06/28/2015  . Prolonged PTT (partial thromboplastin time) 06/23/2019   Normal PT  . Thyroid disease    Past Surgical History:  Procedure Laterality Date  . EYE SURGERY     x 5   The following portions of the patient's history were reviewed and updated as appropriate: allergies, current medications, past family history, past medical history, past social history, past surgical history and problem list.    Review of Systems:  Pertinent items noted in HPI and remainder of comprehensive ROS otherwise negative.  Physical Exam:   General:  Alert, oriented and cooperative. Patient appears to be in no acute distress.  Mental Status: Normal mood and affect. Normal behavior. Normal judgment and thought content.   Respiratory: Normal respiratory effort, no problems with respiration noted  Rest of physical exam deferred due to type of encounter  Labs and Imaging No results found for this or any previous visit (from the past  336 hour(s)). US Pelvic Complete With Transvaginal  Result Date: 06/18/2019 CLINICAL DATA:  Postmenopausal bleeding EXAM: TRANSABDOMINAL AND TRANSVAGINAL ULTRASOUND OF PELVIS TECHNIQUE: Both transabdominal and transvaginal ultrasound examinations of the pelvis were performed. Transabdominal technique was performed for global imaging of the pelvis including uterus, ovaries, adnexal regions, and pelvic cul-de-sac. It was necessary to proceed with endovaginal exam following the transabdominal exam to visualize the ovaries. COMPARISON:  None FINDINGS: Uterus Measurements: 8.2 x 5.3 x 4.6 cm = volume: 104 mL. Anteverted, slightly deviated to RIGHT period mildly heterogeneous myometrium. Subserosal leiomyoma at upper uterine segment 3.2 x 2.1 x 3.3 cm. No additional masses. Endometrium Thickness: 14 mm.  Small amount of endometrial fluid. Right ovary Not visualized on either transabdominal or endovaginal imaging, likely obscured by bowel Left ovary Not visualized on either transabdominal or endovaginal imaging, likely obscured by bowel Other findings No free pelvic fluid or adnexal masses. IMPRESSION: Nonvisualization of ovaries. Subserosal leiomyoma at upper uterus 3.3 cm diameter. Abnormal thickening of endometrial complex 14 mm thick with small amount of endometrial fluid. In the setting of post-menopausal bleeding, endometrial sampling is indicated to exclude carcinoma. If results are benign, sonohysterogram should be considered for focal lesion work-up. (Ref: Radiological Reasoning: Algorithmic Workup of Abnormal Vaginal Bleeding with Endovaginal Sonography and Sonohysterography. [id number]) These results will be called to the ordering clinician or representative by the Radiologist Assistant, and communication documented in the PACS or zVision Dashboard. At Electronically Signed   By: Lavonia Dana M.D.   On: 06/18/2019 17:04  Xr Foot 2 Views Left  Result Date: 06/16/2019 First MTP, PIP and DIP joint  space narrowing was noted.  No other MTPs were narrow.  No erosive changes were noted.  No intertarsal, tibiotalar or subtalar joint space narrowing was noted.  Posterior inferior calcaneal spurs were noted. Impression: These findings are consistent with osteoarthritis of the foot.  Xr Foot 2 Views Right  Result Date: 06/16/2019 First MTP, PIP and DIP joint space narrowing was noted.  No erosive changes were noted.  No tibiotalar, subtalar or intertarsal joint space narrowing was noted.  Inferior p calcaneal spur was noted. Impression: These findings are consistent with osteoarthritis of the foot.  Xr Hand 2 View Left  Result Date: 06/16/2019 CMC, PIP and DIP joint space narrowing was noted.  No MCP, intercarpal radiocarpal joint space narrowing was noted.  No erosive changes were noted. Impression: These findings are consistent with osteoarthritis of the hand.  Xr Hand 2 View Right  Result Date: 06/16/2019 CMC, PIP and DIP joint space narrowing was noted.  No MCP, intercarpal radiocarpal joint space narrowing was noted.  No erosive changes were noted. Impression: These findings are consistent with osteoarthritis of the hand.      Assessment and Plan:     1. Postmenopausal vaginal bleeding with secretory benign endometrium and possible polyp - Provera x 10 days, pt to send message is she has bleeding after the provera. - [place] Sonohysterogram; Future (in about 4 weeks)  I discussed the assessment and treatment plan with the patient. The patient was provided an opportunity to ask questions and all were answered. The patient agreed with the plan and demonstrated an understanding of the instructions.   The patient was advised to call back or seek an in-person evaluation/go to the ED if the symptoms worsen or if the condition fails to improve as anticipated.  I provided 15 minutes of face-to-face time during this encounter.   Silas Sacramento, MD [facility], [place]

## 2019-07-08 ENCOUNTER — Ambulatory Visit (INDEPENDENT_AMBULATORY_CARE_PROVIDER_SITE_OTHER): Payer: 59 | Admitting: Physician Assistant

## 2019-07-08 DIAGNOSIS — Z809 Family history of malignant neoplasm, unspecified: Secondary | ICD-10-CM

## 2019-07-08 NOTE — Progress Notes (Signed)
Pt came into clinic for blood draw associated with My[facility] genetic screening test. Forms completed, blood drawn. Insurance information printed and attached. Sample already picked up by [employer].

## 2019-07-09 ENCOUNTER — Encounter: Payer: Self-pay | Admitting: Physician Assistant

## 2019-07-13 ENCOUNTER — Telehealth: Payer: Self-pay | Admitting: Physician Assistant

## 2019-07-13 ENCOUNTER — Encounter: Payer: Self-pay | Admitting: Physician Assistant

## 2019-07-13 NOTE — Telephone Encounter (Signed)
Received information that the genetic testing sent out last week is unable to be processed. The patient label was placed on the incorrect tube.  Left VM for Pt to get her scheduled for recollection

## 2019-07-14 ENCOUNTER — Encounter: Payer: Self-pay | Admitting: Rheumatology

## 2019-07-14 ENCOUNTER — Ambulatory Visit: Payer: 59 | Admitting: Rheumatology

## 2019-07-14 ENCOUNTER — Other Ambulatory Visit: Payer: Self-pay

## 2019-07-14 VITALS — BP 138/89 | HR 73 | Resp 13 | Ht 61.0 in | Wt 159.0 lb

## 2019-07-14 DIAGNOSIS — E782 Mixed hyperlipidemia: Secondary | ICD-10-CM | POA: Diagnosis not present

## 2019-07-14 DIAGNOSIS — M5137 Other intervertebral disc degeneration, lumbosacral region: Secondary | ICD-10-CM | POA: Diagnosis not present

## 2019-07-14 DIAGNOSIS — M542 Cervicalgia: Secondary | ICD-10-CM

## 2019-07-14 DIAGNOSIS — F331 Major depressive disorder, recurrent, moderate: Secondary | ICD-10-CM

## 2019-07-14 DIAGNOSIS — I10 Essential (primary) hypertension: Secondary | ICD-10-CM | POA: Diagnosis not present

## 2019-07-14 DIAGNOSIS — R768 Other specified abnormal immunological findings in serum: Secondary | ICD-10-CM | POA: Diagnosis not present

## 2019-07-14 DIAGNOSIS — J4540 Moderate persistent asthma, uncomplicated: Secondary | ICD-10-CM

## 2019-07-14 DIAGNOSIS — R5383 Other fatigue: Secondary | ICD-10-CM | POA: Diagnosis not present

## 2019-07-14 DIAGNOSIS — M19072 Primary osteoarthritis, left ankle and foot: Secondary | ICD-10-CM

## 2019-07-14 DIAGNOSIS — M19071 Primary osteoarthritis, right ankle and foot: Secondary | ICD-10-CM | POA: Diagnosis not present

## 2019-07-14 DIAGNOSIS — M7918 Myalgia, other site: Secondary | ICD-10-CM | POA: Diagnosis not present

## 2019-07-14 DIAGNOSIS — M19042 Primary osteoarthritis, left hand: Secondary | ICD-10-CM

## 2019-07-14 DIAGNOSIS — M19041 Primary osteoarthritis, right hand: Secondary | ICD-10-CM

## 2019-07-14 DIAGNOSIS — F5101 Primary insomnia: Secondary | ICD-10-CM

## 2019-07-14 DIAGNOSIS — E063 Autoimmune thyroiditis: Secondary | ICD-10-CM

## 2019-07-14 DIAGNOSIS — Z780 Asymptomatic menopausal state: Secondary | ICD-10-CM

## 2019-07-14 NOTE — Patient Instructions (Signed)
Cervical Strain and Sprain Rehab Ask your health care provider which exercises are safe for you. Do exercises exactly as told by your health care provider and adjust them as directed. It is normal to feel mild stretching, pulling, tightness, or discomfort as you do these exercises. Stop right away if you feel sudden pain or your pain gets worse. Do not begin these exercises until told by your health care provider. Stretching and range-of-motion exercises Cervical side bending  1. Using good posture, sit on a stable chair or stand up. 2. Without moving your shoulders, slowly tilt your left / right ear to your shoulder until you feel a stretch in the opposite side neck muscles. You should be looking straight ahead. 3. Hold for __________ seconds. 4. Repeat with the other side of your neck. Repeat __________ times. Complete this exercise __________ times a day. Cervical rotation  1. Using good posture, sit on a stable chair or stand up. 2. Slowly turn your head to the side as if you are looking over your left / right shoulder. ? Keep your eyes level with the ground. ? Stop when you feel a stretch along the side and the back of your neck. 3. Hold for __________ seconds. 4. Repeat this by turning to your other side. Repeat __________ times. Complete this exercise __________ times a day. Thoracic extension and pectoral stretch 1. Roll a towel or a small blanket so it is about 4 inches (10 cm) in diameter. 2. Lie down on your back on a firm surface. 3. Put the towel lengthwise, under your spine in the middle of your back. It should not be under your shoulder blades. The towel should line up with your spine from your middle back to your lower back. 4. Put your hands behind your head and let your elbows fall out to your sides. 5. Hold for __________ seconds. Repeat __________ times. Complete this exercise __________ times a day. Strengthening exercises Isometric upper cervical flexion 1. Lie on  your back with a thin pillow behind your head and a small rolled-up towel under your neck. 2. Gently tuck your chin toward your chest and nod your head down to look toward your feet. Do not lift your head off the pillow. 3. Hold for __________ seconds. 4. Release the tension slowly. Relax your neck muscles completely before you repeat this exercise. Repeat __________ times. Complete this exercise __________ times a day. Isometric cervical extension  1. Stand about 6 inches (15 cm) away from a wall, with your back facing the wall. 2. Place a soft object, about 6-8 inches (15-20 cm) in diameter, between the back of your head and the wall. A soft object could be a small pillow, a ball, or a folded towel. 3. Gently tilt your head back and press into the soft object. Keep your jaw and forehead relaxed. 4. Hold for __________ seconds. 5. Release the tension slowly. Relax your neck muscles completely before you repeat this exercise. Repeat __________ times. Complete this exercise __________ times a day. Posture and body mechanics Body mechanics refers to the movements and positions of your body while you do your daily activities. Posture is part of body mechanics. Good posture and healthy body mechanics can help to relieve stress in your body's tissues and joints. Good posture means that your spine is in its natural S-curve position (your spine is neutral), your shoulders are pulled back slightly, and your head is not tipped forward. The following are general guidelines for applying improved   posture and body mechanics to your everyday activities. Sitting  1. When sitting, keep your spine neutral and keep your feet flat on the floor. Use a footrest, if necessary, and keep your thighs parallel to the floor. Avoid rounding your shoulders, and avoid tilting your head forward. 2. When working at a desk or a computer, keep your desk at a height where your hands are slightly lower than your elbows. Slide your  chair under your desk so you are close enough to maintain good posture. 3. When working at a computer, place your monitor at a height where you are looking straight ahead and you do not have to tilt your head forward or downward to look at the screen. Standing   When standing, keep your spine neutral and keep your feet about hip-width apart. Keep a slight bend in your knees. Your ears, shoulders, and hips should line up.  When you do a task in which you stand in one place for a long time, place one foot up on a stable object that is 2-4 inches (5-10 cm) high, such as a footstool. This helps keep your spine neutral. Resting When lying down and resting, avoid positions that are most painful for you. Try to support your neck in a neutral position. You can use a contour pillow or a small rolled-up towel. Your pillow should support your neck but not push on it. This information is not intended to replace advice given to you by your health care provider. Make sure you discuss any questions you have with your health care provider. Document Released: 10/07/2005 Document Revised: 01/27/2019 Document Reviewed: 07/08/2018 Elsevier Patient Education  2020 [place]. Back Exercises The following exercises strengthen the muscles that help to support the trunk and back. They also help to keep the lower back flexible. Doing these exercises can help to prevent back pain or lessen existing pain.  If you have back pain or discomfort, try doing these exercises 2-3 times each day or as told by your health care provider.  As your pain improves, do them once each day, but increase the number of times that you repeat the steps for each exercise (do more repetitions).  To prevent the recurrence of back pain, continue to do these exercises once each day or as told by your health care provider. Do exercises exactly as told by your health care provider and adjust them as directed. It is normal to feel mild stretching,  pulling, tightness, or discomfort as you do these exercises, but you should stop right away if you feel sudden pain or your pain gets worse. Exercises Single knee to chest Repeat these steps 3-5 times for each leg: 1. Lie on your back on a firm bed or the floor with your legs extended. 2. Bring one knee to your chest. Your other leg should stay extended and in contact with the floor. 3. Hold your knee in place by grabbing your knee or thigh with both hands and hold. 4. Pull on your knee until you feel a gentle stretch in your lower back or buttocks. 5. Hold the stretch for 10-30 seconds. 6. Slowly release and straighten your leg. Pelvic tilt Repeat these steps 5-10 times: 1. Lie on your back on a firm bed or the floor with your legs extended. 2. Bend your knees so they are pointing toward the ceiling and your feet are flat on the floor. 3. Tighten your lower abdominal muscles to press your lower back against the floor. This  motion will tilt your pelvis so your tailbone points up toward the ceiling instead of pointing to your feet or the floor. 4. With gentle tension and even breathing, hold this position for 5-10 seconds. Cat-cow Repeat these steps until your lower back becomes more flexible: 1. Get into a hands-and-knees position on a firm surface. Keep your hands under your shoulders, and keep your knees under your hips. You may place padding under your knees for comfort. 2. Let your head hang down toward your chest. Contract your abdominal muscles and point your tailbone toward the floor so your lower back becomes rounded like the back of a cat. 3. Hold this position for 5 seconds. 4. Slowly lift your head, let your abdominal muscles relax and point your tailbone up toward the ceiling so your back forms a sagging arch like the back of a cow. 5. Hold this position for 5 seconds.  Press-ups Repeat these steps 5-10 times: 1. Lie on your abdomen (face-down) on the floor. 2. Place your palms  near your head, about shoulder-width apart. 3. Keeping your back as relaxed as possible and keeping your hips on the floor, slowly straighten your arms to raise the top half of your body and lift your shoulders. Do not use your back muscles to raise your upper torso. You may adjust the placement of your hands to make yourself more comfortable. 4. Hold this position for 5 seconds while you keep your back relaxed. 5. Slowly return to lying flat on the floor.  Bridges Repeat these steps 10 times: 1. Lie on your back on a firm surface. 2. Bend your knees so they are pointing toward the ceiling and your feet are flat on the floor. Your arms should be flat at your sides, next to your body. 3. Tighten your buttocks muscles and lift your buttocks off the floor until your waist is at almost the same height as your knees. You should feel the muscles working in your buttocks and the back of your thighs. If you do not feel these muscles, slide your feet 1-2 inches farther away from your buttocks. 4. Hold this position for 3-5 seconds. 5. Slowly lower your hips to the starting position, and allow your buttocks muscles to relax completely. If this exercise is too easy, try doing it with your arms crossed over your chest. Abdominal crunches Repeat these steps 5-10 times: 1. Lie on your back on a firm bed or the floor with your legs extended. 2. Bend your knees so they are pointing toward the ceiling and your feet are flat on the floor. 3. Cross your arms over your chest. 4. Tip your chin slightly toward your chest without bending your neck. 5. Tighten your abdominal muscles and slowly raise your trunk (torso) high enough to lift your shoulder blades a tiny bit off the floor. Avoid raising your torso higher than that because it can put too much stress on your low back and does not help to strengthen your abdominal muscles. 6. Slowly return to your starting position. Back lifts Repeat these steps 5-10 times:  1. Lie on your abdomen (face-down) with your arms at your sides, and rest your forehead on the floor. 2. Tighten the muscles in your legs and your buttocks. 3. Slowly lift your chest off the floor while you keep your hips pressed to the floor. Keep the back of your head in line with the curve in your back. Your eyes should be looking at the floor. 4. Hold this  position for 3-5 seconds. 5. Slowly return to your starting position. Contact a health care provider if:  Your back pain or discomfort gets much worse when you do an exercise.  Your worsening back pain or discomfort does not lessen within 2 hours after you exercise. If you have any of these problems, stop doing these exercises right away. Do not do them again unless your health care provider says that you can. Get help right away if:  You develop sudden, severe back pain. If this happens, stop doing the exercises right away. Do not do them again unless your health care provider says that you can. This information is not intended to replace advice given to you by your health care provider. Make sure you discuss any questions you have with your health care provider. Document Released: 11/14/2004 Document Revised: 02/11/2019 Document Reviewed: 07/09/2018 Elsevier Patient Education  2020 [employer].

## 2019-07-16 ENCOUNTER — Ambulatory Visit (INDEPENDENT_AMBULATORY_CARE_PROVIDER_SITE_OTHER): Payer: 59 | Admitting: Family Medicine

## 2019-07-16 ENCOUNTER — Other Ambulatory Visit: Payer: Self-pay

## 2019-07-16 DIAGNOSIS — Z809 Family history of malignant neoplasm, unspecified: Secondary | ICD-10-CM

## 2019-07-16 DIAGNOSIS — Z803 Family history of malignant neoplasm of breast: Secondary | ICD-10-CM | POA: Diagnosis not present

## 2019-07-16 DIAGNOSIS — Z8042 Family history of malignant neoplasm of prostate: Secondary | ICD-10-CM | POA: Diagnosis not present

## 2019-07-16 DIAGNOSIS — Z801 Family history of malignant neoplasm of trachea, bronchus and lung: Secondary | ICD-10-CM | POA: Diagnosis not present

## 2019-07-16 DIAGNOSIS — Z800 Family history of malignant neoplasm of digestive organs: Secondary | ICD-10-CM | POA: Diagnosis not present

## 2019-07-16 NOTE — Progress Notes (Signed)
Agree with documentation as above.   Wilkie Zenon, MD  

## 2019-07-16 NOTE — Progress Notes (Signed)
Pt came into clinic for blood draw associated with MyRisk genetic screening test. Forms completed, blood drawn. Insurance information printed and attached. Sample will be picked up by [employer].

## 2019-08-02 DIAGNOSIS — N950 Postmenopausal bleeding: Secondary | ICD-10-CM | POA: Diagnosis not present

## 2019-08-02 MED FILL — DULoxetine HCL 40 MG CPEP: 40 | 30 days supply | Qty: 30 | Fill #1

## 2019-08-09 ENCOUNTER — Telehealth: Payer: Self-pay | Admitting: *Deleted

## 2019-08-09 NOTE — Telephone Encounter (Signed)
Left patient a message to call the office to schedule appointment with Dr. Hulan Fray per Dr. Gala Romney.

## 2019-08-09 NOTE — Telephone Encounter (Signed)
-----   Message from Guss Bunde, MD sent at 08/09/2019  6:47 AM EDT ----- Alda Berthold, Can you schedule Jen with Dr. Hulan Fray.  She has an endometrial polyp.  Given what is going on with my parents, my schedule is changing constantly and I might be going out on a leave.   Thanks, [facility]

## 2019-08-12 ENCOUNTER — Ambulatory Visit (INDEPENDENT_AMBULATORY_CARE_PROVIDER_SITE_OTHER): Payer: 59 | Admitting: Osteopathic Medicine

## 2019-08-12 ENCOUNTER — Encounter: Payer: Self-pay | Admitting: Osteopathic Medicine

## 2019-08-12 ENCOUNTER — Other Ambulatory Visit: Payer: Self-pay

## 2019-08-12 VITALS — BP 151/100 | HR 69 | Temp 98.0°F | Wt 161.1 lb

## 2019-08-12 DIAGNOSIS — F331 Major depressive disorder, recurrent, moderate: Secondary | ICD-10-CM

## 2019-08-12 DIAGNOSIS — Z809 Family history of malignant neoplasm, unspecified: Secondary | ICD-10-CM | POA: Diagnosis not present

## 2019-08-12 DIAGNOSIS — M797 Fibromyalgia: Secondary | ICD-10-CM

## 2019-08-12 MED ORDER — DULOXETINE HCL 60 MG PO CPEP: ORAL_CAPSULE | ORAL | 0 refills | Status: DC

## 2019-08-12 MED FILL — DULOXETINE HCL 60 MG CPEP: 60 | 55 days supply | Qty: 90 | Fill #0

## 2019-08-12 NOTE — Progress Notes (Signed)
HPI: Shannon Santiago is a 53 y.o. female who  has a past medical history of Allergy, Anxiety, Depression, Hyperlipidemia, Hypertension, Past heart attack (06/28/2015), Prolonged PTT (partial thromboplastin time) (06/23/2019), and Thyroid disease.  she presents to [facility] today, 08/12/19,  for chief complaint of: anxiety/mood   Patient is having frequent episodes of severe anxiety and panic attacks that are impairing daily functioning. Experiences episodes of palpitations, poor sleep, increased appetite, fatigue, headaches, anger and irritability. Endorses thoughts of death and dying but denies a plan.   Currently taking Wellbutrin 150 mg and Cymbalta 40 mg for anxiety and depression. Reports that both work well however since starting Wellbutrin has had persistent muscle spasms not relieved by baclofen or exercise so would prefer to stop that medication. Also would like to increase Cymbalta since 40 mg tablets are difficult to obtain at the pharmacy. Has been on Cymbalta 60 mg twice a day in the past which worked well. Also trialed Effexor XR in the past which worked well but was taken off due to provider concerns of side effects.    Past medical, surgical, social and family history reviewed and updated as necessary.   Current medication list and allergy/intolerance information reviewed:    Current Outpatient Medications  Medication Sig Dispense Refill  . Albuterol Sulfate (PROAIR RESPICLICK) [id number] (90 Base) MCG/ACT AEPB Inhale 1-2 puffs into the lungs every 4 (four) hours as needed. 1 each 4  . amLODipine (NORVASC) 5 MG tablet Take 1 tablet (5 mg total) by mouth daily. 90 tablet 1  . Ascorbic Acid (VITAMIN C) 1000 MG tablet Take 1,000 mg by mouth daily.    Marland Kitchen b complex vitamins tablet Take 1 tablet by mouth daily.    Marland Kitchen buPROPion (WELLBUTRIN) 75 MG tablet Take 1 tablet (75 mg total) by mouth 2 (two) times daily. 180 tablet 1  . CALCIUM PO Take by mouth.    .  Cholecalciferol (VITAMIN D3) 10 MCG (400 UNIT) tablet Take 400 Units by mouth daily.    . DULoxetine (CYMBALTA) 30 MG capsule Take 2 capsules (60 mg total) by mouth daily. (Patient taking differently: Take 40 mg by mouth daily. ) 60 capsule 2  . EPINEPHrine 0.3 mg/0.3 mL IJ SOAJ injection Inject 0.3 mLs (0.3 mg total) into the muscle once as needed (anaphylaxis/allergic reaction). 1 each 3  . EVENING PRIMROSE OIL PO Take by mouth.    . Ferrous Sulfate (IRON PO) Take by mouth.    . fexofenadine (ALLEGRA) 180 MG tablet Take by mouth.    . Ginger, Zingiber officinalis, (GINGER ROOT PO) Take by mouth.    . levothyroxine (SYNTHROID) 50 MCG tablet Take 1 tablet (50 mcg total) by mouth daily before breakfast. 90 tablet 3  . lisinopril (ZESTRIL) 20 MG tablet Take 1 tablet (20 mg total) by mouth daily. 90 tablet 1  . Magnesium 500 MG CAPS Take by mouth.    . Multiple Vitamin (MULTIVITAMIN) capsule Take 1 capsule by mouth daily.    . naproxen (NAPROSYN) 375 MG tablet Take 1 tablet (375 mg total) by mouth 2 (two) times daily with a meal. (Patient taking differently: Take 375 mg by mouth as needed. ) 30 tablet 0  . Omega-3 Fatty Acids (FISH OIL) 1000 MG CAPS Take by mouth.    . Probiotic Product (PROBIOTIC ADVANCED PO) Take by mouth.    . Red Yeast Rice Extract (RED YEAST RICE PO) Take by mouth.    . baclofen (LIORESAL) 10 MG  tablet Take 1 tablet (10 mg total) by mouth 3 (three) times daily as needed for muscle spasms (headache). (Patient not taking: Reported on 08/12/2019) 60 each 2  . medroxyPROGESTERone (PROVERA) 10 MG tablet Take 1 tablet (10 mg total) by mouth daily. Use for ten days (Patient not taking: Reported on 08/12/2019) 10 tablet 0   No current facility-administered medications for this visit.     Allergies  Allergen Reactions  . Morphine And Related Other (See Comments)    Numb hands and feet  . Peanut Oil Other (See Comments)    Stomach cramping, hand/foot swelling  . Penicillins Hives   . Polymyxin B-Trimethoprim Other (See Comments), Photosensitivity and Swelling  . Citalopram Other (See Comments)    SSRI's ineffective - Citalopram, Fluoxetine, Sertraline, Excitalopram   . Food Nausea Only, Swelling, Palpitations, Other (See Comments) and Cough    WHEAT  . Singulair [Montelukast Sodium] Other (See Comments)    Mood changes, worsening depression      Review of Systems:  Constitutional:  Fatigue, increased appetite, weight gain per HPI.   HEENT: Headache  Cardiac: Palpitations per HPI, No  chest pain, No  pressure  Respiratory:  No  shortness of breath. No  Cough  Gastrointestinal: No  abdominal pain, No  nausea, No  Vomiting  Psych: Anxiety and depression per HPI  Exam:  BP (!) 151/100 (BP Location: Right Arm, Patient Position: Sitting, Cuff Size: Normal)   Pulse 69   Temp 98 F (36.7 C) (Oral)   Wt 161 lb 1.9 oz (73.1 kg)   LMP 06/18/2017   BMI 30.44 kg/m   Constitutional: VS see above. General Appearance: alert, well-developed, well-nourished, NAD  Eyes: Normal lids and conjunctive, non-icteric sclera   Respiratory: Normal respiratory effort. no wheeze, no rhonchi, no rales  Cardiovascular: S1/S2 normal, no murmur, no rub/gallop auscultated. RRR.  Musculoskeletal: Gait normal.   Neurological: Normal balance/coordination. No tremor.   Skin: warm, dry, intact.   Psychiatric: Normal judgment/insight. Sad mood, euthymic, full affect. A&O x 3.   Depression screen [facility] 2/9 08/12/2019 07/01/2019 06/03/2019  Decreased Interest 3 1 3   Down, Depressed, Hopeless 2 1 3   PHQ - 2 Score 5 2 6   Altered sleeping 3 3 3   Tired, decreased energy 3 3 3   Change in appetite 2 0 1  Feeling bad or failure about yourself  2 1 2   Trouble concentrating 3 2 3   Moving slowly or fidgety/restless 2 0 1  Suicidal thoughts 1 0 0  PHQ-9 Score 21 11 19   Difficult doing work/chores Somewhat difficult - Very difficult   GAD 7 : Generalized Anxiety Score 08/12/2019  07/01/2019 06/03/2019 05/06/2019  Nervous, Anxious, on Edge 3 2 2 3   Control/stop worrying 1 1 2 2   Worry too much - different things 2 1 2 1   Trouble relaxing 3 3 3 2   Restless 3 3 2 1   Easily annoyed or irritable 3 1 2 3   Afraid - awful might happen 1 1 2 3   Total GAD 7 Score 16 12 15 15   Anxiety Difficulty Somewhat difficult - Somewhat difficult -      No results found for this or any previous visit (from the past 72 hour(s)).  No results found.   ASSESSMENT/PLAN: The primary encounter diagnosis was Moderate episode of recurrent major depressive disorder ([place]). Diagnoses of Family history of cancer and Fibromyalgia were also pertinent to this visit.   Will stop Wellbutrin given side effects and patient preference. Increase Cymbalta  to 60 mg daily for 10 days and have patient self titrate up to 60 mg BID. Plan to follow up in 4 weeks or sooner if needed.   There was an issue with the genetic testing being not covered by insurance and expensive.  Meds ordered this encounter  Medications  . DULoxetine (CYMBALTA) 60 MG capsule    Sig: Take 1 capsule (60 mg total) by mouth daily for 10 days, THEN 2 capsules (120 mg total) daily.    Dispense:  90 capsule    Refill:  0    Orders Placed This Encounter  Procedures  . Ambulatory referral to Genetics       Visit summary with medication list and pertinent instructions was printed for patient to review. All questions at time of visit were answered - patient instructed to contact office with any additional concerns or updates. ER/RTC precautions were reviewed with the patient.   Follow-up plan: Return in about 4 weeks (around 09/09/2019) for RECHECK ON NEW MEDICATION DOSE FOR CYMBALTA - CALL / MESSAGE [place] SOONER IF NEEDED !Marland Kitchen  Note: Total time spent 25 minutes, greater than 50% of the visit was spent face-to-face counseling and coordinating care for the following: The primary encounter diagnosis was Moderate episode of recurrent major  depressive disorder ([place]). Diagnoses of Family history of cancer and Fibromyalgia were also pertinent to this visit.Marland Kitchen  Please note: voice recognition software was used to produce this document, and typos may escape review. Please contact Dr. Sheppard Coil for any needed clarifications.

## 2019-08-16 ENCOUNTER — Telehealth: Payer: Self-pay | Admitting: Licensed Clinical Social Worker

## 2019-08-16 NOTE — Telephone Encounter (Signed)
A genetic counseling appt has been scheduled for Ms. Ende to see Faith Rogue on 11/2 at 1pm. Pt has been cld and agreed to the appt date and time. She's been made aware to arrive 15 minutes early.

## 2019-08-18 ENCOUNTER — Ambulatory Visit (INDEPENDENT_AMBULATORY_CARE_PROVIDER_SITE_OTHER): Payer: 59 | Admitting: Obstetrics & Gynecology

## 2019-08-18 ENCOUNTER — Other Ambulatory Visit: Payer: Self-pay

## 2019-08-18 ENCOUNTER — Encounter: Payer: Self-pay | Admitting: Obstetrics & Gynecology

## 2019-08-18 VITALS — BP 136/93 | HR 79 | Ht 61.0 in | Wt 159.0 lb

## 2019-08-18 DIAGNOSIS — N950 Postmenopausal bleeding: Secondary | ICD-10-CM | POA: Diagnosis not present

## 2019-08-18 DIAGNOSIS — N840 Polyp of corpus uteri: Secondary | ICD-10-CM

## 2019-08-18 MED ORDER — MISOPROSTOL 200 MCG PO TABS: 200.0000 ug | ORAL_TABLET | Freq: Four times a day (QID) | ORAL | 0 refills | Status: DC

## 2019-08-18 MED FILL — miSOPROStol 200 MCG TABS: 200 | 1 days supply | Qty: 2 | Fill #0

## 2019-08-18 NOTE — Progress Notes (Addendum)
   Subjective:    Patient ID: Shannon Santiago, female    DOB: 1966-01-28, 53 y.o.   MRN: [id number]  HPI 53 yo single P0 here today to discuss PMB and the subsequent work up. She went through menopause in 2018 and then had an occasion of PMB on 8/20. She saw Dr. Gala Romney and had an [facility] that was benign with endometrial polyp. She had also had a gyn u/s that showed a 14 mm endometrium. She took 10 days of provera and has not seen any bleeding since.  She had a sonohysterogram with Dr. Kerin Perna and it showed a 0.6 cm polypoid filling defect.  Review of Systems FH- + prostate and squamous skin cancer in father, paternal GF with prostate cancer + lung cancer in her mom, + colon cancer in her maternal GM + breast in a first cousin    Objective:   Physical Exam Breathing, conversing, and ambulating normally Well nourished, well hydrated White female, no apparent distress     Assessment & Plan:  + family hx of cancer- she plans genetic testing and has an appt for this Uterine polyp- I discussed watchful waiting versus h/s, d&c and polyp removal. She opts for surgical management. I will pretreat with cytotec 200 mcg the night prior to procedure.

## 2019-08-18 NOTE — Addendum Note (Signed)
Addended by: Emily Filbert on: 08/18/2019 11:04 AM   Modules accepted: Orders

## 2019-08-19 ENCOUNTER — Ambulatory Visit: Payer: 59

## 2019-08-20 ENCOUNTER — Other Ambulatory Visit: Payer: Self-pay | Admitting: Licensed Clinical Social Worker

## 2019-08-20 ENCOUNTER — Telehealth: Payer: Self-pay | Admitting: Licensed Clinical Social Worker

## 2019-08-20 DIAGNOSIS — Z809 Family history of malignant neoplasm, unspecified: Secondary | ICD-10-CM

## 2019-08-20 NOTE — Telephone Encounter (Signed)
Called patient regarding upcoming Web[facility] appointment, patient would prefer this to be a walk-in visit. °

## 2019-08-23 ENCOUNTER — Inpatient Hospital Stay: Payer: 59

## 2019-08-23 ENCOUNTER — Inpatient Hospital Stay: Payer: 59 | Attending: Genetic Counselor | Admitting: Licensed Clinical Social Worker

## 2019-08-23 ENCOUNTER — Other Ambulatory Visit: Payer: Self-pay

## 2019-08-23 DIAGNOSIS — Z800 Family history of malignant neoplasm of digestive organs: Secondary | ICD-10-CM | POA: Diagnosis not present

## 2019-08-23 DIAGNOSIS — Z801 Family history of malignant neoplasm of trachea, bronchus and lung: Secondary | ICD-10-CM | POA: Diagnosis not present

## 2019-08-23 DIAGNOSIS — Z8042 Family history of malignant neoplasm of prostate: Secondary | ICD-10-CM | POA: Diagnosis not present

## 2019-08-23 DIAGNOSIS — Z803 Family history of malignant neoplasm of breast: Secondary | ICD-10-CM

## 2019-08-23 DIAGNOSIS — Z809 Family history of malignant neoplasm, unspecified: Secondary | ICD-10-CM

## 2019-08-24 ENCOUNTER — Encounter: Payer: Self-pay | Admitting: Licensed Clinical Social Worker

## 2019-08-24 DIAGNOSIS — Z800 Family history of malignant neoplasm of digestive organs: Secondary | ICD-10-CM | POA: Insufficient documentation

## 2019-08-24 DIAGNOSIS — Z801 Family history of malignant neoplasm of trachea, bronchus and lung: Secondary | ICD-10-CM | POA: Insufficient documentation

## 2019-08-24 DIAGNOSIS — Z8042 Family history of malignant neoplasm of prostate: Secondary | ICD-10-CM | POA: Insufficient documentation

## 2019-08-24 DIAGNOSIS — Z803 Family history of malignant neoplasm of breast: Secondary | ICD-10-CM | POA: Insufficient documentation

## 2019-08-24 NOTE — Progress Notes (Addendum)
REFERRING PROVIDER: Emeterio Reeve, DO [address]  [place] [address]  PRIMARY PROVIDER:  Emeterio Reeve, DO  PRIMARY REASON FOR VISIT:  1. Family history of breast cancer   2. Family history of lung cancer   3. Family history of colon cancer   4. Family history of prostate cancer      HISTORY OF PRESENT ILLNESS:   Shannon Santiago, a 53 y.o. female, was seen for a [facility] cancer genetics consultation at the request of Dr. Sheppard Santiago due to a family history of cancer.  Shannon Santiago presents to clinic today to discuss the possibility of a hereditary predisposition to cancer, genetic testing, and to further clarify her future cancer risks, as well as potential cancer risks for family members.   Shannon Santiago is a 53 y.o. female with no personal history of cancer.  She has been experiencing postmenopausal bleeding that she reports is still being assessed. She had blood drawn through her PCP's office for [facility] but ultimately decided not to pursue that testing due to cost.   CANCER HISTORY:  Oncology History   No history exists.     RISK FACTORS:  Menarche was at age 80.  OCP use for approximately several months. Ovaries intact: Yes Hysterectomy: Yes  Menopausal status: postmenopausal, 2018.  HRT use: 0 years. Colonoscopy: Yes, in 2017, normal.  Mammogram within the last year: Yes. Up to date with pelvic exams: Yes Any excessive radiation exposure in the past: no.  Past Medical History:  Diagnosis Date  . Allergy   . Anxiety   . Depression   . Family history of breast cancer   . Family history of colon cancer   . Family history of lung cancer   . Family history of prostate cancer   . Hyperlipidemia   . Hypertension   . Past heart attack 06/28/2015  . Prolonged PTT (partial thromboplastin time) 06/23/2019   Normal PT  . Thyroid disease     Past Surgical History:  Procedure Laterality Date  . ENDOMETRIAL BIOPSY  06/21/2019  . EYE SURGERY     x 5    Social History   Socioeconomic History  . Marital status: Single    Spouse name: Not on file  . Number of children: Not on file  . Years of education: Not on file  . Highest education level: Not on file  Occupational History  . Not on file  Social Needs  . Financial resource strain: Not on file  . Food insecurity    Worry: Not on file    Inability: Not on file  . Transportation needs    Medical: Not on file    Non-medical: Not on file  Tobacco Use  . Smoking status: Never Smoker  . Smokeless tobacco: Never Used  Substance and Sexual Activity  . Alcohol use: Yes    Comment: occ  . Drug use: Never  . Sexual activity: Not Currently    Birth control/protection: Abstinence  Lifestyle  . Physical activity    Days per week: Not on file    Minutes per session: Not on file  . Stress: Not on file  Relationships  . Social Herbalist on phone: Not on file    Gets together: Not on file    Attends religious service: Not on file    Active member of club or organization: Not on file    Attends meetings of clubs or organizations: Not on file  Relationship status: Not on file  Other Topics Concern  . Not on file  Social History Narrative  . Not on file     FAMILY HISTORY:  We obtained a detailed, 4-generation family history.  Significant diagnoses are listed below: Family History  Problem Relation Age of Onset  . Hypertension Mother   . Stroke Mother   . Lung cancer Mother 71  . Anxiety disorder Mother   . Depression Mother   . Alcohol abuse Mother   . Skin cancer Father   . Prostate cancer Father 55  . Hypertension Father   . Diabetes Father   . Thyroid disease Maternal Aunt   . Thyroid disease Maternal Uncle   . Diabetes Brother   . Hypertension Brother   . Colon cancer Maternal Grandmother        dx 49s, d. 79s  . Prostate cancer Paternal Grandfather 58  . Breast cancer Cousin        dx 72s-30s   Shannon Santiago does not have children. She has  one full brother, 42, who has not had cancer. She has a maternal half brother who has also not had cancer.  Shannon Santiago mother was diagnosed with lung cancer at 35 and died shortly after diagnosis. Patient had 3 maternal aunts and 1 maternal uncle. One of her aunts, who is living at 15, has a daughter who was diagnosed with breast cancer in her 31s-30s and is living at 36. Another maternal cousin had cancer in their 6s but patient is unsure the cancer type. Patient's maternal grandmother was diagnosed with colon cancer in her 28s and died in her 46s. Maternal grandfather died due to heart issues. Patient also reports that many members of this side of the family had mental illness.   Shannon Santiago father is living at 37. He had prostate cancer at 44 and has had several squamous cell carcinoma skin cancers. Patient had 2 paternal uncles, no cancers. Paternal grandfather had prostate cancer in his 33s. Paternal grandmother died due to heart issues.   Shannon Santiago is unaware of previous family history of genetic testing for hereditary cancer risks. Patient's ancestors are of English descent. There is no reported Ashkenazi Jewish ancestry. There is no known consanguinity.  GENETIC COUNSELING ASSESSMENT: Shannon Santiago is a 53 y.o. female with a family history of young cancer diagnoses which is somewhat suggestive of a hereditary cancer syndrome and predisposition to cancer. We, therefore, discussed and recommended the following at today's visit.   DISCUSSION: We discussed that 5-10% of cancer is hereditary. There are genes associated with hereditary breast cancer, hereditary colon cancer, hereditary prostate cancer and other types of cancer not seen in Shannon Santiago's family. We discussed that testing is beneficial for several reasons including knowing how to follow individuals for cancer screenings and understand if other family members could be at risk for cancer and allow them to undergo genetic testing.   We reviewed  the characteristics, features and inheritance patterns of hereditary cancer syndromes. We also discussed genetic testing, including the appropriate family members to test, the process of testing, insurance coverage and turn-around-time for results. We discussed the implications of a negative, positive and/or variant of uncertain significant result. We recommended Shannon Santiago pursue genetic testing for the CancerNext-Expanded + RNAinsight gene panel.   The CancerNext-Expanded + RNAinsight gene panel offered by [employer] and includes sequencing and rearrangement analysis for the following 77 genes: IP, ALK, APC*, ATM*, AXIN2, BAP1, BARD1, BLM, BMPR1A, BRCA1*, BRCA2*, BRIP1*,  CDC73, CDH1*,CDK4, CDKN1B, CDKN2A, CHEK2*, CTNNA1, DICER1, FANCC, FH, FLCN, GALNT12, KIF1B, LZTR1, MAX, MEN1, MET, MLH1*, MSH2*, MSH3, MSH6*, MUTYH*, NBN, NF1*, NF2, NTHL1, PALB2*, PHOX2B, PMS2*, POT1, PRKAR1A, PTCH1, PTEN*, RAD51C*, RAD51D*,RB1, RECQL, RET, SDHA, SDHAF2, SDHB, SDHC, SDHD, SMAD4, SMARCA4, SMARCB1, SMARCE1, STK11, SUFU, TMEM127, TP53*,TSC1, TSC2, VHL and XRCC2 (sequencing and deletion/duplication); EGFR, EGLN1, HOXB13, KIT, MITF, PDGFRA, POLD1 and POLE (sequencing only); EPCAM and GREM1 (deletion/duplication only). DNA and RNA analyses performed for * genes.  Based on Shannon Santiago's family history of cancer, she meets medical criteria for genetic testing. Despite that she meets criteria, she may still have an out of pocket cost. We discussed that if her out of pocket cost for testing is over $100, the laboratory will call and confirm whether she wants to proceed with testing.  If the out of pocket cost of testing is less than $100 she will be billed by the genetic testing laboratory.   We discussed that some people do not want to undergo genetic testing due to fear of genetic discrimination.  A federal law called the Genetic Information Non-Discrimination Act (GINA) of 2008 helps protect individuals against genetic  discrimination based on their genetic test results.  It impacts both health insurance and employment.  For health insurance, it protects against increased premiums, being kicked off insurance or being forced to take a test in order to be insured.  For employment it protects against hiring, firing and promoting decisions based on genetic test results.  Health status due to a cancer diagnosis is not protected under GINA.  This law does not protect life insurance, disability insurance, or other types of insurance.   Additionally, psychiatric genetic counseling was also discussed briefly with Shannon Santiago given her personal and family history of mental illness. She was not interested in pursuing this. Pharmacogenetic testing was also briefly discussed since she mentioned she has tried many different types of antideppresant medications that have not worked for her. She plans to discuss this further with her doctor.  PLAN: After considering the risks, benefits, and limitations, Shannon Santiago provided informed consent to pursue genetic testing and the blood sample was sent to [facility] for analysis of the CancerNext-Expanded+RNAinsight panel.  Results should be available within approximately 2-3 weeks' time, at which point they will be disclosed by telephone to Shannon Santiago, as will any additional recommendations warranted by these results. Shannon Santiago will receive a summary of her genetic counseling visit and a copy of her results once available. This information will also be available in Epic.    Shannon Santiago questions were answered to her satisfaction today. Our contact information was provided should additional questions or concerns arise. Thank you for the referral and allowing [place] to share in the care of your patient.   Faith Rogue, MS, [facility] Genetic Counselor [email or web address] Phone: [phone]  The patient was seen for a total of 60 minutes in face-to-face genetic counseling.  [facility] intern  Kary Kos was also present and assisted with this case. Drs. Magrinat, Lindi Adie and/or Burr Medico were available for discussion regarding this case.   _______________________________________________________________________ For Office Staff:  Number of people involved in session: 2 Was an Intern/ student involved with case: Yes

## 2019-08-25 ENCOUNTER — Ambulatory Visit (INDEPENDENT_AMBULATORY_CARE_PROVIDER_SITE_OTHER): Payer: 59

## 2019-08-25 ENCOUNTER — Other Ambulatory Visit: Payer: Self-pay

## 2019-08-25 ENCOUNTER — Telehealth: Payer: Self-pay | Admitting: Osteopathic Medicine

## 2019-08-25 DIAGNOSIS — Z1231 Encounter for screening mammogram for malignant neoplasm of breast: Secondary | ICD-10-CM

## 2019-08-25 LAB — GENETIC SCREENING ORDER

## 2019-08-25 NOTE — Telephone Encounter (Signed)
I received a letter in the mail for this patient from [employer]. I have placed a copy in your box. The patient is aware we will call her once you have looked over the paperwork. FYI.

## 2019-09-03 NOTE — Telephone Encounter (Signed)
Please call patient: Genetic testing was negative for any mutation associated with increased risk of breast cancer.  We kept a copy on file in her chart.

## 2019-09-03 NOTE — Telephone Encounter (Signed)
Patient advised.

## 2019-09-09 DIAGNOSIS — E782 Mixed hyperlipidemia: Secondary | ICD-10-CM | POA: Diagnosis not present

## 2019-09-09 DIAGNOSIS — I10 Essential (primary) hypertension: Secondary | ICD-10-CM | POA: Diagnosis not present

## 2019-09-10 ENCOUNTER — Encounter: Payer: Self-pay | Admitting: Osteopathic Medicine

## 2019-09-10 ENCOUNTER — Telehealth (INDEPENDENT_AMBULATORY_CARE_PROVIDER_SITE_OTHER): Payer: 59 | Admitting: Osteopathic Medicine

## 2019-09-10 VITALS — BP 117/78 | HR 86 | Wt 159.0 lb

## 2019-09-10 DIAGNOSIS — E7801 Familial hypercholesterolemia: Secondary | ICD-10-CM | POA: Diagnosis not present

## 2019-09-10 DIAGNOSIS — I10 Essential (primary) hypertension: Secondary | ICD-10-CM

## 2019-09-10 DIAGNOSIS — F331 Major depressive disorder, recurrent, moderate: Secondary | ICD-10-CM

## 2019-09-10 DIAGNOSIS — F411 Generalized anxiety disorder: Secondary | ICD-10-CM | POA: Diagnosis not present

## 2019-09-10 DIAGNOSIS — I491 Atrial premature depolarization: Secondary | ICD-10-CM | POA: Insufficient documentation

## 2019-09-10 LAB — LIPID PANEL
Chol/HDL Ratio: 6.2 ratio — ABNORMAL HIGH (ref 0.0–4.4)
Cholesterol, Total: 265 mg/dL — ABNORMAL HIGH (ref 100–199)
HDL: 43 mg/dL (ref >39)
LDL Chol Calc (NIH): 205 mg/dL — ABNORMAL HIGH (ref 0–99)
Triglycerides: 99 mg/dL (ref 0–149)
VLDL Cholesterol Cal: 17 mg/dL (ref 5–40)

## 2019-09-10 MED ORDER — BUSPIRONE HCL 10 MG PO TABS: ORAL_TABLET | ORAL | 0 refills | Status: DC

## 2019-09-10 MED FILL — busPIRone HCL 10 MG TABS: 10 | 32 days supply | Qty: 60 | Fill #0

## 2019-09-10 NOTE — Patient Instructions (Addendum)
Plan:  For anxiety:  Let's continue on the Cymbalta 60 mg twice per day.  I sent a prescription for BuSpar/buspirone to act as an adjunctive therapy for anxiety.  Let us start at a fairly low dose of this medication and can bump up gradually.  Will start at 5 mg/half a tablet twice daily for about a week then can bump up to 10 mg/whole tablet twice daily from there.    You should get a MyChart message in a couple weeks reminding you to update me with how you are doing on the medication, but if you are having any problems/side effects or other concerns sooner than that, please give [place] a call or send me a message!  For cholesterol, I would speak with your cardiologist about alternatives to statin, they may be able to give you some more information on some of the injectable medications

## 2019-09-10 NOTE — Progress Notes (Signed)
Virtual Visit via Video (App used: MyChart) Note  I connected with      Shannon Santiago on 09/10/19 at 9:00 by a telemedicine application and verified that I am speaking with the correct person using two identifiers.  Patient is at home I am in office   I discussed the limitations of evaluation and management by telemedicine and the availability of in person appointments. The patient expressed understanding and agreed to proceed.  History of Present Illness: Shannon Santiago is a 53 y.o. female who would like to discuss anxiety   Feeling better on the increased dose Cymbalta, she is off the Wellbutrin entirely at this point.  Improvement is encouraging to patient, but she thinks that there is still room to feel a bit better, she is having a lot of irritability/anger and anxiety issues.  She states that she has been on several SSRIs in the past, including Lexapro, Prozac, Zoloft which never seem to do much for her.  She did not tolerate Wellbutrin.   Depression screen [facility] 2/9 09/10/2019 08/12/2019 07/01/2019  Decreased Interest 1 3 1   Down, Depressed, Hopeless 2 2 1   PHQ - 2 Score 3 5 2   Altered sleeping 3 3 3   Tired, decreased energy 3 3 3   Change in appetite 0 2 0  Feeling bad or failure about yourself  1 2 1   Trouble concentrating 2 3 2   Moving slowly or fidgety/restless 1 2 0  Suicidal thoughts 0 1 0  PHQ-9 Score 13 21 11   Difficult doing work/chores Somewhat difficult Somewhat difficult -    She also had some concerns about recent elevated cholesterol levels as checked by her cardiologist, she is very reluctant to try statin medications due to concern for side effects.        Observations/Objective: BP 117/78 (Patient Position: Sitting, Cuff Size: Normal)   Pulse 86   Wt 159 lb (72.1 kg)   LMP 06/18/2017   BMI 30.04 kg/m  BP Readings from Last 3 Encounters:  09/10/19 117/78  08/18/19 (!) 136/93  08/12/19 (!) 151/100   Exam: Normal Speech.  NAD  Lab and  Radiology Results Results for orders placed or performed in visit on 07/01/19 (from the past 72 hour(s))  Lipid Profile     Status: Abnormal   Collection Time: 09/09/19 11:41 AM  Result Value Ref Range   Cholesterol, Total 265 (H) 100 - 199 mg/dL   Triglycerides 99 0 - 149 mg/dL   HDL 43 >39 mg/dL   VLDL Cholesterol Cal 17 5 - 40 mg/dL   LDL Chol Calc (NIH) 205 (H) 0 - 99 mg/dL   Chol/HDL Ratio 6.2 (H) 0.0 - 4.4 ratio    Comment:                                   T. Chol/HDL Ratio                                             Men  Women                               1/2 Avg.Risk  3.4    3.3  Avg.Risk  5.0    4.4                                2X Avg.Risk  9.6    7.1                                3X Avg.Risk 23.4   11.0    No results found.     Assessment and Plan: 53 y.o. female with The primary encounter diagnosis was Generalized anxiety disorder. Diagnoses of Moderate episode of recurrent major depressive disorder ([place]), Hypertension goal BP (blood pressure) < 130/80, and Familial hypercholesterolemia were also pertinent to this visit.   PDMP not reviewed this encounter. No orders of the defined types were placed in this encounter.  Meds ordered this encounter  Medications  . busPIRone (BUSPAR) 10 MG tablet    Sig: Take 0.5 tablets (5 mg total) by mouth 2 (two) times daily for 7 days, THEN 1 tablet (10 mg total) 2 (two) times daily.    Dispense:  60 tablet    Refill:  0   Patient Instructions  Plan:  For anxiety:  Let's continue on the Cymbalta 60 mg twice per day.  I sent a prescription for BuSpar/buspirone to act as an adjunctive therapy for anxiety.  Let us start at a fairly low dose of this medication and can bump up gradually.  Will start at 5 mg/half a tablet twice daily for about a week then can bump up to 10 mg/whole tablet twice daily from there.    You should get a MyChart message in a couple weeks reminding you to update me  with how you are doing on the medication, but if you are having any problems/side effects or other concerns sooner than that, please give [place] a call or send me a message!  For cholesterol, I would speak with your cardiologist about alternatives to statin, they may be able to give you some more information on some of the injectable medications     Instructions sent via [place]. If MyChart not available, pt was given option for info via personal e-mail w/ no guarantee of protected health info over unsecured e-mail communication, and MyChart sign-up instructions were sent to patient.   Follow Up Instructions: Return in about 2 weeks (around 09/24/2019) for MyChart reminder sent to update how you are doing on the BuSpar. .    I discussed the assessment and treatment plan with the patient. The patient was provided an opportunity to ask questions and all were answered. The patient agreed with the plan and demonstrated an understanding of the instructions.   The patient was advised to call back or seek an in-person evaluation if any new concerns, if symptoms worsen or if the condition fails to improve as anticipated.  25 minutes of non-face-to-face time was provided during this encounter.      . . . . . . . . . . . . . Marland Kitchen                   Historical information moved to improve visibility of documentation.  Past Medical History:  Diagnosis Date  . Allergy   . Anxiety   . Depression   . Family history of breast cancer   . Family history of colon cancer   . Family history of lung cancer   .  Family history of prostate cancer   . Hyperlipidemia   . Hypertension   . Past heart attack 06/28/2015  . Prolonged PTT (partial thromboplastin time) 06/23/2019   Normal PT  . Thyroid disease    Past Surgical History:  Procedure Laterality Date  . ENDOMETRIAL BIOPSY  06/21/2019  . EYE SURGERY     x 5   Social History   Tobacco Use  . Smoking status: Never Smoker  .  Smokeless tobacco: Never Used  Substance Use Topics  . Alcohol use: Yes    Comment: occ   family history includes Alcohol abuse in her mother; Anxiety disorder in her mother; Breast cancer in her cousin; Colon cancer in her maternal grandmother; Depression in her mother; Diabetes in her brother and father; Hypertension in her brother, father, and mother; Lung cancer (age of onset: 31) in her mother; Prostate cancer (age of onset: 22) in her father; Prostate cancer (age of onset: 64) in her paternal grandfather; Skin cancer in her father; Stroke in her mother; Thyroid disease in her maternal aunt and maternal uncle.  Medications: Current Outpatient Medications  Medication Sig Dispense Refill  . Albuterol Sulfate (PROAIR RESPICLICK) 123XX123 (90 Base) MCG/ACT AEPB Inhale 1-2 puffs into the lungs every 4 (four) hours as needed. 1 each 4  . amLODipine (NORVASC) 5 MG tablet Take 1 tablet (5 mg total) by mouth daily. 90 tablet 1  . Ascorbic Acid (VITAMIN C) 1000 MG tablet Take 1,000 mg by mouth daily.    Marland Kitchen b complex vitamins tablet Take 1 tablet by mouth daily.    Marland Kitchen CALCIUM PO Take by mouth.    . Cholecalciferol (VITAMIN D3) 10 MCG (400 UNIT) tablet Take 400 Units by mouth daily.    . DULoxetine (CYMBALTA) 60 MG capsule Take 1 capsule (60 mg total) by mouth daily for 10 days, THEN 2 capsules (120 mg total) daily. 90 capsule 0  . EPINEPHrine 0.3 mg/0.3 mL IJ SOAJ injection Inject 0.3 mLs (0.3 mg total) into the muscle once as needed (anaphylaxis/allergic reaction). 1 each 3  . EVENING PRIMROSE OIL PO Take by mouth.    . Ferrous Sulfate (IRON PO) Take by mouth.    . fexofenadine (ALLEGRA) 180 MG tablet Take by mouth.    . Ginger, Zingiber officinalis, (GINGER ROOT PO) Take by mouth.    . levothyroxine (SYNTHROID) 50 MCG tablet Take 1 tablet (50 mcg total) by mouth daily before breakfast. 90 tablet 3  . lisinopril (ZESTRIL) 20 MG tablet Take 1 tablet (20 mg total) by mouth daily. 90 tablet 1  . Magnesium  500 MG CAPS Take by mouth.    . misoprostol (CYTOTEC) 200 MCG tablet Take 1 tablet (200 mcg total) by mouth 4 (four) times daily. 2 tablet 0  . Multiple Vitamin (MULTIVITAMIN) capsule Take 1 capsule by mouth daily.    . Omega-3 Fatty Acids (FISH OIL) 1000 MG CAPS Take by mouth.    . Probiotic Product (PROBIOTIC ADVANCED PO) Take by mouth.    . Red Yeast Rice Extract (RED YEAST RICE PO) Take by mouth.    . busPIRone (BUSPAR) 10 MG tablet Take 0.5 tablets (5 mg total) by mouth 2 (two) times daily for 7 days, THEN 1 tablet (10 mg total) 2 (two) times daily. 60 tablet 0  . medroxyPROGESTERone (PROVERA) 10 MG tablet Take 1 tablet (10 mg total) by mouth daily. Use for ten days (Patient not taking: Reported on 08/12/2019) 10 tablet 0  . naproxen (NAPROSYN) 375  MG tablet Take 1 tablet (375 mg total) by mouth 2 (two) times daily with a meal. (Patient not taking: Reported on 09/10/2019) 30 tablet 0   No current facility-administered medications for this visit.    Allergies  Allergen Reactions  . Morphine And Related Other (See Comments)    Numb hands and feet  . Peanut Oil Other (See Comments)    Stomach cramping, hand/foot swelling  . Penicillins Hives  . Polymyxin B-Trimethoprim Other (See Comments), Photosensitivity and Swelling  . Citalopram Other (See Comments)    SSRI's ineffective - Citalopram, Fluoxetine, Sertraline, Excitalopram   . Food Nausea Only, Swelling, Palpitations, Other (See Comments) and Cough    WHEAT  . Singulair [Montelukast Sodium] Other (See Comments)    Mood changes, worsening depression

## 2019-09-15 ENCOUNTER — Encounter: Payer: Self-pay | Admitting: Licensed Clinical Social Worker

## 2019-09-15 ENCOUNTER — Ambulatory Visit: Payer: Self-pay | Admitting: Licensed Clinical Social Worker

## 2019-09-15 ENCOUNTER — Telehealth: Payer: Self-pay | Admitting: Licensed Clinical Social Worker

## 2019-09-15 DIAGNOSIS — Z1379 Encounter for other screening for genetic and chromosomal anomalies: Secondary | ICD-10-CM

## 2019-09-15 DIAGNOSIS — Z801 Family history of malignant neoplasm of trachea, bronchus and lung: Secondary | ICD-10-CM

## 2019-09-15 DIAGNOSIS — Z800 Family history of malignant neoplasm of digestive organs: Secondary | ICD-10-CM

## 2019-09-15 DIAGNOSIS — Z8042 Family history of malignant neoplasm of prostate: Secondary | ICD-10-CM

## 2019-09-15 DIAGNOSIS — Z803 Family history of malignant neoplasm of breast: Secondary | ICD-10-CM

## 2019-09-15 NOTE — Progress Notes (Signed)
HPI:  Shannon Santiago was previously seen in the [employer] [facility] clinic due to a family history of cancer and concerns regarding a hereditary predisposition to cancer. Please refer to our prior cancer genetics clinic note for more information regarding our discussion, assessment and recommendations, at the time. Shannon Santiago recent genetic test results were disclosed to her, as were recommendations warranted by these results. These results and recommendations are discussed in more detail below.  CANCER HISTORY:  Oncology History   No history exists.    FAMILY HISTORY:  We obtained a detailed, 4-generation family history.  Significant diagnoses are listed below: Family History  Problem Relation Age of Onset  . Hypertension Mother   . Stroke Mother   . Lung cancer Mother 73  . Anxiety disorder Mother   . Depression Mother   . Alcohol abuse Mother   . Skin cancer Father   . Prostate cancer Father 56  . Hypertension Father   . Diabetes Father   . Thyroid disease Maternal Aunt   . Thyroid disease Maternal Uncle   . Diabetes Brother   . Hypertension Brother   . Colon cancer Maternal Grandmother        dx 43s, d. 106s  . Prostate cancer Paternal Grandfather 32  . Breast cancer Cousin        dx 62s-30s   Shannon Santiago does not have children. She has one full brother, 16, who has not had cancer. She has a maternal half brother who has also not had cancer.  Shannon Santiago mother was diagnosed with lung cancer at 45 and died shortly after diagnosis. Patient had 3 maternal aunts and 1 maternal uncle. One of her aunts, who is living at 80, has a daughter who was diagnosed with breast cancer in her 17s-30s and is living at 50. Another maternal cousin had cancer in their 32s but patient is unsure the cancer type. Patient's maternal grandmother was diagnosed with colon cancer in her 56s and died in her 1s. Maternal grandfather died due to heart issues. Patient also reports that many members of this  side of the family had mental illness.   Shannon Santiago father is living at 32. He had prostate cancer at 35 and has had several squamous cell carcinoma skin cancers. Patient had 2 paternal uncles, no cancers. Paternal grandfather had prostate cancer in his 56s. Paternal grandmother died due to heart issues.   Shannon Santiago is unaware of previous family history of genetic testing for hereditary cancer risks. Patient's ancestors are of English descent. There is no reported Ashkenazi Jewish ancestry. There is no known consanguinity.  GENETIC TEST RESULTS: Genetic testing reported out on 09/14/2019 through the [facility] CancerNext-Expanded+RNAinsight cancer panel found no pathogenic mutations.  The CancerNext-Expanded + RNAinsight gene panel offered by [employer] and includes sequencing and rearrangement analysis for the following 77 genes: IP, ALK, APC*, ATM*, AXIN2, BAP1, BARD1, BLM, BMPR1A, BRCA1*, BRCA2*, BRIP1*, CDC73, CDH1*,CDK4, CDKN1B, CDKN2A, CHEK2*, CTNNA1, DICER1, FANCC, FH, FLCN, GALNT12, KIF1B, LZTR1, MAX, MEN1, MET, MLH1*, MSH2*, MSH3, MSH6*, MUTYH*, NBN, NF1*, NF2, NTHL1, PALB2*, PHOX2B, PMS2*, POT1, PRKAR1A, PTCH1, PTEN*, RAD51C*, RAD51D*,RB1, RECQL, RET, SDHA, SDHAF2, SDHB, SDHC, SDHD, SMAD4, SMARCA4, SMARCB1, SMARCE1, STK11, SUFU, TMEM127, TP53*,TSC1, TSC2, VHL and XRCC2 (sequencing and deletion/duplication); EGFR, EGLN1, HOXB13, KIT, MITF, PDGFRA, POLD1 and POLE (sequencing only); EPCAM and GREM1 (deletion/duplication only). DNA and RNA analyses performed for * genes.  The test report has been scanned into EPIC and is located under the Molecular Pathology section  of the Results Review tab.  A portion of the result report is included below for reference.     We discussed with Shannon Santiago that because current genetic testing is not perfect, it is possible there may be a gene mutation in one of these genes that current testing cannot detect, but that chance is small.  We also discussed, that  there could be another gene that has not yet been discovered, or that we have not yet tested, that is responsible for the cancer diagnoses in the family. It is also possible there is a hereditary cause for the cancer in the family that Shannon Santiago did not inherit and therefore was not identified in her testing.  Therefore, it is important to remain in touch with cancer genetics in the future so that we can continue to offer Shannon Santiago the most up to date genetic testing.   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  At this time, it is unknown if this variant is associated with increased cancer risk or if this is a normal finding, but most variants such as this get reclassified to being inconsequential. It should not be used to make medical management decisions. With time, we suspect the lab will determine the significance of this variant, if any. If we do learn more about it, we will try to contact Shannon Santiago to discuss it further. However, it is important to stay in touch with [place] periodically and keep the address and phone number up to date.  ADDITIONAL GENETIC TESTING: We discussed with Shannon Santiago that her genetic testing was fairly extensive.  If there are genes identified to increase cancer risk that can be analyzed in the future, we would be happy to discuss and coordinate this testing at that time.    CANCER SCREENING RECOMMENDATIONS: Shannon Santiago test result is considered negative (normal).  This means that we have not identified a hereditary cause for her family history of cancer at this time.   While reassuring, this does not definitively rule out a hereditary predisposition to cancer. It is still possible that there could be genetic mutations that are undetectable by current technology. There could be genetic mutations in genes that have not been tested or identified to increase cancer risk.  Therefore, it is recommended she continue to follow  the cancer management and screening guidelines provided by her  primary healthcare provider.   An individual's cancer risk and medical management are not determined by genetic test results alone. Overall cancer risk assessment incorporates additional factors, including personal medical history, family history, and any available genetic information that may result in a personalized plan for cancer prevention and surveillance.  RECOMMENDATIONS FOR FAMILY MEMBERS:  Relatives in this family might be at some increased risk of developing cancer, over the general population risk, simply due to the family history of cancer.  We recommended female relatives in this family have a yearly mammogram beginning at age 37, or 24 years younger than the earliest onset of cancer, an annual clinical breast exam, and perform monthly breast self-exams. Female relatives in this family should also have a gynecological exam as recommended by their primary provider. All family members should have a colonoscopy by age 46, or as directed by their physicians.   It is also possible there is a hereditary cause for the cancer in Shannon Santiago's family that she did not inherit and therefore was not identified in her.  Based  on Shannon Santiago family history, we recommended her brothers/maternal relatives, especially her cousin who had breast cancer at an early age, have genetic counseling and testing. Shannon Santiago will let us know if we can be of any assistance in coordinating genetic counseling and/or testing for these family members.  FOLLOW-UP: Lastly, we discussed with Shannon Santiago that cancer genetics is a rapidly advancing field and it is possible that new genetic tests will be appropriate for her and/or her family members in the future. We encouraged her to remain in contact with cancer genetics on an annual basis so we can update her personal and family histories and let her know of advances in cancer genetics that may benefit this family.    Our contact number was provided. Shannon Santiago. Reisz questions were answered to her satisfaction, and she knows she is welcome to call us at anytime with additional questions or concerns.   Faith Rogue, Shannon Santiago, [facility] Genetic Counselor [email or web address] Phone: [phone]

## 2019-09-15 NOTE — Telephone Encounter (Signed)
Revealed negative genetic testing.  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 This normal result is reassuring.  It is unlikely that there is an increased risk of cancer due to a mutation in one of these genes.  However, genetic testing is not perfect, and cannot definitively rule out a hereditary cause.  It will be important for her to keep in contact with genetics to learn if any additional testing may be needed in the future.

## 2019-09-23 MED FILL — LISINOPRIL 20 MG TABLET: 20 | 90 days supply | Qty: 90 | Fill #1

## 2019-09-23 MED FILL — AMLODIPINE BESYLATE 5 MG TA: 5 | 90 days supply | Qty: 90 | Fill #1

## 2019-09-27 ENCOUNTER — Encounter: Payer: Self-pay | Admitting: Osteopathic Medicine

## 2019-09-27 ENCOUNTER — Telehealth: Payer: Self-pay | Admitting: Cardiology

## 2019-09-27 ENCOUNTER — Other Ambulatory Visit: Payer: Self-pay | Admitting: Osteopathic Medicine

## 2019-09-27 MED ORDER — BUSPIRONE HCL 15 MG PO TABS: 15.0000 mg | ORAL_TABLET | Freq: Two times a day (BID) | ORAL | 0 refills | Status: DC

## 2019-09-27 MED FILL — busPIRone HCL 15 MG TABS: 15 | 30 days supply | Qty: 60 | Fill #0

## 2019-09-27 NOTE — Telephone Encounter (Signed)
Patient would like to discuss an issue she is having with her meds  Please call to discuss

## 2019-09-27 NOTE — Telephone Encounter (Signed)
Telephone call to patient. She was wondering if there is an alternative to statins as she does not want to take them due to depression side effects. Spoke with Dr Harriet Masson who says she can diet better and continue her red yeast rice. Pt in agreement and had no further questions

## 2019-09-28 MED ORDER — DULOXETINE HCL 60 MG PO CPEP: 120.0000 mg | ORAL_CAPSULE | Freq: Every day | ORAL | 1 refills | Status: DC

## 2019-09-28 MED FILL — DULOXETINE HCL 60 MG CPEP: 60 | 90 days supply | Qty: 180 | Fill #0

## 2019-09-28 NOTE — Telephone Encounter (Signed)
Pt has been updated and aware of new directions on duloxetine rx. No other inquiries during call.

## 2019-09-28 NOTE — Telephone Encounter (Signed)
Pt returned call back with clarification on Duloxetine rx. As per pt, she is now taking 2 tabs daily. Pls send an updated rx to [facility]. Thanks.

## 2019-09-28 NOTE — Telephone Encounter (Signed)
Attempted to contact patient regarding med refill request for duloxetine. Pt is aware to advise whether she is taking 1 or 2 tabs daily. Refill pending until call back.

## 2019-09-29 MED ORDER — NIACIN ER (ANTIHYPERLIPIDEMIC) 500 MG PO TBCR: EXTENDED_RELEASE_TABLET | ORAL | 0 refills | Status: DC

## 2019-09-29 MED FILL — NIACIN ER (ANTIHYPERLIPIDEM: 500 | 42 days supply | Qty: 84 | Fill #0

## 2019-09-29 NOTE — Addendum Note (Signed)
Addended by: Maryla Morrow on: 09/29/2019 05:04 PM   Modules accepted: Orders

## 2019-10-05 MED FILL — LEVOTHYROXINE 50 MCG TABLET: 50 | 90 days supply | Qty: 90 | Fill #1

## 2019-10-19 ENCOUNTER — Encounter: Payer: Self-pay | Admitting: Osteopathic Medicine

## 2019-10-20 ENCOUNTER — Encounter (HOSPITAL_BASED_OUTPATIENT_CLINIC_OR_DEPARTMENT_OTHER): Payer: Self-pay | Admitting: Obstetrics & Gynecology

## 2019-10-20 ENCOUNTER — Other Ambulatory Visit: Payer: Self-pay

## 2019-10-25 ENCOUNTER — Other Ambulatory Visit (HOSPITAL_COMMUNITY)
Admission: RE | Admit: 2019-10-25 | Discharge: 2019-10-25 | Disposition: A | Discharge status: Home / Self Care | Payer: No Typology Code available for payment source | Source: Ambulatory Visit | Attending: Obstetrics & Gynecology | Admitting: Obstetrics & Gynecology

## 2019-10-25 DIAGNOSIS — Z01812 Encounter for preprocedural laboratory examination: Secondary | ICD-10-CM | POA: Diagnosis not present

## 2019-10-25 DIAGNOSIS — Z20822 Contact with and (suspected) exposure to covid-19: Secondary | ICD-10-CM | POA: Diagnosis not present

## 2019-10-26 ENCOUNTER — Encounter: Payer: Self-pay | Admitting: Osteopathic Medicine

## 2019-10-27 ENCOUNTER — Ambulatory Visit (HOSPITAL_BASED_OUTPATIENT_CLINIC_OR_DEPARTMENT_OTHER): Payer: No Typology Code available for payment source | Admitting: Certified Registered Nurse Anesthetist

## 2019-10-27 ENCOUNTER — Encounter (HOSPITAL_BASED_OUTPATIENT_CLINIC_OR_DEPARTMENT_OTHER): Payer: Self-pay | Admitting: Obstetrics & Gynecology

## 2019-10-27 ENCOUNTER — Ambulatory Visit (HOSPITAL_BASED_OUTPATIENT_CLINIC_OR_DEPARTMENT_OTHER)
Admission: RE | Admit: 2019-10-27 | Discharge: 2019-10-27 | Disposition: A | Discharge status: Home / Self Care | Payer: No Typology Code available for payment source | Attending: Obstetrics & Gynecology | Admitting: Obstetrics & Gynecology

## 2019-10-27 ENCOUNTER — Other Ambulatory Visit: Payer: Self-pay

## 2019-10-27 ENCOUNTER — Encounter (HOSPITAL_BASED_OUTPATIENT_CLINIC_OR_DEPARTMENT_OTHER)
Admission: RE | Disposition: A | Discharge status: Home / Self Care | Payer: Self-pay | Source: Home / Self Care | Attending: Obstetrics & Gynecology

## 2019-10-27 DIAGNOSIS — Z6831 Body mass index (BMI) 31.0-31.9, adult: Secondary | ICD-10-CM | POA: Insufficient documentation

## 2019-10-27 DIAGNOSIS — E669 Obesity, unspecified: Secondary | ICD-10-CM | POA: Diagnosis not present

## 2019-10-27 DIAGNOSIS — Z79890 Hormone replacement therapy (postmenopausal): Secondary | ICD-10-CM | POA: Insufficient documentation

## 2019-10-27 DIAGNOSIS — E039 Hypothyroidism, unspecified: Secondary | ICD-10-CM | POA: Insufficient documentation

## 2019-10-27 DIAGNOSIS — I10 Essential (primary) hypertension: Secondary | ICD-10-CM | POA: Diagnosis not present

## 2019-10-27 DIAGNOSIS — F329 Major depressive disorder, single episode, unspecified: Secondary | ICD-10-CM | POA: Diagnosis not present

## 2019-10-27 DIAGNOSIS — N840 Polyp of corpus uteri: Secondary | ICD-10-CM | POA: Insufficient documentation

## 2019-10-27 DIAGNOSIS — F419 Anxiety disorder, unspecified: Secondary | ICD-10-CM | POA: Insufficient documentation

## 2019-10-27 DIAGNOSIS — J45909 Unspecified asthma, uncomplicated: Secondary | ICD-10-CM | POA: Insufficient documentation

## 2019-10-27 DIAGNOSIS — Z79899 Other long term (current) drug therapy: Secondary | ICD-10-CM | POA: Insufficient documentation

## 2019-10-27 HISTORY — DX: Hypothyroidism, unspecified: E03.9

## 2019-10-27 HISTORY — DX: Unspecified asthma, uncomplicated: J45.909

## 2019-10-27 HISTORY — PX: HYSTEROSCOPY WITH D & C: SHX1775

## 2019-10-27 LAB — POCT PREGNANCY, URINE: Preg Test, Ur: NEGATIVE

## 2019-10-27 LAB — SARS CORONAVIRUS 2 (TAT 6-24 HRS): SARS Coronavirus 2: NEGATIVE

## 2019-10-27 SURGERY — DILATATION AND CURETTAGE /HYSTEROSCOPY
Anesthesia: General | Site: Vagina

## 2019-10-27 MED ORDER — BUSPIRONE HCL 15 MG PO TABS: 15.0000 mg | ORAL_TABLET | Freq: Two times a day (BID) | ORAL | 0 refills | Status: DC

## 2019-10-27 MED ORDER — SODIUM CHLORIDE 0.9 % IR SOLN
Status: DC | PRN
  Administered 2019-10-27: 3000 mL

## 2019-10-27 MED ORDER — PROPOFOL 10 MG/ML IV BOLUS
INTRAVENOUS | Status: DC | PRN
  Administered 2019-10-27: 170 mg via INTRAVENOUS

## 2019-10-27 MED ORDER — LACTATED RINGERS IV SOLN: INTRAVENOUS | Status: DC

## 2019-10-27 MED ORDER — DEXAMETHASONE SODIUM PHOSPHATE 10 MG/ML IJ SOLN
INTRAMUSCULAR | Status: DC | PRN
  Administered 2019-10-27: 10 mg via INTRAVENOUS

## 2019-10-27 MED ORDER — BUPIVACAINE HCL (PF) 0.5 % IJ SOLN
INTRAMUSCULAR | Status: DC | PRN
  Administered 2019-10-27: 24 mL

## 2019-10-27 MED ORDER — FENTANYL CITRATE (PF) 100 MCG/2ML IJ SOLN
INTRAMUSCULAR | Status: AC
  Filled 2019-10-27: qty 2

## 2019-10-27 MED ORDER — ONDANSETRON HCL 4 MG/2ML IJ SOLN
INTRAMUSCULAR | Status: DC | PRN
  Administered 2019-10-27: 4 mg via INTRAVENOUS

## 2019-10-27 MED ORDER — SCOPOLAMINE 1 MG/3DAYS TD PT72
MEDICATED_PATCH | TRANSDERMAL | Status: AC
  Filled 2019-10-27: qty 1

## 2019-10-27 MED ORDER — LIDOCAINE 2% (20 MG/ML) 5 ML SYRINGE
INTRAMUSCULAR | Status: AC
  Filled 2019-10-27: qty 5

## 2019-10-27 MED ORDER — PROPOFOL 500 MG/50ML IV EMUL
INTRAVENOUS | Status: AC
  Filled 2019-10-27: qty 50

## 2019-10-27 MED ORDER — KETOROLAC TROMETHAMINE 30 MG/ML IJ SOLN
INTRAMUSCULAR | Status: AC
  Filled 2019-10-27: qty 1

## 2019-10-27 MED ORDER — MIDAZOLAM HCL 5 MG/5ML IJ SOLN
INTRAMUSCULAR | Status: DC | PRN
  Administered 2019-10-27: 2 mg via INTRAVENOUS

## 2019-10-27 MED ORDER — KETOROLAC TROMETHAMINE 30 MG/ML IJ SOLN
INTRAMUSCULAR | Status: DC | PRN
  Administered 2019-10-27: 30 mg via INTRAVENOUS

## 2019-10-27 MED ORDER — FENTANYL CITRATE (PF) 100 MCG/2ML IJ SOLN
INTRAMUSCULAR | Status: DC | PRN
  Administered 2019-10-27 (×4): 25 ug via INTRAVENOUS

## 2019-10-27 MED ORDER — BUPIVACAINE HCL (PF) 0.5 % IJ SOLN
INTRAMUSCULAR | Status: AC
  Filled 2019-10-27: qty 30

## 2019-10-27 MED ORDER — DEXAMETHASONE SODIUM PHOSPHATE 10 MG/ML IJ SOLN
INTRAMUSCULAR | Status: AC
  Filled 2019-10-27: qty 1

## 2019-10-27 MED ORDER — MIDAZOLAM HCL 2 MG/2ML IJ SOLN
INTRAMUSCULAR | Status: AC
  Filled 2019-10-27: qty 2

## 2019-10-27 MED ORDER — SCOPOLAMINE 1 MG/3DAYS TD PT72
MEDICATED_PATCH | TRANSDERMAL | Status: DC | PRN
  Administered 2019-10-27: 1 via TRANSDERMAL

## 2019-10-27 MED ORDER — LIDOCAINE 2% (20 MG/ML) 5 ML SYRINGE
INTRAMUSCULAR | Status: DC | PRN
  Administered 2019-10-27: 80 mg via INTRAVENOUS

## 2019-10-27 MED ORDER — ONDANSETRON HCL 4 MG/2ML IJ SOLN
INTRAMUSCULAR | Status: AC
  Filled 2019-10-27: qty 2

## 2019-10-27 MED ORDER — IBUPROFEN 600 MG PO TABS: 600.0000 mg | ORAL_TABLET | Freq: Four times a day (QID) | ORAL | 1 refills | Status: DC | PRN

## 2019-10-27 MED FILL — IBUPROFEN 600 MG TABLET: 600 | 30 days supply | Qty: 30 | Fill #0

## 2019-10-27 MED FILL — busPIRone HCL 15 MG TABS: 15 | 30 days supply | Qty: 60 | Fill #0

## 2019-10-27 SURGICAL SUPPLY — 21 items
CANISTER SUCT 3000ML PPV (MISCELLANEOUS) ×3 IMPLANT
CATH ROBINSON RED A/P 16FR (CATHETERS) IMPLANT
DEVICE MYOSURE LITE (MISCELLANEOUS) IMPLANT
DEVICE MYOSURE REACH (MISCELLANEOUS) IMPLANT
GAUZE 4X4 16PLY RFD (DISPOSABLE) ×3 IMPLANT
GLOVE BIO SURGEON STRL SZ 6.5 (GLOVE) ×3 IMPLANT
GLOVE BIOGEL PI IND STRL 7.0 (GLOVE) ×2 IMPLANT
GLOVE BIOGEL PI INDICATOR 7.0 (GLOVE) ×1
GOWN STRL REUS W/ TWL XL LVL3 (GOWN DISPOSABLE) ×2 IMPLANT
GOWN STRL REUS W/TWL LRG LVL3 (GOWN DISPOSABLE) ×3 IMPLANT
GOWN STRL REUS W/TWL XL LVL3 (GOWN DISPOSABLE) ×1
KIT PROCEDURE FLUENT (KITS) ×3 IMPLANT
NDL SPNL 18GX3.5 QUINCKE PK (NEEDLE) ×2 IMPLANT
NEEDLE SPNL 18GX3.5 QUINCKE PK (NEEDLE) ×6 IMPLANT
PACK VAGINAL MINOR WOMEN LF (CUSTOM PROCEDURE TRAY) ×3 IMPLANT
PAD OB MATERNITY 4.3X12.25 (PERSONAL CARE ITEMS) ×3 IMPLANT
PAD PREP 24X48 CUFFED NSTRL (MISCELLANEOUS) ×3 IMPLANT
SEAL ROD LENS SCOPE MYOSURE (ABLATOR) ×3 IMPLANT
SLEEVE SCD COMPRESS KNEE MED (MISCELLANEOUS) ×6 IMPLANT
SYR 30ML LL (SYRINGE) ×3 IMPLANT
TOWEL GREEN STERILE FF (TOWEL DISPOSABLE) ×6 IMPLANT

## 2019-10-27 NOTE — Discharge Instructions (Signed)
Dilation and Curettage or Vacuum Curettage, Care After This sheet gives you information about how to care for yourself after your procedure. Your health care provider may also give you more specific instructions. If you have problems or questions, contact your health care provider. What can I expect after the procedure? After your procedure, it is common to have:  Mild pain or cramping.  Some vaginal bleeding or spotting. These may last for up to 2 weeks after your procedure. Follow these instructions at home: Activity   Do not drive or use heavy machinery while taking prescription pain medicine.  Avoid driving for the first 24 hours after your procedure.  Take frequent, short walks, followed by rest periods, throughout the day. Ask your health care provider what activities are safe for you. After 1-2 days, you may be able to return to your normal activities.  Do not lift anything heavier than 10 lb (4.5 kg) until your health care provider approves.  For at least 2 weeks, or as long as told by your health care provider, do not: ? Douche. ? Use tampons. ? Have sexual intercourse. General instructions   Take over-the-counter and prescription medicines only as told by your health care provider. This is especially important if you take blood thinning medicine.  Do not take baths, swim, or use a hot tub until your health care provider approves. Take showers instead of baths.  Wear compression stockings as told by your health care provider. These stockings help to prevent blood clots and reduce swelling in your legs.  It is your responsibility to get the results of your procedure. Ask your health care provider, or the department performing the procedure, when your results will be ready.  Keep all follow-up visits as told by your health care provider. This is important. Contact a health care provider if:  You have severe cramps that get worse or that do not get better with  medicine.  You have severe abdominal pain.  You cannot drink fluids without vomiting.  You develop pain in a different area of your pelvis.  You have bad-smelling vaginal discharge.  You have a rash. Get help right away if:  You have vaginal bleeding that soaks more than one sanitary pad in 1 hour, for 2 hours in a row.  You pass large blood clots from your vagina.  You have a fever that is above 100.51F (38.0C).  Your abdomen feels very tender or hard.  You have chest pain.  You have shortness of breath.  You cough up blood.  You feel dizzy or light-headed.  You faint.  You have pain in your neck or shoulder area. This information is not intended to replace advice given to you by your health care provider. Make sure you discuss any questions you have with your health care provider.   Post Anesthesia Home Care Instructions  Activity: Get plenty of rest for the remainder of the day. A responsible individual must stay with you for 24 hours following the procedure.  For the next 24 hours, DO NOT: -Drive a car -Paediatric nurse -Drink alcoholic beverages -Take any medication unless instructed by your physician -Make any legal decisions or sign important papers.  Meals: Start with liquid foods such as gelatin or soup. Progress to regular foods as tolerated. Avoid greasy, spicy, heavy foods. If nausea and/or vomiting occur, drink only clear liquids until the nausea and/or vomiting subsides. Call your physician if vomiting continues.  Special Instructions/Symptoms: Your throat may feel dry or  sore from the anesthesia or the breathing tube placed in your throat during surgery. If this causes discomfort, gargle with warm salt water. The discomfort should disappear within 24 hours.  If you had a scopolamine patch placed behind your ear for the management of post- operative nausea and/or vomiting:  1. The medication in the patch is effective for 72 hours, after which it  should be removed.  Wrap patch in a tissue and discard in the trash. Wash hands thoroughly with soap and water. 2. You may remove the patch earlier than 72 hours if you experience unpleasant side effects which may include dry mouth, dizziness or visual disturbances. 3. Avoid touching the patch. Wash your hands with soap and water after contact with the patch.    DISCHARGE INSTRUCTIONS: D&C / D&E The following instructions have been prepared to help you care for yourself upon your return home.   Personal hygiene: Marland Kitchen Use sanitary pads for vaginal drainage, not tampons. . Shower the day after your procedure. . NO tub baths, pools or Jacuzzis for 2-3 weeks. . Wipe front to back after using the bathroom.  Activity and limitations: . Do NOT drive or operate any equipment for 24 hours. The effects of anesthesia are still present and drowsiness may result. . Do NOT rest in bed all day. . Walking is encouraged. . Walk up and down stairs slowly. . You may resume your normal activity in one to two days or as indicated by your physician.  Sexual activity: NO intercourse for at least 2 weeks after the procedure, or as indicated by your physician.  Diet: Eat a light meal as desired this evening. You may resume your usual diet tomorrow.  Return to work: You may resume your work activities in one to two days or as indicated by your doctor.  What to expect after your surgery: Expect to have vaginal bleeding/discharge for 2-3 days and spotting for up to 10 days. It is not unusual to have soreness for up to 1-2 weeks. You may have a slight burning sensation when you urinate for the first day. Mild cramps may continue for a couple of days. You may have a regular period in 2-6 weeks.  Call your doctor for any of the following: . Excessive vaginal bleeding, saturating and changing one pad every hour. . Inability to urinate 6 hours after discharge from hospital. . Pain not relieved by pain medication. .  Fever of 100.4 F or greater. . Unusual vaginal discharge or odor  No ibuprofen until after 9:30pm tonight.     Document Revised: 09/19/2017 Document Reviewed: 05/09/2016 Elsevier Patient Education  2020 [employer].

## 2019-10-27 NOTE — Anesthesia Postprocedure Evaluation (Signed)
Anesthesia Post Note  Patient: Shannon Santiago  Procedure(s) Performed: DILATATION & CURETTAGE/HYSTEROSCOPY (N/A Uterus)     Patient location during evaluation: PACU Anesthesia Type: General Level of consciousness: awake and alert and oriented Pain management: pain level controlled Vital Signs Assessment: post-procedure vital signs reviewed and stable Respiratory status: spontaneous breathing, nonlabored ventilation and respiratory function stable Cardiovascular status: blood pressure returned to baseline and stable Postop Assessment: no apparent nausea or vomiting Anesthetic complications: no    Last Vitals:  Vitals:   10/27/19 1345 10/27/19 1400  BP: 120/88 126/87  Pulse: 79 84  Resp: 13 18  Temp:    SpO2: 100% 100%    Last Pain:  Vitals:   10/27/19 1400  TempSrc:   PainSc: 0-No pain                 Jaedyn Lard A.

## 2019-10-27 NOTE — H&P (Signed)
Shannon Santiago is an 54 y.o. single P0 here today to have a d&c and myosure removal of a uterine polyp for treatment of PMB. She went through menopause in 2018 and then had an occasion of PMB on 8/20. She saw Dr. Gala Romney and had an [facility] that was benign with endometrial polyp. She had also had a gyn u/s that showed a 14 mm endometrium. She took 10 days of provera and has not seen any bleeding since.  She had a sonohysterogram with Dr. Kerin Perna and it showed a 0.6 cm polypoid filling defect.  Menstrual History: Menarche age: 77 Patient's last menstrual period was 06/18/2017.    Past Medical History:  Diagnosis Date  . Allergy   . Anxiety   . Asthma   . Depression   . Family history of breast cancer   . Family history of colon cancer   . Family history of lung cancer   . Family history of prostate cancer   . Hyperlipidemia   . Hypertension   . Hypothyroidism   . Prolonged PTT (partial thromboplastin time) 06/23/2019   Normal PT  . Thyroid disease     Past Surgical History:  Procedure Laterality Date  . ENDOMETRIAL BIOPSY  06/21/2019  . EYE SURGERY     x 5    Family History  Problem Relation Age of Onset  . Hypertension Mother   . Stroke Mother   . Lung cancer Mother 67  . Anxiety disorder Mother   . Depression Mother   . Alcohol abuse Mother   . Skin cancer Father   . Prostate cancer Father 71  . Hypertension Father   . Diabetes Father   . Thyroid disease Maternal Aunt   . Thyroid disease Maternal Uncle   . Diabetes Brother   . Hypertension Brother   . Colon cancer Maternal Grandmother        dx 45s, d. 64s  . Prostate cancer Paternal Grandfather 30  . Breast cancer Cousin        dx 20s-30s    Social History:  reports that she has never smoked. She has never used smokeless tobacco. She reports current alcohol use. She reports that she does not use drugs.  Allergies:  Allergies  Allergen Reactions  . Morphine And Related Other (See Comments)    Numb hands  and feet  . Peanut Oil Other (See Comments)    Stomach cramping, hand/foot swelling  . Penicillins Hives  . Polymyxin B-Trimethoprim Other (See Comments), Photosensitivity and Swelling  . Citalopram Other (See Comments)    SSRI's ineffective - Citalopram, Fluoxetine, Sertraline, Excitalopram   . Food Nausea Only, Swelling, Palpitations, Other (See Comments) and Cough    WHEAT  . Singulair [Montelukast Sodium] Other (See Comments)    Mood changes, worsening depression    Medications Prior to Admission  Medication Sig Dispense Refill Last Dose  . Albuterol Sulfate (PROAIR RESPICLICK) [id number] (90 Base) MCG/ACT AEPB Inhale 1-2 puffs into the lungs every 4 (four) hours as needed. (Patient taking differently: Inhale 1-2 puffs into the lungs every 4 (four) hours as needed. ) 1 each 4 Past Week at Unknown time  . amLODipine (NORVASC) 5 MG tablet Take 1 tablet (5 mg total) by mouth daily. (Patient taking differently: Take 5 mg by mouth daily. ) 90 tablet 1 10/27/2019 at 1000  . Ascorbic Acid (VITAMIN C) 1000 MG tablet Take 1,000 mg by mouth daily.   Past Week at Unknown time  . b complex vitamins  tablet Take 1 tablet by mouth daily.   Past Week at Unknown time  . busPIRone (BUSPAR) 15 MG tablet Take 1 tablet (15 mg total) by mouth 2 (two) times daily. 60 tablet 0 10/26/2019 at Unknown time  . CALCIUM PO Take by mouth.   Past Week at Unknown time  . Cholecalciferol (VITAMIN D3) 10 MCG (400 UNIT) tablet Take 400 Units by mouth daily.   Past Week at Unknown time  . DULoxetine (CYMBALTA) 60 MG capsule Take 2 capsules (120 mg total) by mouth daily. 180 capsule 1 10/26/2019 at Unknown time  . EVENING PRIMROSE OIL PO Take by mouth.   Past Week at Unknown time  . Ferrous Sulfate (IRON PO) Take by mouth.   Past Week at Unknown time  . fexofenadine (ALLEGRA) 180 MG tablet Take by mouth.   10/26/2019 at Unknown time  . Ginger, Zingiber officinalis, (GINGER ROOT PO) Take by mouth.   Past Week at Unknown time  .  levothyroxine (SYNTHROID) 50 MCG tablet Take 1 tablet (50 mcg total) by mouth daily before breakfast. (Patient taking differently: Take 50 mcg by mouth daily before breakfast. ) 90 tablet 3 10/27/2019 at 0900  . lisinopril (ZESTRIL) 20 MG tablet Take 1 tablet (20 mg total) by mouth daily. (Patient taking differently: Take 20 mg by mouth daily. ) 90 tablet 1 10/26/2019 at Unknown time  . Magnesium 500 MG CAPS Take by mouth.   Past Week at Unknown time  . misoprostol (CYTOTEC) 200 MCG tablet Take 1 tablet (200 mcg total) by mouth 4 (four) times daily. 2 tablet 0 10/26/2019 at Unknown time  . Multiple Vitamin (MULTIVITAMIN) capsule Take 1 capsule by mouth daily.   Past Week at Unknown time  . niacin (NIASPAN) 500 MG CR tablet Take 1 tablet p.o. nightly for 14 days, then 2 tablets p.o. nightly for 14 days, then 3 tablets p.o. nightly for 14 days 84 tablet 0 10/26/2019 at Unknown time  . Omega-3 Fatty Acids (FISH OIL) 1000 MG CAPS Take by mouth.   Past Week at Unknown time  . Probiotic Product (PROBIOTIC ADVANCED PO) Take by mouth.   Past Week at Unknown time  . EPINEPHrine 0.3 mg/0.3 mL IJ SOAJ injection Inject 0.3 mLs (0.3 mg total) into the muscle once as needed (anaphylaxis/allergic reaction). (Patient taking differently: Inject 0.3 mg into the muscle once as needed (anaphylaxis/allergic reaction). ) 1 each 3 Unknown at Unknown time  . naproxen (NAPROSYN) 375 MG tablet Take 1 tablet (375 mg total) by mouth 2 (two) times daily with a meal. 30 tablet 0 More than a month at Unknown time    Review of Systems She works for [employer].  Blood pressure 134/83, pulse 97, temperature 97.9 F (36.6 C), temperature source Oral, resp. rate 20, height 5\' 1"  (1.549 m), weight 74.3 kg, last menstrual period 06/18/2017, SpO2 99 %. Physical Exam  Breathing, conversing, and ambulating normally Well nourished, well hydrated White female, no apparent distress Heart- rrr Lung- CTAB Abd- benign  Results for orders placed or  performed during the hospital encounter of 10/27/19 (from the past 24 hour(s))  Pregnancy, urine POC     Status: None   Collection Time: 10/27/19 11:52 AM  Result Value Ref Range   Preg Test, Ur NEGATIVE NEGATIVE    No results found.  Assessment/Plan: PMB and uterine polyp- plan for d&c and myosure resection of polpy.  She understands the risks of surgery, including, but not to infection, bleeding, DVTs, damage to bowel,  bladder, ureters. She wishes to proceed.     Emily Filbert 10/27/2019, 12:27 PM

## 2019-10-27 NOTE — Op Note (Signed)
10/27/2019  1:38 PM  PATIENT:  Shannon Santiago  54 y.o. female  PRE-OPERATIVE DIAGNOSIS:  UTERINE POLYP  POST-OPERATIVE DIAGNOSIS:  UTERINE POLYP  PROCEDURE:  Procedure(s): DILATATION & CURETTAGE/HYSTEROSCOPY (N/A)  SURGEON:  Surgeon(s) and Role:    * Mert Dietrick C, MD - Primary  ANESTHESIA:   local and MAC  EBL: minimal  BLOOD ADMINISTERED:none  DRAINS: none   LOCAL MEDICATIONS USED:  MARCAINE     SPECIMEN:  Source of Specimen:  uterine curettings with uterine polyp  DISPOSITION OF SPECIMEN:  PATHOLOGY  COUNTS:  YES  TOURNIQUET:  * No tourniquets in log *  DICTATION: .Dragon Dictation  PLAN OF CARE: Discharge to home after PACU  PATIENT DISPOSITION:  PACU - hemodynamically stable.   Delay start of Pharmacological VTE agent (>24hrs) due to surgical blood loss or risk of bleeding: not applicable    The risks, benefits, and alternatives of surgery were explained, understood, and accepted. All questions were answered. Consents were signed. In the operating room MAC anesthesia was applied without complication, and she was placed in the dorsal lithotomy position. Her vagina was prepped and draped in the usual sterile fashion. A bimanual exam revealed a small retroverted mobile uterus. Her adnexa were nonenlarged. A  Pederson speculum was placed and a single-tooth tenaculum was used to grasp the posterior lip of her cervix. A total of 24 mL of 0.5% Marcaine was used to perform a paracervical block. Her uterus sounded to 7 cm. Her cervix was carefully and slowly dilated to accommodate a small curette. 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 A curettage was done in all quadrants. The polyp came out with the curettage so I did not have to use the Myosure device.  A gritty sensation was appreciated throughout. Hysteroscopy confirmed the complete removal of the polyp. There was no bleeding noted at the end of the case. She was taken  to the recovery room after being extubated. She tolerated the procedure well.

## 2019-10-27 NOTE — Transfer of Care (Signed)
Immediate Anesthesia Transfer of Care Note  Patient: Shannon Santiago  Procedure(s) Performed: DILATATION & CURETTAGE/HYSTEROSCOPY (N/A Uterus)  Patient Location: PACU  Anesthesia Type:General  Level of Consciousness: drowsy and patient cooperative  Airway & Oxygen Therapy: Patient Spontanous Breathing and Patient connected to face mask oxygen  Post-op Assessment: Report given to RN and Post -op Vital signs reviewed and stable  Post vital signs: Reviewed and stable  Last Vitals:  Vitals Value Taken Time  BP 126/89 10/27/19 1343  Temp    Pulse 80 10/27/19 1344  Resp 15 10/27/19 1344  SpO2 100 % 10/27/19 1344    Last Pain:  Vitals:   10/27/19 1159  TempSrc: Oral  PainSc: 0-No pain         Complications: No apparent anesthesia complications

## 2019-10-27 NOTE — Anesthesia Preprocedure Evaluation (Signed)
Anesthesia Evaluation  Patient identified by MRN, date of birth, ID band Patient awake    Reviewed: Allergy & Precautions, NPO status , Patient's Chart, lab work & pertinent test results  Airway Mallampati: II  TM Distance: >3 FB Neck ROM: Full    Dental no notable dental hx. (+) Teeth Intact   Pulmonary asthma ,    Pulmonary exam normal breath sounds clear to auscultation       Cardiovascular hypertension, Normal cardiovascular exam Rhythm:Regular Rate:Normal     Neuro/Psych  Headaches, PSYCHIATRIC DISORDERS Anxiety Depression    GI/Hepatic negative GI ROS, Neg liver ROS,   Endo/Other  Hypothyroidism Hyperlipidemia Obesity  Renal/GU negative Renal ROS  negative genitourinary   Musculoskeletal  (+) Arthritis , Osteoarthritis,    Abdominal (+) + obese,   Peds  Hematology negative hematology ROS (+)   Anesthesia Other Findings   Reproductive/Obstetrics Endometrial polyp                             Anesthesia Physical Anesthesia Plan  ASA: II  Anesthesia Plan: General   Post-op Pain Management:    Induction: Intravenous  PONV Risk Score and Plan: Midazolam, Ondansetron, Treatment may vary due to age or medical condition and Dexamethasone  Airway Management Planned: LMA  Additional Equipment:   Intra-op Plan:   Post-operative Plan: Extubation in OR  Informed Consent: I have reviewed the patients History and Physical, chart, labs and discussed the procedure including the risks, benefits and alternatives for the proposed anesthesia with the patient or authorized representative who has indicated his/her understanding and acceptance.     Dental advisory given  Plan Discussed with: CRNA and Surgeon  Anesthesia Plan Comments:         Anesthesia Quick Evaluation

## 2019-10-27 NOTE — Anesthesia Procedure Notes (Signed)
Procedure Name: LMA Insertion Date/Time: 10/27/2019 12:52 PM Performed by: Genelle Bal, CRNA Pre-anesthesia Checklist: Patient identified, Emergency Drugs available, Suction available and Patient being monitored Patient Re-evaluated:Patient Re-evaluated prior to induction Oxygen Delivery Method: Circle system utilized Preoxygenation: Pre-oxygenation with 100% oxygen Induction Type: IV induction Ventilation: Mask ventilation without difficulty LMA: LMA inserted LMA Size: 4.0 Number of attempts: 1 Airway Equipment and Method: Bite block Placement Confirmation: positive ETCO2 Tube secured with: Tape Dental Injury: Teeth and Oropharynx as per pre-operative assessment

## 2019-10-28 ENCOUNTER — Encounter: Payer: Self-pay | Admitting: *Deleted

## 2019-10-28 LAB — SURGICAL PATHOLOGY

## 2019-11-11 ENCOUNTER — Encounter: Payer: Self-pay | Admitting: Osteopathic Medicine

## 2019-11-16 MED ORDER — NIACIN ER (ANTIHYPERLIPIDEMIC) 500 MG PO TBCR: EXTENDED_RELEASE_TABLET | ORAL | 1 refills | Status: DC

## 2019-11-16 MED FILL — NIACIN ER (ANTIHYPERLIPIDEM: 500 | 90 days supply | Qty: 270 | Fill #0

## 2019-11-16 NOTE — Telephone Encounter (Signed)
Patient is aware and did not need to move her appointment sooner with PCP.

## 2019-11-18 ENCOUNTER — Encounter: Payer: Self-pay | Admitting: Obstetrics & Gynecology

## 2019-11-18 ENCOUNTER — Ambulatory Visit (INDEPENDENT_AMBULATORY_CARE_PROVIDER_SITE_OTHER): Payer: No Typology Code available for payment source | Admitting: Obstetrics & Gynecology

## 2019-11-18 ENCOUNTER — Other Ambulatory Visit: Payer: Self-pay

## 2019-11-18 VITALS — BP 122/83 | HR 90 | Temp 98.4°F | Resp 16 | Ht 61.0 in | Wt 168.0 lb

## 2019-11-18 DIAGNOSIS — Z9889 Other specified postprocedural states: Secondary | ICD-10-CM

## 2019-11-18 NOTE — Progress Notes (Signed)
   Subjective:    Patient ID: Shannon Santiago, female    DOB: 02-23-1966, 54 y.o.   MRN: [id number]  HPI 54 yo lady here 3 weeks after a d&c done for PMB with uterine polyp. She had some spotting post op for about a few days. She is not having any problems. She feels like she can void more completely now. She doesn't have any concerns at this point. She went back to work on about POD #3.   Review of Systems     Objective:   Physical Exam Breathing, conversing, and ambulating normally Well nourished, well hydrated White female, no apparent distress Pathology showed benign uterine polyp    Assessment & Plan:  Post op - doing well Will need pap in 2023

## 2019-11-24 ENCOUNTER — Other Ambulatory Visit: Payer: Self-pay | Admitting: Osteopathic Medicine

## 2019-11-24 ENCOUNTER — Ambulatory Visit (INDEPENDENT_AMBULATORY_CARE_PROVIDER_SITE_OTHER): Payer: No Typology Code available for payment source | Admitting: Osteopathic Medicine

## 2019-11-24 ENCOUNTER — Encounter: Payer: Self-pay | Admitting: Osteopathic Medicine

## 2019-11-24 VITALS — BP 125/83 | HR 99 | Temp 97.8°F | Wt 168.1 lb

## 2019-11-24 DIAGNOSIS — F331 Major depressive disorder, recurrent, moderate: Secondary | ICD-10-CM | POA: Diagnosis not present

## 2019-11-24 DIAGNOSIS — I10 Essential (primary) hypertension: Secondary | ICD-10-CM

## 2019-11-24 DIAGNOSIS — F411 Generalized anxiety disorder: Secondary | ICD-10-CM | POA: Diagnosis not present

## 2019-11-24 MED ORDER — ALPRAZOLAM 0.5 MG PO TABS: 0.2500 mg | ORAL_TABLET | Freq: Two times a day (BID) | ORAL | 0 refills | Status: DC | PRN

## 2019-11-24 MED ORDER — BUSPIRONE HCL 15 MG PO TABS: 15.0000 mg | ORAL_TABLET | Freq: Two times a day (BID) | ORAL | 3 refills | Status: DC

## 2019-11-24 MED ORDER — DULOXETINE HCL 60 MG PO CPEP: 120.0000 mg | ORAL_CAPSULE | Freq: Every day | ORAL | 3 refills | Status: DC

## 2019-11-24 MED FILL — ALPRAZolam 0.5 MG TABS: 0.5 | 8 days supply | Qty: 15 | Fill #0

## 2019-11-24 MED FILL — busPIRone HCL 15 MG TABS: 15 | 90 days supply | Qty: 180 | Fill #0

## 2019-11-25 NOTE — Progress Notes (Signed)
Shannon Santiago is a 54 y.o. female who presents to  [place] at [facility]  today, 11/24/19, seeking care for the following:  The primary encounter diagnosis was Moderate episode of recurrent major depressive disorder ([place]). Diagnoses of Generalized anxiety disorder and Hypertension goal BP (blood pressure) < 130/80 were also pertinent to this visit.      ASSESSMENT & PLAN with other pertinent history/findings:  1. Moderate episode of recurrent major depressive disorder ([place]) 2. Generalized anxiety disorder Continue the Cymbalta 60 mg bid and BuSpar 15 mg bid - these are helping Adding alprazolam for SPARING USE pt amenable to this plan, infrquent panic issues, would like to get into counseling eventually but maybe not right now, lot of situational issues   3. Hypertension goal BP (blood pressure) < 130/80 BP Readings from Last 3 Encounters:  11/24/19 125/83  11/18/19 122/83  10/27/19 (!) 130/91        Meds ordered this encounter  Medications  . ALPRAZolam (XANAX) 0.5 MG tablet    Sig: Take 0.5-1 tablets (0.25-0.5 mg total) by mouth 2 (two) times daily as needed for anxiety.    Dispense:  15 tablet    Refill:  0  . DULoxetine (CYMBALTA) 60 MG capsule    Sig: Take 2 capsules (120 mg total) by mouth daily.    Dispense:  180 capsule    Refill:  3  . busPIRone (BUSPAR) 15 MG tablet    Sig: Take 1 tablet (15 mg total) by mouth 2 (two) times daily.    Dispense:  180 tablet    Refill:  3    There are no Patient Instructions on file for this visit.    Follow-up instructions: Return in about 6 months (around 05/23/2020) for [place] (call week prior to visit for lab orders).        BP 125/83 (BP Location: Left Arm, Patient Position: Sitting, Cuff Size: Normal)   Pulse 99   Temp 97.8 F (36.6 C) (Oral)   Wt 168 lb 1.9 oz (76.3 kg)   LMP 06/18/2017   BMI 31.77 kg/m   Current Meds  Medication Sig  . Albuterol Sulfate  (PROAIR RESPICLICK) 123XX123 (90 Base) MCG/ACT AEPB Inhale 1-2 puffs into the lungs every 4 (four) hours as needed. (Patient taking differently: Inhale 1-2 puffs into the lungs every 4 (four) hours as needed. )  . amLODipine (NORVASC) 5 MG tablet Take 1 tablet (5 mg total) by mouth daily. (Patient taking differently: Take 5 mg by mouth daily. )  . Ascorbic Acid (VITAMIN C) 1000 MG tablet Take 1,000 mg by mouth daily.  Marland Kitchen b complex vitamins tablet Take 1 tablet by mouth daily.  . busPIRone (BUSPAR) 15 MG tablet Take 1 tablet (15 mg total) by mouth 2 (two) times daily.  Marland Kitchen CALCIUM PO Take by mouth.  . Cholecalciferol (VITAMIN D3) 10 MCG (400 UNIT) tablet Take 400 Units by mouth daily.  . DULoxetine (CYMBALTA) 60 MG capsule Take 2 capsules (120 mg total) by mouth daily.  Marland Kitchen EPINEPHrine 0.3 mg/0.3 mL IJ SOAJ injection Inject 0.3 mLs (0.3 mg total) into the muscle once as needed (anaphylaxis/allergic reaction). (Patient taking differently: Inject 0.3 mg into the muscle once as needed (anaphylaxis/allergic reaction). )  . EVENING PRIMROSE OIL PO Take by mouth.  . Ferrous Sulfate (IRON PO) Take by mouth.  . fexofenadine (ALLEGRA) 180 MG tablet Take by mouth.  . Ginger, Zingiber officinalis, (GINGER ROOT PO) Take by mouth.  Marland Kitchen  levothyroxine (SYNTHROID) 50 MCG tablet Take 1 tablet (50 mcg total) by mouth daily before breakfast. (Patient taking differently: Take 50 mcg by mouth daily before breakfast. )  . lisinopril (ZESTRIL) 20 MG tablet Take 1 tablet (20 mg total) by mouth daily. (Patient taking differently: Take 20 mg by mouth daily. )  . Magnesium 500 MG CAPS Take by mouth.  . Multiple Vitamin (MULTIVITAMIN) capsule Take 1 capsule by mouth daily.  . niacin (NIASPAN) 500 MG CR tablet Take 3 tablets by mouth at bedtime.  . Omega-3 Fatty Acids (FISH OIL) 1000 MG CAPS Take by mouth.  . Probiotic Product (PROBIOTIC ADVANCED PO) Take by mouth.  . [DISCONTINUED] busPIRone (BUSPAR) 15 MG tablet Take 1 tablet (15 mg  total) by mouth 2 (two) times daily.  . [DISCONTINUED] DULoxetine (CYMBALTA) 60 MG capsule Take 2 capsules (120 mg total) by mouth daily.    No results found for this or any previous visit (from the past 72 hour(s)).  No results found.  Depression screen [facility] 2/9 11/24/2019 09/10/2019 08/12/2019  Decreased Interest 3 1 3   Down, Depressed, Hopeless 1 2 2   PHQ - 2 Score 4 3 5   Altered sleeping 3 3 3   Tired, decreased energy 3 3 3   Change in appetite 1 0 2  Feeling bad or failure about yourself  1 1 2   Trouble concentrating 1 2 3   Moving slowly or fidgety/restless 0 1 2  Suicidal thoughts 0 0 1  PHQ-9 Score 13 13 21   Difficult doing work/chores Somewhat difficult Somewhat difficult Somewhat difficult    GAD 7 : Generalized Anxiety Score 11/24/2019 09/10/2019 08/12/2019 07/01/2019  Nervous, Anxious, on Edge 3 2 3 2   Control/stop worrying 2 2 1 1   Worry too much - different things 1 2 2 1   Trouble relaxing 3 3 3 3   Restless 3 2 3 3   Easily annoyed or irritable 1 3 3 1   Afraid - awful might happen 2 2 1 1   Total GAD 7 Score 15 16 16 12   Anxiety Difficulty Somewhat difficult Somewhat difficult Somewhat difficult -      All questions at time of visit were answered - patient instructed to contact office with any additional concerns or updates.  ER/RTC precautions were reviewed with the patient.  Please note: voice recognition software was used to produce this document, and typos may escape review. Please contact Dr. Sheppard Coil for any needed clarifications.   Total encounter time: 30 minutes.

## 2019-12-20 ENCOUNTER — Other Ambulatory Visit: Payer: Self-pay | Admitting: Physician Assistant

## 2019-12-20 DIAGNOSIS — I10 Essential (primary) hypertension: Secondary | ICD-10-CM

## 2019-12-20 MED FILL — LISINOPRIL 20 MG TABS: 20 | 90 days supply | Qty: 90 | Fill #0

## 2019-12-20 MED FILL — AMLODIPINE BESYLATE 5 MG TA: 5 | 90 days supply | Qty: 90 | Fill #0

## 2019-12-23 ENCOUNTER — Encounter: Payer: Self-pay | Admitting: Osteopathic Medicine

## 2019-12-23 DIAGNOSIS — Z0100 Encounter for examination of eyes and vision without abnormal findings: Secondary | ICD-10-CM

## 2020-01-03 MED FILL — DULoxetine HCL 60 MG CPEP: 60 | 90 days supply | Qty: 180 | Fill #0

## 2020-01-11 MED FILL — XIIDRA 5 % SOLN: 5 | 90 days supply | Qty: 180 | Fill #0

## 2020-01-20 MED FILL — RESTASIS 0.05% EYE EMULSION: 0.05 | 90 days supply | Qty: 180 | Fill #0

## 2020-01-20 MED FILL — LEVOTHYROXINE 50 MCG TABLET: 50 | 90 days supply | Qty: 90 | Fill #2

## 2020-03-08 MED FILL — busPIRone HCL 15 MG TABS: 15 | 90 days supply | Qty: 180 | Fill #1

## 2020-03-10 MED FILL — AMLODIPINE BESYLATE 5 MG TA: 5 | 90 days supply | Qty: 90 | Fill #1

## 2020-03-10 MED FILL — LISINOPRIL 20 MG TABS: 20 | 90 days supply | Qty: 90 | Fill #1

## 2020-04-05 IMAGING — US US PELVIS COMPLETE WITH TRANSVAGINAL
1 series · 13 of 25 positions shown · non-contrast
Comparison: None

CLINICAL DATA: Postmenopausal bleeding



[Series 1: us pelvis complete with transvaginal · 0.17mm/px · 13 of 195 slices shown]
[im 1/195]
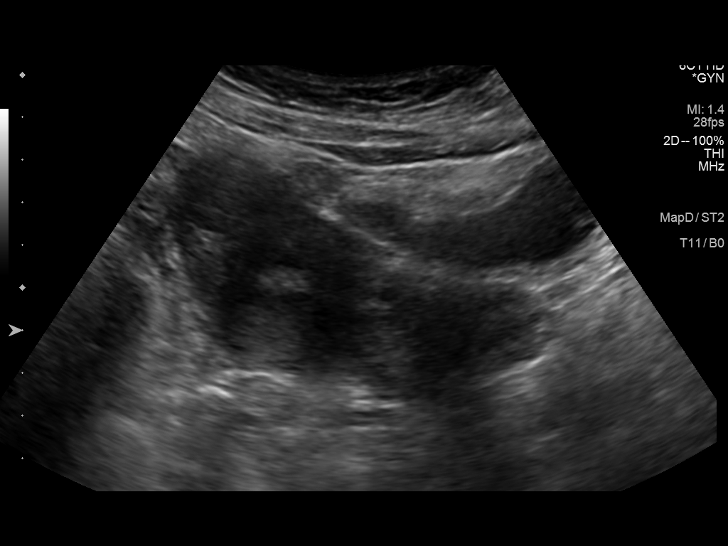
[im 17/195]
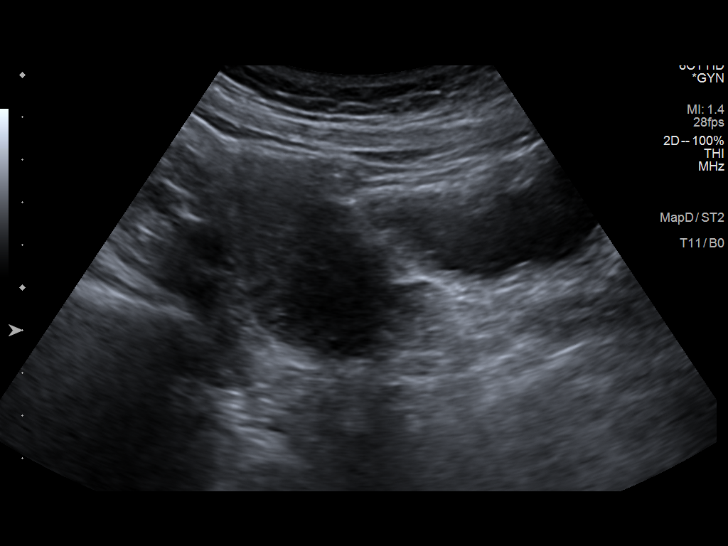
[im 33/195]
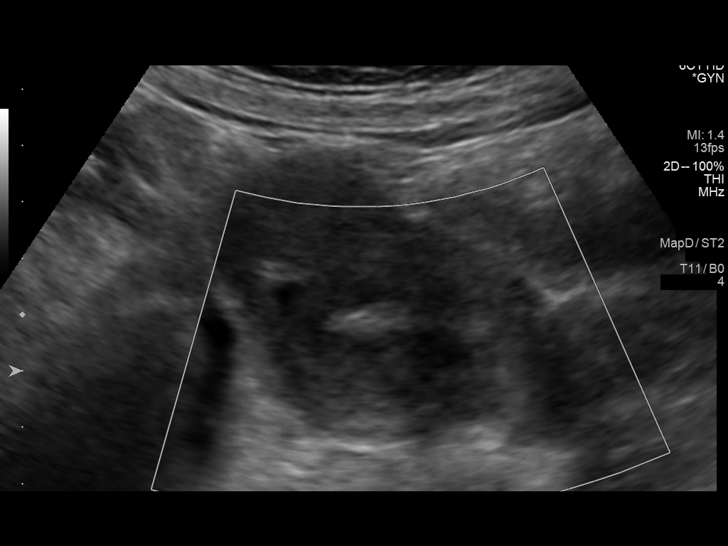
[im 49/195]
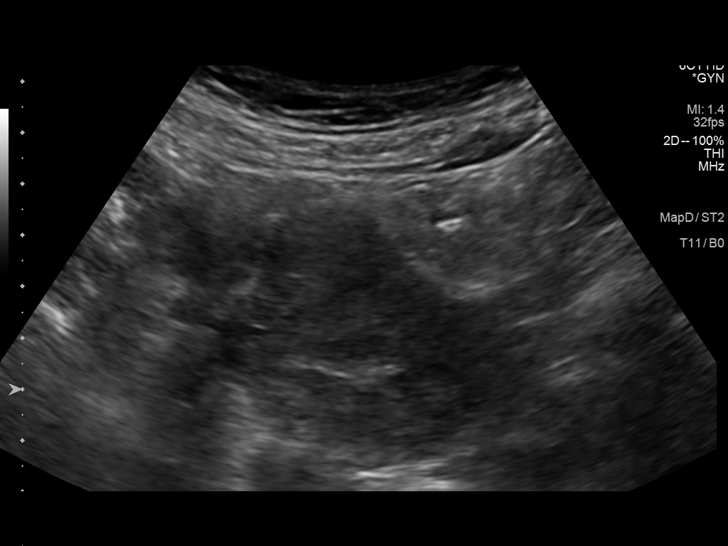
[im 65/195]
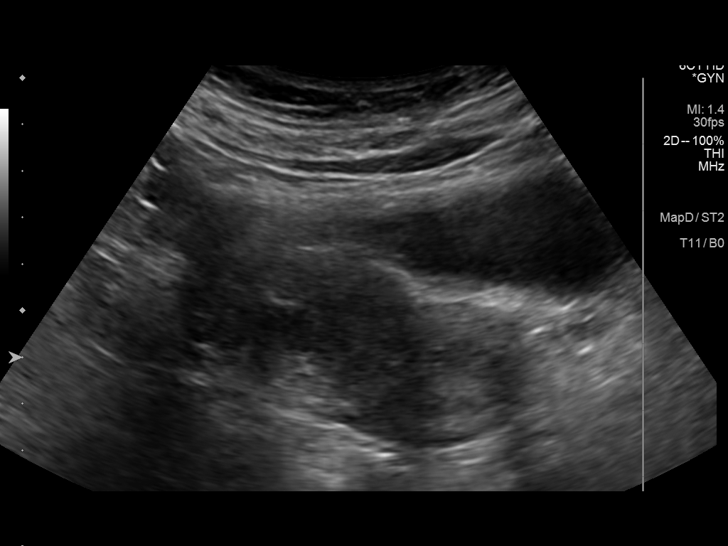
[im 81/195]
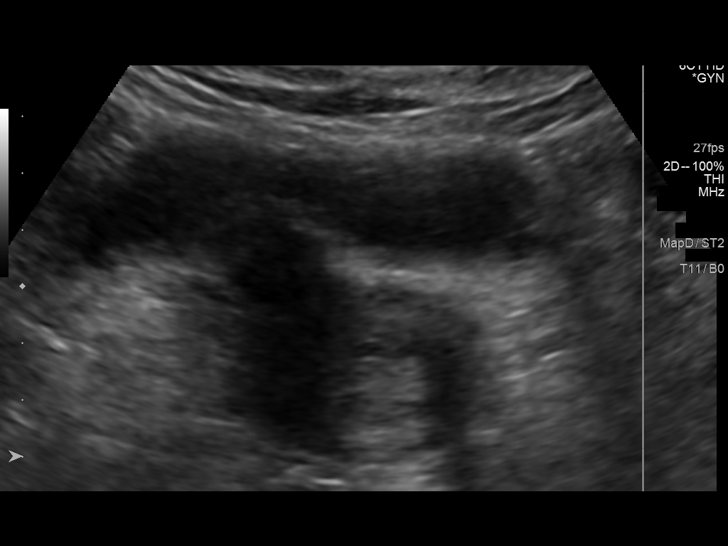
[im 98/195]
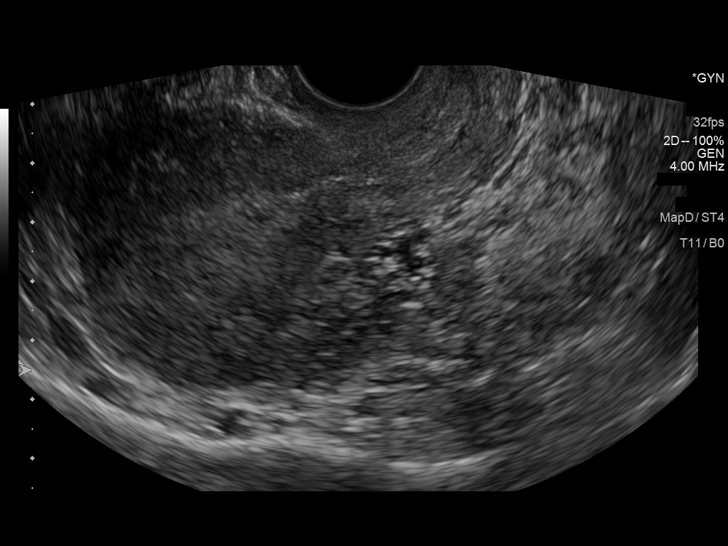
[im 114/195]
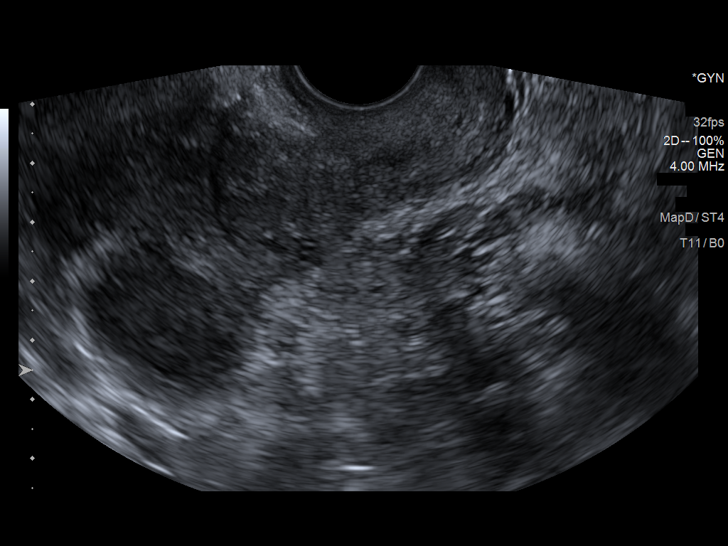
[im 130/195]
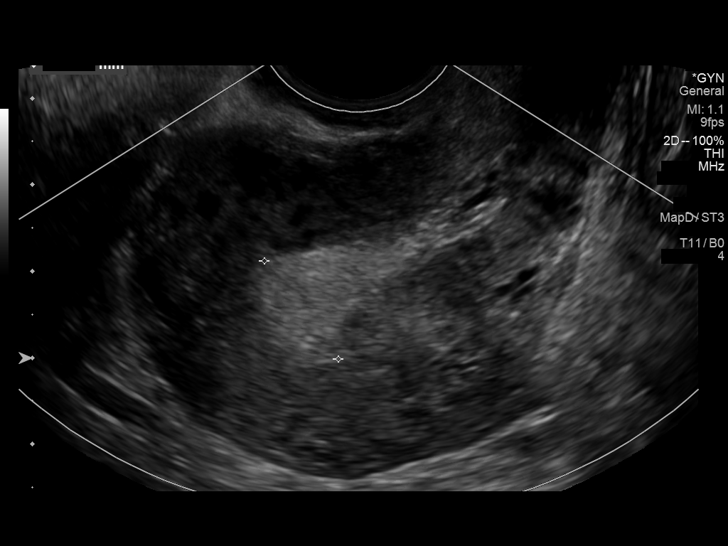
[im 146/195]
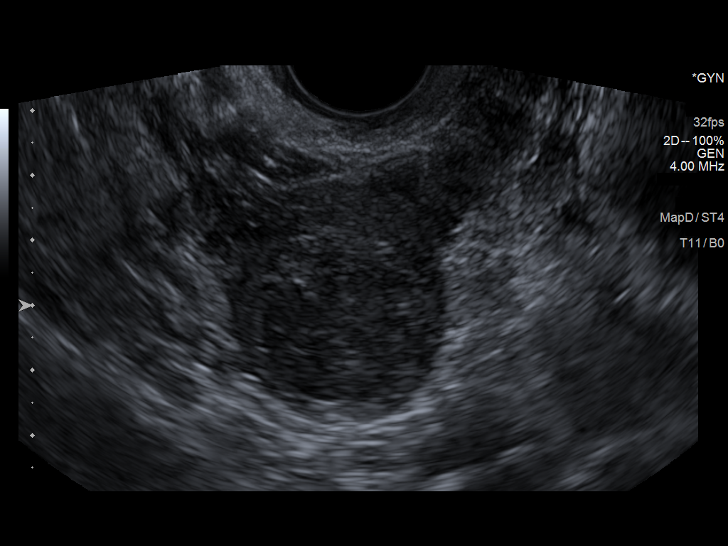
[im 162/195]
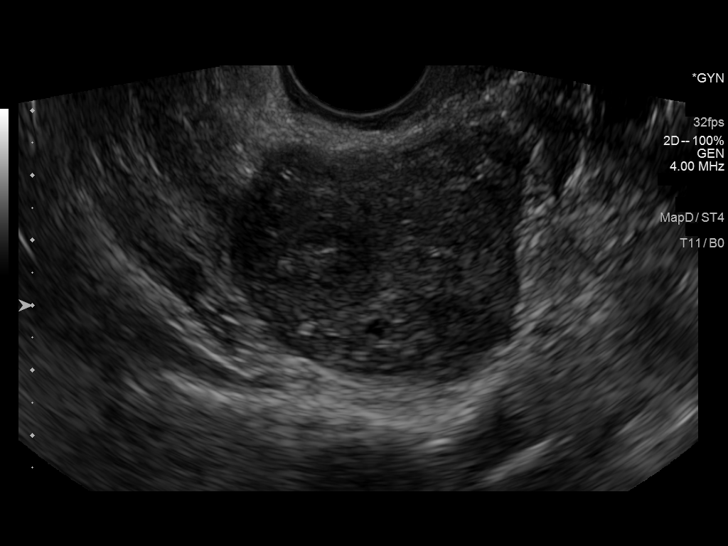
[im 178/195]
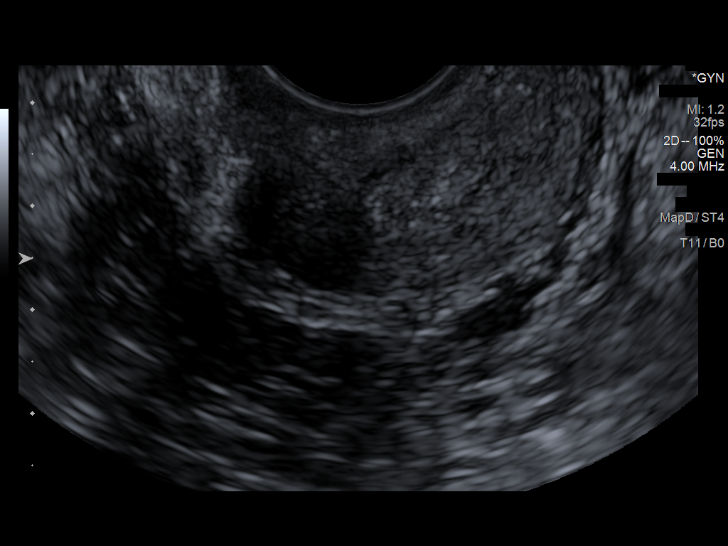
[im 195/195]
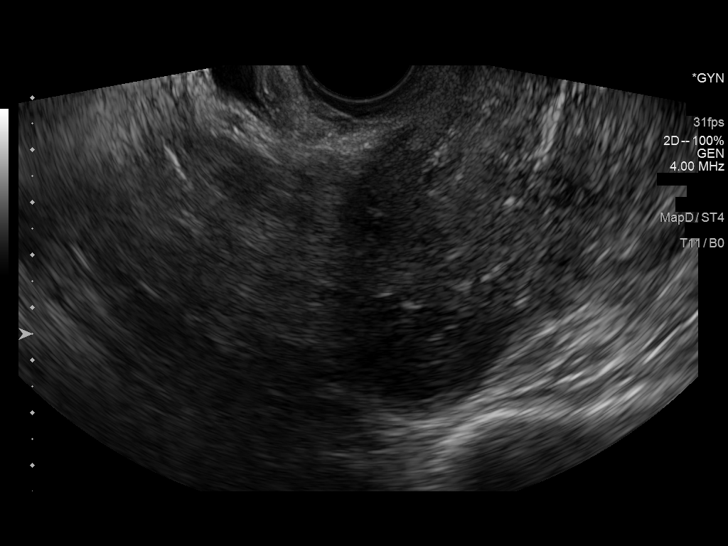

[13 of 25 positions shown; findings below may reference images not displayed]

FINDINGS: Uterus

Measurements: 8.2 x 5.3 x 4.6 cm = volume: 104 mL. Anteverted,
slightly deviated to RIGHT period mildly heterogeneous myometrium.
Subserosal leiomyoma at upper uterine segment 3.2 x 2.1 x 3.3 cm. No
additional masses.

Endometrium

Thickness: 14 mm.  Small amount of endometrial fluid.

Right ovary

Not visualized on either transabdominal or endovaginal imaging,
likely obscured by bowel

Left ovary

Not visualized on either transabdominal or endovaginal imaging,
likely obscured by bowel

Other findings

No free pelvic fluid or adnexal masses.
IMPRESSION: Nonvisualization of ovaries.

Subserosal leiomyoma at upper uterus 3.3 cm diameter.

Abnormal thickening of endometrial complex 14 mm thick with small
amount of endometrial fluid.

In the setting of post-menopausal bleeding, endometrial sampling is
indicated to exclude carcinoma. If results are benign,
sonohysterogram should be considered for focal lesion work-up. (Ref:
Radiological Reasoning: Algorithmic Workup of Abnormal Vaginal
Bleeding with Endovaginal Sonography and Sonohysterography. AJR
[id number]; 191:S68-73)

These results will be called to the ordering clinician or
representative by the Radiologist Assistant, and communication
documented in the PACS or zVision Dashboard. At

## 2020-04-07 ENCOUNTER — Encounter: Payer: Self-pay | Admitting: Osteopathic Medicine

## 2020-04-11 ENCOUNTER — Encounter: Payer: Self-pay | Admitting: Osteopathic Medicine

## 2020-04-11 DIAGNOSIS — J4520 Mild intermittent asthma, uncomplicated: Secondary | ICD-10-CM

## 2020-04-12 MED ORDER — PROAIR RESPICLICK 108 (90 BASE) MCG/ACT IN AEPB: 1.0000 | INHALATION_SPRAY | RESPIRATORY_TRACT | 4 refills | Status: DC | PRN

## 2020-04-12 MED FILL — PROAIR RESPICLICK INHAL PWD: 108 (90 BAS | 17 days supply | Qty: 1 | Fill #0

## 2020-04-12 NOTE — Telephone Encounter (Signed)
Pended RX, last written by Evlyn Clines   Please send if OK

## 2020-04-17 MED FILL — DULOXETINE HCL 60 MG CPEP: 60 | 90 days supply | Qty: 180 | Fill #1

## 2020-05-16 ENCOUNTER — Ambulatory Visit (INDEPENDENT_AMBULATORY_CARE_PROVIDER_SITE_OTHER): Payer: No Typology Code available for payment source | Admitting: Osteopathic Medicine

## 2020-05-16 ENCOUNTER — Encounter: Payer: Self-pay | Admitting: Osteopathic Medicine

## 2020-05-16 ENCOUNTER — Other Ambulatory Visit: Payer: Self-pay

## 2020-05-16 VITALS — BP 128/83 | HR 93 | Ht 61.0 in | Wt 185.0 lb

## 2020-05-16 DIAGNOSIS — R942 Abnormal results of pulmonary function studies: Secondary | ICD-10-CM | POA: Diagnosis not present

## 2020-05-16 DIAGNOSIS — J4540 Moderate persistent asthma, uncomplicated: Secondary | ICD-10-CM | POA: Diagnosis not present

## 2020-05-16 MED ORDER — ALBUTEROL SULFATE HFA 108 (90 BASE) MCG/ACT IN AERS
2.0000 | INHALATION_SPRAY | Freq: Once | RESPIRATORY_TRACT | Status: AC
  Administered 2020-05-16: 2 via RESPIRATORY_TRACT

## 2020-05-16 NOTE — Telephone Encounter (Signed)
Copy of imaging results placed in provider's box.

## 2020-05-16 NOTE — Progress Notes (Signed)
Shannon Santiago is a 54 y.o. female who presents to  [place] at [facility]  today, 05/16/20, seeking care for the following:  . Concern for breathing issues, albuterol not helping, waking at night SOB, some DOE, no CP on exertion, no LOC/dizziness, no coughing, no fever. Ongoing several months. Previous sleep study few years ago WNL per patient. Previous pulmonary visit 2016, pt states she might have records at home, nothing visible in computer system. On exam, CTABL and WOB WNL, RRR WNL S1S2      ASSESSMENT & PLAN with other pertinent findings:  The primary encounter diagnosis was Abnormal PFTs (pulmonary function tests). A diagnosis of Moderate persistent asthma without complication was also pertinent to this visit.   FEV1/FVC >70 FVC <80 Concern for restrictive pattern   No results found for this or any previous visit (from the past 24 hour(s)).  There are no Patient Instructions on file for this visit.  Orders Placed This Encounter  Procedures  . Ambulatory referral to Pulmonology  . PR EVAL OF BRONCHOSPASM    Meds ordered this encounter  Medications  . albuterol (VENTOLIN HFA) 108 (90 Base) MCG/ACT inhaler 2 puff      Follow-up instructions: recheck pending pulmonary evaluation                                          BP 128/83 (BP Location: Left Arm, Patient Position: Sitting, Cuff Size: Large)   Pulse 93   Ht 5\' 1"  (1.549 m)   Wt 185 lb (83.9 kg)   LMP 06/18/2017   SpO2 97%   BMI 34.96 kg/m   Current Meds  Medication Sig  . Albuterol Sulfate (PROAIR RESPICLICK) 440 (90 Base) MCG/ACT AEPB Inhale 1-2 puffs into the lungs every 4 (four) hours as needed.  . ALPRAZolam (XANAX) 0.5 MG tablet Take 0.5-1 tablets (0.25-0.5 mg total) by mouth 2 (two) times daily as needed for anxiety.  Marland Kitchen amLODipine (NORVASC) 5 MG tablet TAKE 1 TABLET (5 MG TOTAL) BY MOUTH DAILY.  Marland Kitchen Ascorbic Acid  (VITAMIN C) 1000 MG tablet Take 1,000 mg by mouth daily.  Marland Kitchen b complex vitamins tablet Take 1 tablet by mouth daily.  . busPIRone (BUSPAR) 15 MG tablet Take 1 tablet (15 mg total) by mouth 2 (two) times daily.  Marland Kitchen CALCIUM PO Take by mouth.  . Cholecalciferol (VITAMIN D3) 10 MCG (400 UNIT) tablet Take 400 Units by mouth daily.  Marland Kitchen EPINEPHrine 0.3 mg/0.3 mL IJ SOAJ injection Inject 0.3 mLs (0.3 mg total) into the muscle once as needed (anaphylaxis/allergic reaction). (Patient taking differently: Inject 0.3 mg into the muscle once as needed (anaphylaxis/allergic reaction). )  . EVENING PRIMROSE OIL PO Take by mouth.  . Ferrous Sulfate (IRON PO) Take by mouth.  . fexofenadine (ALLEGRA) 180 MG tablet Take by mouth.  . Ginger, Zingiber officinalis, (GINGER ROOT PO) Take by mouth.  . levothyroxine (SYNTHROID) 50 MCG tablet Take 1 tablet (50 mcg total) by mouth daily before breakfast. (Patient taking differently: Take 50 mcg by mouth daily before breakfast. )  . lisinopril (ZESTRIL) 20 MG tablet TAKE 1 TABLET (20 MG TOTAL) BY MOUTH DAILY.  . Magnesium 500 MG CAPS Take by mouth.  . Multiple Vitamin (MULTIVITAMIN) capsule Take 1 capsule by mouth daily.  . Omega-3 Fatty Acids (FISH OIL) 1000 MG CAPS Take by mouth.  Marland Kitchen  Probiotic Product (PROBIOTIC ADVANCED PO) Take by mouth.    No results found for this or any previous visit (from the past 72 hour(s)).  No results found.     All questions at time of visit were answered - patient instructed to contact office with any additional concerns or updates.  ER/RTC precautions were reviewed with the patient as applicable.   Please note: voice recognition software was used to produce this document, and typos may escape review. Please contact Dr. Sheppard Coil for any needed clarifications.

## 2020-06-08 ENCOUNTER — Other Ambulatory Visit: Payer: Self-pay | Admitting: Physician Assistant

## 2020-06-08 DIAGNOSIS — E063 Autoimmune thyroiditis: Secondary | ICD-10-CM

## 2020-06-08 MED FILL — RESTASIS 0.05% EYE EMULSION: 0.05 | 90 days supply | Qty: 180 | Fill #1

## 2020-06-08 MED FILL — LEVOTHYROXINE SODIUM 50 MCG: 50 | 30 days supply | Qty: 30 | Fill #0

## 2020-06-21 MED FILL — busPIRone HCL 15 MG TABS: 15 | 90 days supply | Qty: 180 | Fill #2

## 2020-06-27 ENCOUNTER — Institutional Professional Consult (permissible substitution): Payer: No Typology Code available for payment source | Admitting: Pulmonary Disease

## 2020-06-29 ENCOUNTER — Institutional Professional Consult (permissible substitution): Payer: No Typology Code available for payment source | Admitting: Pulmonary Disease

## 2020-07-03 ENCOUNTER — Other Ambulatory Visit: Payer: Self-pay | Admitting: Osteopathic Medicine

## 2020-07-03 DIAGNOSIS — I10 Essential (primary) hypertension: Secondary | ICD-10-CM

## 2020-07-03 MED FILL — LISINOPRIL 20 MG TABS: 20 | 90 days supply | Qty: 90 | Fill #0

## 2020-07-03 MED FILL — AMLODIPINE BESYLATE 5 MG TA: 5 | 90 days supply | Qty: 90 | Fill #0

## 2020-07-04 ENCOUNTER — Ambulatory Visit (INDEPENDENT_AMBULATORY_CARE_PROVIDER_SITE_OTHER): Payer: No Typology Code available for payment source

## 2020-07-04 ENCOUNTER — Encounter: Payer: Self-pay | Admitting: Pulmonary Disease

## 2020-07-04 ENCOUNTER — Other Ambulatory Visit: Payer: Self-pay | Admitting: Pulmonary Disease

## 2020-07-04 ENCOUNTER — Ambulatory Visit: Payer: No Typology Code available for payment source | Admitting: Pulmonary Disease

## 2020-07-04 ENCOUNTER — Other Ambulatory Visit: Payer: Self-pay

## 2020-07-04 VITALS — BP 118/76 | HR 100 | Temp 96.8°F | Ht 61.0 in | Wt 189.0 lb

## 2020-07-04 DIAGNOSIS — R942 Abnormal results of pulmonary function studies: Secondary | ICD-10-CM | POA: Diagnosis not present

## 2020-07-04 DIAGNOSIS — J4540 Moderate persistent asthma, uncomplicated: Secondary | ICD-10-CM

## 2020-07-04 MED ORDER — FLUTICASONE-SALMETEROL 250-50 MCG/DOSE IN AEPB: 1.0000 | INHALATION_SPRAY | Freq: Two times a day (BID) | RESPIRATORY_TRACT | 11 refills | Status: DC

## 2020-07-04 MED FILL — ADVAIR 250/50 DISKUS: 250-50 | 30 days supply | Qty: 60 | Fill #0

## 2020-07-04 NOTE — Patient Instructions (Addendum)
Use Advair 1 puff twice a day for symptoms related to asthma.   We need lung volumes (breathing test) to confirm or rule out restriction. If present, several things could be contributing - scoliosis, hiatal hernia, and weight.  Come back in 3 months with Dr. Silas Flood. Please schedule PFT in coming weeks or prior to next visit in 3 months.

## 2020-07-04 NOTE — Progress Notes (Signed)
Patient ID: Shannon Santiago, female    DOB: September 10, 1966, 54 y.o.   MRN: [id number]  Chief Complaint  Patient presents with  . Pulmonary Consult    Referred by Dr Emeterio Reeve.  Pt states dx with asthma in 2007. She states that she typically has symptoms of coughing, SOB and wheezing in the Spring that tend to resolve but this year they did not. She has albuterol but not not use bc states not helping.     Referring provider: Emeterio Reeve, DO  HPI:   54 year old woman with prior diagnosis of asthma whom are seen in consultation for the evaluation of cough, wheezing, and abnormal spirometry.  Patient has had over the last several months she has had persistent cough, and wheeze.  Cough is nonproductive.  Exacerbated by pollens and being outdoors.  No clear alleviating factors.  Albuterol has not improved the cough.  Wheeze worse when outdoors and seems exacerbated by pollens and change of season.  Typically worse in the spring but then tapers off.  However has persisted throughout the summer and into the early fall now.  Albuterol has not improved the symptom.  She was diagnosed with asthma some years ago in the setting of cough wheeze and shortness of breath.  She endorses a long history of atopic symptoms including recurrent seasonal allergies.  She was placed on Advair Diskus per her report and her symptoms markedly improved.  Symptoms are well controlled that she gradually decreased inhaler regimen and stopped controller medicines altogether.  She was stable for some months until more recently.  Notes her weight has increased some during pandemic as she is no longer as active as she once was, staying at home and not working.  Per review of EMR, weight up about 20 pounds from 11/2019. She feels her chest is restricted from taking deep breaths. This a bit different than prior asthma issues.   She had PFTs at her PCP office.  This was personally reviewed.  This demonstrated proportional  reduction in FEV1 and FVC with normal ratio suggestive of possible restriction, lung volumes would be needed to further evaluate.  She had improvement in peak flow and a nonsignificant improvement in FVC with albuterol.  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  MMRC: No flowsheet data found.  Epworth:  No flowsheet data found.  Tests:   FENO:  No results found for: NITRICOXIDE  PFT: No flowsheet data found.  WALK:  No flowsheet data found.  Imaging: Chest x-ray 06/2015 reviewed, images unable to be seen, report reads no acute process, scoliosis  Lab Results: Reviewed as per EMR, eosinophils is less than 100 05/2019 CBC    Component Value Date/Time   WBC 9.5 06/18/2019 1410   RBC 4.27 06/18/2019 1410   HGB 14.2 06/18/2019 1410   HCT 41.8 06/18/2019 1410   PLT 415 (H) 06/18/2019 1410   MCV 97.9 06/18/2019 1410   MCH 33.3 (H) 06/18/2019 1410   MCHC 34.0 06/18/2019 1410   RDW 12.6 06/18/2019 1410   LYMPHSABS 1,634 06/18/2019 1410   EOSABS 133 06/18/2019 1410   BASOSABS 38 06/18/2019 1410    BMET  Component Value Date/Time   NA 137 04/28/2019 1119   K 4.3 04/28/2019 1119   CL 101 04/28/2019 1119   CO2 27 04/28/2019 1119   GLUCOSE 104 (H) 04/28/2019 1119   BUN 15 04/28/2019 1119   CREATININE 0.80 04/28/2019 1119   CALCIUM 9.8 04/28/2019 1119   GFRNONAA 84 04/28/2019 1119   GFRAA 98 04/28/2019 1119    BNP No results found for: BNP  ProBNP No results found for: PROBNP  Specialty Problems      Pulmonary Problems   Moderate persistent asthma without complication      Allergies   Allergen Reactions  . Morphine And Related Other (See Comments)    Numb hands and feet  . Peanut Oil Other (See Comments)    Stomach cramping, hand/foot swelling  . Penicillins Hives  . Polymyxin B-Trimethoprim Other (See Comments), Photosensitivity and Swelling  . Citalopram Other (See Comments)    SSRI's ineffective - Citalopram, Fluoxetine, Sertraline, Excitalopram   . Food Nausea Only, Swelling, Palpitations, Other (See Comments) and Cough    WHEAT  . Singulair [Montelukast Sodium] Other (See Comments)    Mood changes, worsening depression    Immunization History  Administered Date(s) Administered  . Pneumococcal Polysaccharide-23 12/10/2013  . Tdap 05/05/2015  . Zoster Recombinat (Shingrix) 05/06/2019    Past Medical History:  Diagnosis Date  . Allergy   . Anxiety   . Asthma   . Depression   . Family history of breast cancer   . Family history of colon cancer   . Family history of lung cancer   . Family history of prostate cancer   . Hyperlipidemia   . Hypertension   . Hypothyroidism   . Prolonged PTT (partial thromboplastin time) 06/23/2019   Normal PT  . Thyroid disease     Tobacco History: Social History   Tobacco Use  Smoking Status Never Smoker  Smokeless Tobacco Never Used   Counseling given: Not Answered   Continue to not smoke  Outpatient Encounter Medications as of 07/04/2020  Medication Sig  . Albuterol Sulfate (PROAIR RESPICLICK) 209 (90 Base) MCG/ACT AEPB Inhale 1-2 puffs into the lungs every 4 (four) hours as needed.  . ALPRAZolam (XANAX) 0.5 MG tablet Take 0.5-1 tablets (0.25-0.5 mg total) by mouth 2 (two) times daily as needed for anxiety.  Marland Kitchen amLODipine (NORVASC) 5 MG tablet TAKE 1 TABLET BY MOUTH ONCE DAILY  . Ascorbic Acid (VITAMIN C) 1000 MG tablet Take 1,000 mg by mouth daily.  Marland Kitchen b complex vitamins tablet Take 1 tablet by mouth daily.  . busPIRone (BUSPAR) 15 MG tablet Take 1 tablet (15 mg total) by mouth 2 (two) times daily.  Marland Kitchen  CALCIUM PO Take by mouth.  . Cholecalciferol (VITAMIN D3) 10 MCG (400 UNIT) tablet Take 400 Units by mouth daily.  . DULoxetine (CYMBALTA) 60 MG capsule Take 2 capsules (120 mg total) by mouth daily.  Marland Kitchen EPINEPHrine 0.3 mg/0.3 mL IJ SOAJ injection Inject 0.3 mLs (0.3 mg total) into the muscle once as needed (anaphylaxis/allergic reaction). (Patient taking differently: Inject 0.3 mg into the muscle once as needed (anaphylaxis/allergic reaction). )  . Ferrous Sulfate (IRON PO) Take by mouth.  . Ginger, Zingiber officinalis, (GINGER ROOT PO) Take by mouth.  . levothyroxine (SYNTHROID) 50 MCG tablet TAKE 1 TABLET (50 MCG TOTAL) BY MOUTH DAILY BEFORE BREAKFAST.  Marland Kitchen lisinopril (ZESTRIL) 20 MG tablet TAKE 1 TABLET BY MOUTH ONCE DAILY  . Magnesium 500 MG CAPS Take by mouth.  . Multiple Vitamin (  MULTIVITAMIN) capsule Take 1 capsule by mouth daily.  . Omega-3 Fatty Acids (FISH OIL) 1000 MG CAPS Take by mouth.  . Probiotic Product (PROBIOTIC ADVANCED PO) Take by mouth.  . fexofenadine (ALLEGRA) 180 MG tablet Take by mouth.  . Fluticasone-Salmeterol (ADVAIR DISKUS) 250-50 MCG/DOSE AEPB Inhale 1 puff into the lungs in the morning and at bedtime.  Marland Kitchen ibuprofen (ADVIL) 600 MG tablet Take 1 tablet (600 mg total) by mouth every 6 (six) hours as needed. (Patient not taking: Reported on 11/24/2019)  . niacin (NIASPAN) 500 MG CR tablet Take 3 tablets by mouth at bedtime. (Patient not taking: Reported on 05/16/2020)  . [DISCONTINUED] EVENING PRIMROSE OIL PO Take by mouth.   No facility-administered encounter medications on file as of 07/04/2020.     Review of Systems  Review of Systems  No chest pain with exertion.  No lower extremity edema.  No orthopnea or PND.  Comprehensive review systems otherwise negative. Physical Exam  BP 118/76 (BP Location: Left Arm, Cuff Size: Normal)   Pulse 100   Temp (!) 96.8 F (36 C) (Temporal)   Ht 5\' 1"  (1.549 m)   Wt 189 lb (85.7 kg)   LMP 06/18/2017   SpO2 98% Comment: on  RA  BMI 35.71 kg/m   Wt Readings from Last 5 Encounters:  07/04/20 189 lb (85.7 kg)  05/16/20 185 lb (83.9 kg)  11/24/19 168 lb 1.9 oz (76.3 kg)  11/18/19 168 lb (76.2 kg)  10/27/19 163 lb 12.8 oz (74.3 kg)    BMI Readings from Last 5 Encounters:  07/04/20 35.71 kg/m  05/16/20 34.96 kg/m  11/24/19 31.77 kg/m  11/18/19 31.74 kg/m  10/27/19 30.95 kg/m     Physical Exam General: Well-appearing, sitting up in chair Eyes: EOMI, no icterus Neck: No JVP appreciated sitting upright, supple Respiratory: Clear to auscultation bilaterally, no wheeze Cardiovascular: Regular rate and rhythm, no murmurs Abdomen: Nondistended, bowel sounds present MSK: No synovitis, no joint effusions Neuro: Normal gait, no weakness Psych: Normal mood, full affect   Assessment & Plan:   Wheeze, cough: Suspect this is related to underlying asthma that had not previously well controlled.  No clear trigger.  Sound like he spends more time at home for possible some allergen in the house.  He reports albuterol is not been very helpful.  Somewhat similar symptoms previously well controlled Advair in the past.  Prescribed mid dose Advair discus 1 puff twice a day. CXR today.  Suggestion of restrictive pattern on spirometry: Proportional decrease in FEV1 and FVC.  Will need lung volumes to further evaluate.  However she has several risk factors for restrictive physiology including scoliosis, hiatal hernia, and weight gain/obesity. Lung volumes ordered for better evaluation.    Return in about 3 months (around 10/03/2020).   Lanier Clam, MD 07/04/2020

## 2020-07-05 ENCOUNTER — Telehealth: Payer: Self-pay | Admitting: Pulmonary Disease

## 2020-07-06 NOTE — Telephone Encounter (Signed)
Dr Silas Flood, can you please advise on pt's cxr results? Thanks!

## 2020-07-07 NOTE — Telephone Encounter (Addendum)
Called and spoke with pt. Pt is still waiting to know the results of her cxr.  Dr. Silas Flood, please advise.

## 2020-07-07 NOTE — Telephone Encounter (Signed)
Pt calling back about cxr results. Pt can be reached at [phone].

## 2020-07-11 NOTE — Telephone Encounter (Signed)
Hunsucker, Shannon Gains, MD  You 14 hours ago (5:43 PM)  [place] CXR is clear. This is good news. Thanks!    Attempted to call pt to let her know the results of cxr but unable to reach. Left pt a detailed message letting her know the results of the cxr. Nothing further needed.

## 2020-07-24 ENCOUNTER — Other Ambulatory Visit: Payer: Self-pay | Admitting: Osteopathic Medicine

## 2020-07-24 DIAGNOSIS — E063 Autoimmune thyroiditis: Secondary | ICD-10-CM

## 2020-07-24 MED FILL — DULOXETINE HCL 60 MG CPEP: 60 | 90 days supply | Qty: 180 | Fill #2

## 2020-07-24 MED FILL — LEVOTHYROXINE SODIUM 50 MCG: 50 | 30 days supply | Qty: 30 | Fill #0

## 2020-08-08 ENCOUNTER — Encounter: Payer: Self-pay | Admitting: Osteopathic Medicine

## 2020-08-08 MED FILL — ADVAIR 250/50 DISKUS: 250-50 | 30 days supply | Qty: 60 | Fill #1

## 2020-08-11 ENCOUNTER — Ambulatory Visit (INDEPENDENT_AMBULATORY_CARE_PROVIDER_SITE_OTHER): Payer: No Typology Code available for payment source | Admitting: Osteopathic Medicine

## 2020-08-11 ENCOUNTER — Other Ambulatory Visit: Payer: Self-pay | Admitting: Osteopathic Medicine

## 2020-08-11 ENCOUNTER — Other Ambulatory Visit: Payer: Self-pay

## 2020-08-11 ENCOUNTER — Encounter: Payer: Self-pay | Admitting: Osteopathic Medicine

## 2020-08-11 VITALS — BP 120/83 | HR 101 | Temp 98.5°F | Wt 190.0 lb

## 2020-08-11 DIAGNOSIS — F331 Major depressive disorder, recurrent, moderate: Secondary | ICD-10-CM | POA: Diagnosis not present

## 2020-08-11 DIAGNOSIS — F411 Generalized anxiety disorder: Secondary | ICD-10-CM | POA: Diagnosis not present

## 2020-08-11 DIAGNOSIS — E039 Hypothyroidism, unspecified: Secondary | ICD-10-CM | POA: Diagnosis not present

## 2020-08-11 DIAGNOSIS — R7301 Impaired fasting glucose: Secondary | ICD-10-CM

## 2020-08-11 DIAGNOSIS — I10 Essential (primary) hypertension: Secondary | ICD-10-CM | POA: Diagnosis not present

## 2020-08-11 MED ORDER — BUPROPION HCL ER (XL) 150 MG PO TB24: 150.0000 mg | ORAL_TABLET | ORAL | 0 refills | Status: DC

## 2020-08-11 MED FILL — buPROPion HCL ER (XL) 150 M: 150 | 90 days supply | Qty: 90 | Fill #0

## 2020-08-11 NOTE — Patient Instructions (Addendum)
Will restart Wellbutrin XR 150 mg  Labs today Recheck in 2 weeks / based on labs  For immediate mental health services if needed:  [place], [address] [place], [place] [address], [facility], [address], [place], [place] [address], [phone]  Any emergency room  [employer] [facility] [employer] [place], [phone]

## 2020-08-11 NOTE — Progress Notes (Signed)
Shannon Santiago is a 54 y.o. female who presents to  Center at [facility]  today, 08/11/20, seeking care for the following:  Marland Kitchen Mental health - worsening depression several months, difficulty concentrating, feeling more down. Goes through periods like this, no mania episodes, (+)FH bipolar      ASSESSMENT & PLAN with other pertinent findings:  The primary encounter diagnosis was Generalized anxiety disorder. A diagnosis of Moderate episode of recurrent major depressive disorder (HCC) was also pertinent to this visit. Possible Bipolar II    --> pt would like to trial restarting Wellbutrin at XR formulation, hopefully this will not result in same side effects as IR formulation. Discussed we can also Rx mood stabilizer, pt has concerns aout developing tardive dyskinesia and would like to avoid anything that could cause this. If Wellbutrin not helping, would strongly consider psychiatric referral. See pt instructions as below!   Current Rx: Alprazolam 0.5 mg sparing use, PDMP reviewed last Rx #15, 11/24/19 BuSpar 15 mg bid  Cymbalta 120 mg daily   Previous Rx:  Wellbutrin 75 mg bid - felt great on this but had hand tremors  Prozac Zoloft Celexa Lexapro   Last labs on file: >1 year since CBC, CMP, TSH  Depression screen [facility] 2/9 08/11/2020 11/24/2019 09/10/2019  Decreased Interest 3 3 1   Down, Depressed, Hopeless 3 1 2   PHQ - 2 Score 6 4 3   Altered sleeping 3 3 3   Tired, decreased energy 3 3 3   Change in appetite 3 1 0  Feeling bad or failure about yourself  3 1 1   Trouble concentrating 3 1 2   Moving slowly or fidgety/restless 3 0 1  Suicidal thoughts 1 0 0  PHQ-9 Score 25 13 13   Difficult doing work/chores Very difficult Somewhat difficult Somewhat difficult   GAD 7 : Generalized Anxiety Score 08/11/2020 11/24/2019 09/10/2019 08/12/2019  Nervous, Anxious, on Edge 2 3 2 3   Control/stop worrying 2 2 2 1   Worry too much - different  things 1 1 2 2   Trouble relaxing 3 3 3 3   Restless 3 3 2 3   Easily annoyed or irritable 3 1 3 3   Afraid - awful might happen 2 2 2 1   Total GAD 7 Score 16 15 16 16   Anxiety Difficulty Very difficult Somewhat difficult Somewhat difficult Somewhat difficult      Patient Instructions  Will restart Wellbutrin XR 150 mg  Labs today Recheck in 2 weeks / based on labs  For immediate mental health services if needed:  [facility], [address] [place], [place] [address], [facility], [address] [place], [place] [address], [phone]  Any emergency room  National Suicide Prevention Lifeline, [phone]    Orders Placed This Encounter  Procedures  . CBC  . COMPLETE METABOLIC PANEL WITH GFR  . Lipid panel  . TSH  . Hemoglobin A1c    Meds ordered this encounter  Medications  . buPROPion (WELLBUTRIN XL) 150 MG 24 hr tablet    Sig: Take 1 tablet (150 mg total) by mouth every morning.    Dispense:  90 tablet    Refill:  0       Follow-up instructions: Return in about 2 weeks (around 08/25/2020) for VIRTUAL VISIT or IN-OFFICE VISIT RECHECK MENTAL HEALTH .  BP 120/83 (BP Location: Left Arm, Patient Position: Sitting, Cuff Size: Large)   Pulse (!) 101   Temp 98.5 F (36.9 C) (Oral)   Wt 190 lb 0.6 oz (86.2 kg)   LMP 06/18/2017   BMI 35.91 kg/m   Current Meds  Medication Sig  . Albuterol Sulfate (PROAIR RESPICLICK) 259 (90 Base) MCG/ACT AEPB Inhale 1-2 puffs into the lungs every 4 (four) hours as needed.  . ALPRAZolam (XANAX) 0.5 MG tablet Take 0.5-1 tablets (0.25-0.5 mg total) by mouth 2 (two) times daily as needed for anxiety.  Marland Kitchen amLODipine (NORVASC) 5 MG tablet TAKE 1 TABLET BY MOUTH ONCE DAILY  . Ascorbic Acid (VITAMIN C) 1000 MG tablet Take 1,000 mg by mouth daily.  Marland Kitchen b complex vitamins tablet Take 1 tablet by  mouth daily.  . busPIRone (BUSPAR) 15 MG tablet Take 1 tablet (15 mg total) by mouth 2 (two) times daily.  Marland Kitchen CALCIUM PO Take by mouth.  . Cholecalciferol (VITAMIN D3) 10 MCG (400 UNIT) tablet Take 400 Units by mouth daily.  Marland Kitchen EPINEPHrine 0.3 mg/0.3 mL IJ SOAJ injection Inject 0.3 mLs (0.3 mg total) into the muscle once as needed (anaphylaxis/allergic reaction). (Patient taking differently: Inject 0.3 mg into the muscle once as needed (anaphylaxis/allergic reaction). )  . Ferrous Sulfate (IRON PO) Take by mouth.  . Fluticasone-Salmeterol (ADVAIR DISKUS) 250-50 MCG/DOSE AEPB Inhale 1 puff into the lungs in the morning and at bedtime.  . Ginger, Zingiber officinalis, (GINGER ROOT PO) Take by mouth.  . levothyroxine (SYNTHROID) 50 MCG tablet TAKE 1 TABLET (50 MCG TOTAL) BY MOUTH DAILY BEFORE BREAKFAST. *NO REFILLS/PT IS OVERDUE FOR THYROID TESTING*  . lisinopril (ZESTRIL) 20 MG tablet TAKE 1 TABLET BY MOUTH ONCE DAILY  . Magnesium 500 MG CAPS Take by mouth.  . Multiple Vitamin (MULTIVITAMIN) capsule Take 1 capsule by mouth daily.  . Omega-3 Fatty Acids (FISH OIL) 1000 MG CAPS Take by mouth.  . Probiotic Product (PROBIOTIC ADVANCED PO) Take by mouth.    No results found for this or any previous visit (from the past 72 hour(s)).  No results found.     All questions at time of visit were answered - patient instructed to contact office with any additional concerns or updates.  ER/RTC precautions were reviewed with the patient as applicable.   Please note: voice recognition software was used to produce this document, and typos may escape review. Please contact Dr. Sheppard Coil for any needed clarification .

## 2020-08-12 LAB — LIPID PANEL
Cholesterol: 276 mg/dL — ABNORMAL HIGH (ref <200)
HDL: 44 mg/dL — ABNORMAL LOW (ref >=50)
LDL Cholesterol (Calc): 196 mg/dL (calc) — ABNORMAL HIGH
Non-HDL Cholesterol (Calc): 232 mg/dL (calc) — ABNORMAL HIGH (ref <130)
Total CHOL/HDL Ratio: 6.3 (calc) — ABNORMAL HIGH (ref <5.0)
Triglycerides: 184 mg/dL — ABNORMAL HIGH (ref <150)

## 2020-08-12 LAB — CBC
HCT: 42.7 % (ref 35.0–45.0)
Hemoglobin: 14.4 g/dL (ref 11.7–15.5)
MCH: 33.2 pg — ABNORMAL HIGH (ref 27.0–33.0)
MCHC: 33.7 g/dL (ref 32.0–36.0)
MCV: 98.4 fL (ref 80.0–100.0)
MPV: 10 fL (ref 7.5–12.5)
Platelets: 411 10*3/uL — ABNORMAL HIGH (ref 140–400)
RBC: 4.34 10*6/uL (ref 3.80–5.10)
RDW: 12.1 % (ref 11.0–15.0)
WBC: 8.2 10*3/uL (ref 3.8–10.8)

## 2020-08-12 LAB — COMPLETE METABOLIC PANEL WITH GFR
AG Ratio: 1.6 (calc) (ref 1.0–2.5)
ALT: 39 U/L — ABNORMAL HIGH (ref 6–29)
AST: 24 U/L (ref 10–35)
Albumin: 4.4 g/dL (ref 3.6–5.1)
Alkaline phosphatase (APISO): 70 U/L (ref 37–153)
BUN: 16 mg/dL (ref 7–25)
CO2: 25 mmol/L (ref 20–32)
Calcium: 10 mg/dL (ref 8.6–10.4)
Chloride: 105 mmol/L (ref 98–110)
Creat: 0.88 mg/dL (ref 0.50–1.05)
GFR, Est African American: 86 mL/min/{1.73_m2} (ref >=60)
GFR, Est Non African American: 74 mL/min/{1.73_m2} (ref >=60)
Globulin: 2.7 g/dL (calc) (ref 1.9–3.7)
Glucose, Bld: 109 mg/dL — ABNORMAL HIGH (ref 65–99)
Potassium: 4.5 mmol/L (ref 3.5–5.3)
Sodium: 138 mmol/L (ref 135–146)
Total Bilirubin: 0.5 mg/dL (ref 0.2–1.2)
Total Protein: 7.1 g/dL (ref 6.1–8.1)

## 2020-08-12 LAB — HEMOGLOBIN A1C
Hgb A1c MFr Bld: 6 % of total Hgb — ABNORMAL HIGH (ref <5.7)
Mean Plasma Glucose: 126 (calc)
eAG (mmol/L): 7 (calc)

## 2020-08-12 LAB — TSH: TSH: 4.82 mIU/L — ABNORMAL HIGH

## 2020-08-17 ENCOUNTER — Encounter: Payer: Self-pay | Admitting: Osteopathic Medicine

## 2020-08-17 ENCOUNTER — Other Ambulatory Visit: Payer: Self-pay | Admitting: Osteopathic Medicine

## 2020-08-17 DIAGNOSIS — E063 Autoimmune thyroiditis: Secondary | ICD-10-CM

## 2020-08-17 MED ORDER — LEVOTHYROXINE SODIUM 75 MCG PO TABS: 75.0000 ug | ORAL_TABLET | Freq: Every day | ORAL | 0 refills | Status: DC

## 2020-08-17 NOTE — Addendum Note (Signed)
Addended by: Maryla Morrow on: 08/17/2020 06:12 PM   Modules accepted: Orders

## 2020-08-18 MED FILL — LEVOTHYROXINE SODIUM 75 MCG: 75 | 90 days supply | Qty: 90 | Fill #0

## 2020-08-25 ENCOUNTER — Encounter: Payer: Self-pay | Admitting: Osteopathic Medicine

## 2020-08-25 ENCOUNTER — Other Ambulatory Visit: Payer: Self-pay | Admitting: Osteopathic Medicine

## 2020-08-25 ENCOUNTER — Telehealth (INDEPENDENT_AMBULATORY_CARE_PROVIDER_SITE_OTHER): Payer: No Typology Code available for payment source | Admitting: Osteopathic Medicine

## 2020-08-25 VITALS — Wt 190.0 lb

## 2020-08-25 DIAGNOSIS — F332 Major depressive disorder, recurrent severe without psychotic features: Secondary | ICD-10-CM

## 2020-08-25 DIAGNOSIS — E782 Mixed hyperlipidemia: Secondary | ICD-10-CM

## 2020-08-25 DIAGNOSIS — E063 Autoimmune thyroiditis: Secondary | ICD-10-CM

## 2020-08-25 DIAGNOSIS — E038 Other specified hypothyroidism: Secondary | ICD-10-CM | POA: Diagnosis not present

## 2020-08-25 DIAGNOSIS — R7301 Impaired fasting glucose: Secondary | ICD-10-CM | POA: Diagnosis not present

## 2020-08-25 DIAGNOSIS — I700 Atherosclerosis of aorta: Secondary | ICD-10-CM

## 2020-08-25 DIAGNOSIS — F411 Generalized anxiety disorder: Secondary | ICD-10-CM

## 2020-08-25 MED ORDER — ATORVASTATIN CALCIUM 10 MG PO TABS
10.0000 mg | ORAL_TABLET | Freq: Every day | ORAL | 3 refills | Status: DC
Start: 2020-08-25 — End: 2020-12-06

## 2020-08-25 MED FILL — ATORVASTATIN CALCIUM 10 MG: 10 | 90 days supply | Qty: 90 | Fill #0

## 2020-08-25 NOTE — Patient Instructions (Signed)
Mental health --> let's leave Wellbutrin alone for now, it's only been a few weeks. May consider increasing dose of that Rx, vs adding something to hopefully help w/ bipolar/cyclothymia, vs psychaitry referral. Will discuss more in another 2 weeks.   Thyroid - makes sense given fatigue --> will recheck 6 weeks after Rx dose adjustment to make sure looking better   Lipids - concern for genetic component for high LDL --> starting cholesterol medications which should also treat the following:   Liver - likely elevated due to irritation from high cholesterol, elevated sugars. Will monitor labs on cholesterol medications, hopefully things will improve but I'm not overly concerned about a slight elevation in liver enzymes.   Aortic calcifications on chest Xray, likely due to high cholesterol    Keep me posted if anything changes or gets worse or if any questions, otherwise let's chat again in 2 weeks (my office will call you to set up virtual visit)

## 2020-08-25 NOTE — Progress Notes (Signed)
Virtual Visit via Video (App used: MyChart) Note  I connected with      Shannon Santiago on 08/25/20 at 9:59 AM  by a telemedicine application and verified that I am speaking with the correct person using two identifiers.  Patient is at home I am in office   I discussed the limitations of evaluation and management by telemedicine and the availability of in person appointments. The patient expressed understanding and agreed to proceed.  History of Present Illness: Shannon Santiago is a 54 y.o. female who would like to discuss mental health, labs, cholesterol. Feeling about same on Wellbutrin, depresison maybe a bit better, anxiety maybe a bit worse. See MyChart messages for full details, these were confirmed w/ patient today.     Depression screen [facility] 2/9 08/25/2020 08/11/2020 11/24/2019  Decreased Interest 3 3 3   Down, Depressed, Hopeless 3 3 1   PHQ - 2 Score 6 6 4   Altered sleeping 3 3 3   Tired, decreased energy 3 3 3   Change in appetite 3 3 1   Feeling bad or failure about yourself  3 3 1   Trouble concentrating 3 3 1   Moving slowly or fidgety/restless 2 3 0  Suicidal thoughts 0 1 0  PHQ-9 Score 23 25 13   Difficult doing work/chores Extremely dIfficult Very difficult Somewhat difficult  Some recent data might be hidden   GAD 7 : Generalized Anxiety Score 08/25/2020 08/11/2020 11/24/2019 09/10/2019  Nervous, Anxious, on Edge 3 2 3 2   Control/stop worrying 3 2 2 2   Worry too much - different things 0 1 1 2   Trouble relaxing 3 3 3 3   Restless 3 3 3 2   Easily annoyed or irritable 2 3 1 3   Afraid - awful might happen 2 2 2 2   Total GAD 7 Score 16 16 15 16   Anxiety Difficulty Extremely difficult Very difficult Somewhat difficult Somewhat difficult      Observations/Objective: Wt 190 lb (86.2 kg)   LMP 06/18/2017   BMI 35.90 kg/m  BP Readings from Last 3 Encounters:  08/11/20 120/83  07/04/20 118/76  05/16/20 128/83   Exam: Normal Speech.    Lab and Radiology  Results No results found for this or any previous visit (from the past 72 hour(s)). No results found.       Assessment and Plan: 54 y.o. female with The primary encounter diagnosis was Major depressive disorder, recurrent, severe without psychotic features ([place]). Diagnoses of Mixed hyperlipidemia, Fasting hyperglycemia, Hypothyroidism due to Hashimoto's thyroiditis, Atherosclerosis of aorta ([place]), and Generalized anxiety disorder were also pertinent to this visit.  See pt instructions for plan   PDMP not reviewed this encounter. Orders Placed This Encounter  Procedures  . TSH  . Hepatic function panel  . Lipid panel   Meds ordered this encounter  Medications  . atorvastatin (LIPITOR) 10 MG tablet    Sig: Take 1 tablet (10 mg total) by mouth daily.    Dispense:  90 tablet    Refill:  3     Patient Instructions  Mental health --> let's leave Wellbutrin alone for now, it's only been a few weeks. May consider increasing dose of that Rx, vs adding something to hopefully help w/ bipolar/cyclothymia, vs psychaitry referral. Will discuss more in another 2 weeks.   Thyroid - makes sense given fatigue --> will recheck 6 weeks after Rx dose adjustment to make sure looking better   Lipids - concern for genetic component for high LDL --> starting cholesterol  medications which should also treat the following:   Liver - likely elevated due to irritation from high cholesterol, elevated sugars. Will monitor labs on cholesterol medications, hopefully things will improve but I'm not overly concerned about a slight elevation in liver enzymes.   Aortic calcifications on chest Xray, likely due to high cholesterol    Keep me posted if anything changes or gets worse or if any questions, otherwise let's chat again in 2 weeks (my office will call you to set up virtual visit)    Instructions sent via [place]. If MyChart not available, pt was given option for info via personal e-mail w/ no  guarantee of protected health info over unsecured e-mail communication, and MyChart sign-up instructions were sent to patient.   Follow Up Instructions: Return in about 2 weeks (around 09/08/2020) for [place] .    I discussed the assessment and treatment plan with the patient. The patient was provided an opportunity to ask questions and all were answered. The patient agreed with the plan and demonstrated an understanding of the instructions.   The patient was advised to call back or seek an in-person evaluation if any new concerns, if symptoms worsen or if the condition fails to improve as anticipated.  30 minutes of non-face-to-face time was provided during this encounter.      . . . . . . . . . . . . . Marland Kitchen                   Historical information moved to improve visibility of documentation.  Past Medical History:  Diagnosis Date  . Allergy   . Anxiety   . Asthma   . Depression   . Family history of breast cancer   . Family history of colon cancer   . Family history of lung cancer   . Family history of prostate cancer   . Hyperlipidemia   . Hypertension   . Hypothyroidism   . Prolonged PTT (partial thromboplastin time) 06/23/2019   Normal PT  . Thyroid disease    Past Surgical History:  Procedure Laterality Date  . ENDOMETRIAL BIOPSY  06/21/2019  . EYE SURGERY     x 5  . HYSTEROSCOPY WITH D & C N/A 10/27/2019   Procedure: DILATATION AND CURETTAGE /HYSTEROSCOPY;  Surgeon: Emily Filbert, MD;  Location: [facility];  Service: Gynecology;  Laterality: N/A;   Social History   Tobacco Use  . Smoking status: Never Smoker  . Smokeless tobacco: Never Used  Substance Use Topics  . Alcohol use: Yes    Comment: occ   family history includes Alcohol abuse in her mother; Anxiety disorder in her mother; Breast cancer in her cousin; Colon cancer in her maternal grandmother; Depression in her mother; Diabetes in  her brother and father; Hypertension in her brother, father, and mother; Lung cancer (age of onset: 14) in her mother; Prostate cancer (age of onset: 56) in her father; Prostate cancer (age of onset: 58) in her paternal grandfather; Skin cancer in her father; Stroke in her mother; Thyroid disease in her maternal aunt and maternal uncle.  Medications: Current Outpatient Medications  Medication Sig Dispense Refill  . Albuterol Sulfate (PROAIR RESPICLICK) 426 (90 Base) MCG/ACT AEPB Inhale 1-2 puffs into the lungs every 4 (four) hours as needed. 1 each 4  . ALPRAZolam (XANAX) 0.5 MG tablet Take 0.5-1 tablets (0.25-0.5 mg total) by mouth 2 (two) times daily as needed for anxiety. 15  tablet 0  . amLODipine (NORVASC) 5 MG tablet TAKE 1 TABLET BY MOUTH ONCE DAILY 90 tablet 1  . Ascorbic Acid (VITAMIN C) 1000 MG tablet Take 1,000 mg by mouth daily.    Marland Kitchen b complex vitamins tablet Take 1 tablet by mouth daily.    Marland Kitchen buPROPion (WELLBUTRIN XL) 150 MG 24 hr tablet Take 1 tablet (150 mg total) by mouth every morning. 90 tablet 0  . busPIRone (BUSPAR) 15 MG tablet Take 1 tablet (15 mg total) by mouth 2 (two) times daily. 180 tablet 3  . CALCIUM PO Take by mouth.    . cetirizine (ZYRTEC) 10 MG tablet     . Cholecalciferol (VITAMIN D3) 10 MCG (400 UNIT) tablet Take 400 Units by mouth daily.    Marland Kitchen EPINEPHrine 0.3 mg/0.3 mL IJ SOAJ injection Inject 0.3 mLs (0.3 mg total) into the muscle once as needed (anaphylaxis/allergic reaction). (Patient taking differently: Inject 0.3 mg into the muscle once as needed (anaphylaxis/allergic reaction). ) 1 each 3  . Ferrous Sulfate (IRON PO) Take by mouth.    . Fluticasone-Salmeterol (ADVAIR DISKUS) 250-50 MCG/DOSE AEPB Inhale 1 puff into the lungs in the morning and at bedtime. 60 each 11  . Ginger, Zingiber officinalis, (GINGER ROOT PO) Take by mouth.    . levothyroxine (SYNTHROID) 75 MCG tablet Take 1 tablet (75 mcg total) by mouth daily before breakfast. Will be due for labs  around end of December 2021, early January 2022 90 tablet 0  . lisinopril (ZESTRIL) 20 MG tablet TAKE 1 TABLET BY MOUTH ONCE DAILY 90 tablet 1  . Magnesium 500 MG CAPS Take by mouth.    . Multiple Vitamin (MULTIVITAMIN) capsule Take 1 capsule by mouth daily.    . Omega-3 Fatty Acids (FISH OIL) 1000 MG CAPS Take by mouth.    . Probiotic Product (PROBIOTIC ADVANCED PO) Take by mouth.    Marland Kitchen atorvastatin (LIPITOR) 10 MG tablet Take 1 tablet (10 mg total) by mouth daily. 90 tablet 3  . DULoxetine (CYMBALTA) 60 MG capsule Take 2 capsules (120 mg total) by mouth daily. 180 capsule 3  . fexofenadine (ALLEGRA) 180 MG tablet Take by mouth. (Patient not taking: Reported on 08/11/2020)    . RESTASIS 0.05 % ophthalmic emulsion 1 drop 2 (two) times daily.     No current facility-administered medications for this visit.   Allergies  Allergen Reactions  . Morphine And Related Other (See Comments)    Numb hands and feet  . Peanut Oil Other (See Comments)    Stomach cramping, hand/foot swelling  . Penicillins Hives  . Polymyxin B-Trimethoprim Other (See Comments), Photosensitivity and Swelling  . Citalopram Other (See Comments)    SSRI's ineffective - Citalopram, Fluoxetine, Sertraline, Excitalopram   . Food Nausea Only, Swelling, Palpitations, Other (See Comments) and Cough    WHEAT  . Prednisone Anxiety, Nausea Only, Other (See Comments) and Palpitations  . Singulair [Montelukast Sodium] Other (See Comments)    Mood changes, worsening depression

## 2020-09-06 ENCOUNTER — Encounter: Payer: Self-pay | Admitting: Osteopathic Medicine

## 2020-09-06 ENCOUNTER — Other Ambulatory Visit: Payer: Self-pay | Admitting: Osteopathic Medicine

## 2020-09-06 MED ORDER — ARIPIPRAZOLE 5 MG PO TABS: 5.0000 mg | ORAL_TABLET | Freq: Every day | ORAL | 0 refills | Status: DC

## 2020-09-07 MED FILL — ARIPIPRAZOLE 5 MG TABS: 5 | 30 days supply | Qty: 30 | Fill #0

## 2020-09-11 ENCOUNTER — Encounter: Payer: Self-pay | Admitting: Osteopathic Medicine

## 2020-09-11 ENCOUNTER — Telehealth (INDEPENDENT_AMBULATORY_CARE_PROVIDER_SITE_OTHER): Payer: No Typology Code available for payment source | Admitting: Osteopathic Medicine

## 2020-09-11 DIAGNOSIS — F39 Unspecified mood [affective] disorder: Secondary | ICD-10-CM | POA: Diagnosis not present

## 2020-09-11 DIAGNOSIS — F332 Major depressive disorder, recurrent severe without psychotic features: Secondary | ICD-10-CM | POA: Diagnosis not present

## 2020-09-11 MED ORDER — ARIPIPRAZOLE 5 MG PO TABS
10.0000 mg | ORAL_TABLET | Freq: Every day | ORAL | 0 refills | Status: DC
Start: 2020-09-11 — End: 2020-09-21

## 2020-09-11 MED FILL — ADVAIR 250/50 DISKUS: 250-50 | 30 days supply | Qty: 60 | Fill #2

## 2020-09-11 NOTE — Progress Notes (Signed)
Virtual Visit via Video (App used: MyChart) Note  I connected with      Shannon Santiago on 09/11/20 at 9:14 AM  by a telemedicine application and verified that I am speaking with the correct person using two identifiers.  Patient is at home I am in office   I discussed the limitations of evaluation and management by telemedicine and the availability of in person appointments. The patient expressed understanding and agreed to proceed.  History of Present Illness: Shannon Santiago is a 54 y.o. female who would like to discuss mental health follow-up   Noting improvement since starting the Abilify last week. On reflection, feels there is probably a likelihood of bipolar (financial issues years ago, anxiety)  Would like to try coming off the Wellbutrin       Observations/Objective: LMP 06/18/2017  BP Readings from Last 3 Encounters:  08/11/20 120/83  07/04/20 118/76  05/16/20 128/83   Exam: Normal Speech.  NAD  Lab and Radiology Results No results found for this or any previous visit (from the past 72 hour(s)). No results found.     Assessment and Plan: 54 y.o. female with The primary encounter diagnosis was Major depressive disorder, recurrent, severe without psychotic features ([place]). A diagnosis of Mood disorder (HCC) was also pertinent to this visit.    Patient Instructions  OK to stop the Wellbutrin. If you feel worse off of it, ok to restart.   In a few days, let's try increasing the Abilfy from 5 mg to 10 mg. I'll set MyChart to check in in another week to see how you're doing with the increase!      Instructions sent via MyChart. If MyChart not available, pt was given option for info via personal e-mail w/ no guarantee of protected health info over unsecured e-mail communication, and MyChart sign-up instructions were sent to patient.   Follow Up Instructions: Return for FOLLOW UP VIA [place] (SET TO SEND LATER).    I discussed the assessment  and treatment plan with the patient. The patient was provided an opportunity to ask questions and all were answered. The patient agreed with the plan and demonstrated an understanding of the instructions.   The patient was advised to call back or seek an in-person evaluation if any new concerns, if symptoms worsen or if the condition fails to improve as anticipated.  30 minutes of non-face-to-face time was provided during this encounter.      . . . . . . . . . . . . . Marland Kitchen                   Historical information moved to improve visibility of documentation.  Past Medical History:  Diagnosis Date  . Allergy   . Anxiety   . Asthma   . Depression   . Family history of breast cancer   . Family history of colon cancer   . Family history of lung cancer   . Family history of prostate cancer   . Hyperlipidemia   . Hypertension   . Hypothyroidism   . Prolonged PTT (partial thromboplastin time) 06/23/2019   Normal PT  . Thyroid disease    Past Surgical History:  Procedure Laterality Date  . ENDOMETRIAL BIOPSY  06/21/2019  . EYE SURGERY     x 5  . HYSTEROSCOPY WITH D & C N/A 10/27/2019   Procedure: DILATATION AND CURETTAGE /HYSTEROSCOPY;  Surgeon: Emily Filbert, MD;  Location: [facility];  Service: Gynecology;  Laterality: N/A;   Social History   Tobacco Use  . Smoking status: Never Smoker  . Smokeless tobacco: Never Used  Substance Use Topics  . Alcohol use: Yes    Comment: occ   family history includes Alcohol abuse in her mother; Anxiety disorder in her mother; Breast cancer in her cousin; Colon cancer in her maternal grandmother; Depression in her mother; Diabetes in her brother and father; Hypertension in her brother, father, and mother; Lung cancer (age of onset: 20) in her mother; Prostate cancer (age of onset: 39) in her father; Prostate cancer (age of onset: 48) in her paternal grandfather; Skin cancer in her father; Stroke in her  mother; Thyroid disease in her maternal aunt and maternal uncle.  Medications: Current Outpatient Medications  Medication Sig Dispense Refill  . Albuterol Sulfate (PROAIR RESPICLICK) 010 (90 Base) MCG/ACT AEPB Inhale 1-2 puffs into the lungs every 4 (four) hours as needed. 1 each 4  . ALPRAZolam (XANAX) 0.5 MG tablet Take 0.5-1 tablets (0.25-0.5 mg total) by mouth 2 (two) times daily as needed for anxiety. 15 tablet 0  . amLODipine (NORVASC) 5 MG tablet TAKE 1 TABLET BY MOUTH ONCE DAILY 90 tablet 1  . ARIPiprazole (ABILIFY) 5 MG tablet Take 1 tablet (5 mg total) by mouth daily. 30 tablet 0  . Ascorbic Acid (VITAMIN C) 1000 MG tablet Take 1,000 mg by mouth daily.    Marland Kitchen atorvastatin (LIPITOR) 10 MG tablet Take 1 tablet (10 mg total) by mouth daily. 90 tablet 3  . b complex vitamins tablet Take 1 tablet by mouth daily.    Marland Kitchen buPROPion (WELLBUTRIN XL) 150 MG 24 hr tablet Take 1 tablet (150 mg total) by mouth every morning. 90 tablet 0  . busPIRone (BUSPAR) 15 MG tablet Take 1 tablet (15 mg total) by mouth 2 (two) times daily. 180 tablet 3  . CALCIUM PO Take by mouth.    . cetirizine (ZYRTEC) 10 MG tablet     . Cholecalciferol (VITAMIN D3) 10 MCG (400 UNIT) tablet Take 400 Units by mouth daily.    Marland Kitchen EPINEPHrine 0.3 mg/0.3 mL IJ SOAJ injection Inject 0.3 mLs (0.3 mg total) into the muscle once as needed (anaphylaxis/allergic reaction). (Patient taking differently: Inject 0.3 mg into the muscle once as needed (anaphylaxis/allergic reaction). ) 1 each 3  . Ferrous Sulfate (IRON PO) Take by mouth.    . Fluticasone-Salmeterol (ADVAIR DISKUS) 250-50 MCG/DOSE AEPB Inhale 1 puff into the lungs in the morning and at bedtime. 60 each 11  . Ginger, Zingiber officinalis, (GINGER ROOT PO) Take by mouth.    . levothyroxine (SYNTHROID) 75 MCG tablet Take 1 tablet (75 mcg total) by mouth daily before breakfast. Will be due for labs around end of December 2021, early January 2022 90 tablet 0  . lisinopril (ZESTRIL) 20  MG tablet TAKE 1 TABLET BY MOUTH ONCE DAILY 90 tablet 1  . Magnesium 500 MG CAPS Take by mouth.    . Multiple Vitamin (MULTIVITAMIN) capsule Take 1 capsule by mouth daily.    . Omega-3 Fatty Acids (FISH OIL) 1000 MG CAPS Take by mouth.    . Probiotic Product (PROBIOTIC ADVANCED PO) Take by mouth.    . RESTASIS 0.05 % ophthalmic emulsion 1 drop 2 (two) times daily.    . DULoxetine (CYMBALTA) 60 MG capsule Take 2 capsules (120 mg total) by mouth daily. 180 capsule 3  . fexofenadine (ALLEGRA) 180 MG tablet Take by mouth. (Patient not  taking: Reported on 08/11/2020)     No current facility-administered medications for this visit.   Allergies  Allergen Reactions  . Morphine And Related Other (See Comments)    Numb hands and feet  . Peanut Oil Other (See Comments)    Stomach cramping, hand/foot swelling  . Penicillins Hives  . Polymyxin B-Trimethoprim Other (See Comments), Photosensitivity and Swelling  . Citalopram Other (See Comments)    SSRI's ineffective - Citalopram, Fluoxetine, Sertraline, Excitalopram   . Food Nausea Only, Swelling, Palpitations, Other (See Comments) and Cough    WHEAT  . Prednisone Anxiety, Nausea Only, Other (See Comments) and Palpitations  . Singulair [Montelukast Sodium] Other (See Comments)    Mood changes, worsening depression

## 2020-09-11 NOTE — Patient Instructions (Signed)
OK to stop the Wellbutrin. If you feel worse off of it, ok to restart.   In a few days, let's try increasing the Abilfy from 5 mg to 10 mg. I'll set MyChart to check in in another week to see how you're doing with the increase!

## 2020-09-21 ENCOUNTER — Other Ambulatory Visit: Payer: Self-pay | Admitting: Osteopathic Medicine

## 2020-09-22 ENCOUNTER — Other Ambulatory Visit: Payer: Self-pay | Admitting: Osteopathic Medicine

## 2020-09-22 MED ORDER — ARIPIPRAZOLE 10 MG PO TABS: 10.0000 mg | ORAL_TABLET | Freq: Every day | ORAL | 1 refills | Status: DC

## 2020-09-22 MED ORDER — ARIPIPRAZOLE 5 MG PO TABS
10.0000 mg | ORAL_TABLET | Freq: Every day | ORAL | 1 refills | Status: DC
Start: 2020-09-22 — End: 2020-09-22

## 2020-09-22 MED FILL — ARIPIPRAZOLE 10 MG TABS: 10 | 90 days supply | Qty: 90 | Fill #0

## 2020-09-26 MED FILL — LISINOPRIL 20 MG TABS: 20 | 90 days supply | Qty: 90 | Fill #1

## 2020-09-26 MED FILL — AMLODIPINE BESYLATE 5 MG TA: 5 | 90 days supply | Qty: 90 | Fill #1

## 2020-09-26 MED FILL — busPIRone HCL 15 MG TABS: 15 | 90 days supply | Qty: 180 | Fill #3

## 2020-09-28 ENCOUNTER — Encounter: Payer: Self-pay | Admitting: Osteopathic Medicine

## 2020-10-02 ENCOUNTER — Encounter: Payer: Self-pay | Admitting: Primary Care

## 2020-10-02 ENCOUNTER — Ambulatory Visit (INDEPENDENT_AMBULATORY_CARE_PROVIDER_SITE_OTHER): Payer: No Typology Code available for payment source | Admitting: Pulmonary Disease

## 2020-10-02 ENCOUNTER — Other Ambulatory Visit: Payer: Self-pay | Admitting: Primary Care

## 2020-10-02 ENCOUNTER — Other Ambulatory Visit: Payer: Self-pay

## 2020-10-02 ENCOUNTER — Ambulatory Visit (INDEPENDENT_AMBULATORY_CARE_PROVIDER_SITE_OTHER): Payer: No Typology Code available for payment source | Admitting: Primary Care

## 2020-10-02 DIAGNOSIS — J4540 Moderate persistent asthma, uncomplicated: Secondary | ICD-10-CM

## 2020-10-02 DIAGNOSIS — R942 Abnormal results of pulmonary function studies: Secondary | ICD-10-CM

## 2020-10-02 LAB — PULMONARY FUNCTION TEST
DL/VA % pred: 133 %
DL/VA: 5.85 ml/min/mmHg/L
DLCO cor % pred: 113 %
DLCO cor: 21.15 ml/min/mmHg
DLCO unc % pred: 116 %
DLCO unc: 21.77 ml/min/mmHg
FEF 25-75 Post: 2.3 L/sec
FEF 25-75 Pre: 1.59 L/sec
FEF2575-%Change-Post: 44 %
FEF2575-%Pred-Post: 94 %
FEF2575-%Pred-Pre: 65 %
FEV1-%Change-Post: 7 %
FEV1-%Pred-Post: 83 %
FEV1-%Pred-Pre: 77 %
FEV1-Post: 2.02 L
FEV1-Pre: 1.88 L
FEV1FVC-%Change-Post: 6 %
FEV1FVC-%Pred-Pre: 98 %
FEV6-%Change-Post: 1 %
FEV6-%Pred-Post: 81 %
FEV6-%Pred-Pre: 80 %
FEV6-Post: 2.44 L
FEV6-Pre: 2.41 L
FEV6FVC-%Change-Post: 0 %
FEV6FVC-%Pred-Post: 103 %
FEV6FVC-%Pred-Pre: 102 %
FVC-%Change-Post: 0 %
FVC-%Pred-Post: 78 %
FVC-%Pred-Pre: 78 %
FVC-Post: 2.44 L
FVC-Pre: 2.42 L
Post FEV1/FVC ratio: 83 %
Post FEV6/FVC ratio: 100 %
Pre FEV1/FVC ratio: 78 %
Pre FEV6/FVC Ratio: 99 %
RV % pred: 103 %
RV: 1.78 L
TLC % pred: 89 %
TLC: 4.13 L

## 2020-10-02 MED ORDER — FLUTICASONE PROPIONATE 50 MCG/ACT NA SUSP: 1.0000 | Freq: Every day | NASAL | 2 refills | Status: DC

## 2020-10-02 MED ORDER — OMEPRAZOLE 40 MG PO CPDR: 40.0000 mg | DELAYED_RELEASE_CAPSULE | Freq: Every day | ORAL | 2 refills | Status: DC

## 2020-10-02 MED ORDER — BREO ELLIPTA 100-25 MCG/INH IN AEPB: 1.0000 | INHALATION_SPRAY | Freq: Every day | RESPIRATORY_TRACT | 0 refills | Status: DC

## 2020-10-02 MED FILL — FLUTICASONE PROP 50 MCG SPR: 50 | 60 days supply | Qty: 16 | Fill #0

## 2020-10-02 MED FILL — OMEPRAZOLE 40 MG CPDR: 40 | 30 days supply | Qty: 30 | Fill #0

## 2020-10-02 NOTE — Progress Notes (Signed)
PFT done today. 

## 2020-10-02 NOTE — Patient Instructions (Addendum)
Pulmonary function testing showed mild restriction without clear bronchodilator response. Normal diffusion capacity. Restriction could be from scoliosis, weight gain or asthma. Not likely from pulmonary fibrosis or sarcoidosis as your diffusion capacity was normal along with your chest xray.   Recommendations: - Stop Advair for now - Start BREO take 1 puff once daily (2 week sample given) - Add Flonase nasal spray 1 puff per nostril once daily (for postnasal drip) - Add Omeprazole 40mg  once daily (for GERD)  Follow-up: - 2-4 week televisit with Dr. Silas Flood or Eustaquio Maize NP

## 2020-10-02 NOTE — Progress Notes (Signed)
@Patient  ID: Shannon Santiago, female    DOB: 10-08-1966, 54 y.o.   MRN: [id number]  No chief complaint on file.   Referring provider: Emeterio Reeve, DO  HPI: 54 year old female, never smoked. PMH significant for moderate persistent asthma, hypertension, hyperlipidemia, hashimoto thyroiditis. Patient of Dr. Silas Flood, seen on 07/04/20 for consult.   Previous [facility] pulmonary encounter: 07/04/20  Patient has had over the last several months she has had persistent cough, and wheeze.  Cough is nonproductive.  Exacerbated by pollens and being outdoors.  No clear alleviating factors.  Albuterol has not improved the cough.  Wheeze worse when outdoors and seems exacerbated by pollens and change of season.  Typically worse in the spring but then tapers off.  However has persisted throughout the summer and into the early fall now.  Albuterol has not improved the symptom.  She was diagnosed with asthma some years ago in the setting of cough wheeze and shortness of breath.  She endorses a long history of atopic symptoms including recurrent seasonal allergies.  She was placed on Advair Diskus per her report and her symptoms markedly improved.  Symptoms are well controlled that she gradually decreased inhaler regimen and stopped controller medicines altogether.  She was stable for some months until more recently.  Notes her weight has increased some during pandemic as she is no longer as active as she once was, staying at home and not working.  Per review of EMR, weight up about 20 pounds from 11/2019. She feels her chest is restricted from taking deep breaths. This a bit different than prior asthma issues.   She had PFTs at her PCP office.  This was personally reviewed.  This demonstrated proportional reduction in FEV1 and FVC with normal ratio suggestive of possible restriction, lung volumes would be needed to further evaluate.  She had improvement in peak flow and a nonsignificant improvement in FVC with  albuterol.  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 Continues to have symptoms of dry cough, wheeze and shortness of breath mostly after doing activities. She has noticed some benefit from Advair, states that she is not as winded. She has not needed to use her Albuterol rescue inhaler. She has gained 40lbs in the last year. Her goal weight is 140.    Pulmonary testing: 10/02/2020 PFT - FVC 2.44 (78%), FEV1 2.02 (83%), ratio 83, TLC 89%, DLCOcor 21.15 (113%)  10/02/2020 FENO - 16   Imaging: 07/05/20 CXR - The lungs are well aerated bilaterally. No focal infiltrate or effusion is seen. Scoliosis of the thoracolumbar spine is noted.  Allergies  Allergen Reactions  . Morphine And Related Other (See Comments)    Numb hands and feet  . Peanut Oil Other (See Comments)    Stomach cramping, hand/foot swelling  . Penicillins Hives  . Polymyxin B-Trimethoprim Other (See Comments), Photosensitivity and Swelling  . Citalopram Other (See Comments)    SSRI's ineffective - Citalopram, Fluoxetine, Sertraline, Excitalopram   . Food Nausea Only, Swelling, Palpitations, Other (See Comments) and Cough    WHEAT  . Prednisone Anxiety, Nausea Only, Other (See Comments) and Palpitations  . Singulair [Montelukast Sodium] Other (See  Comments)    Mood changes, worsening depression    Immunization History  Administered Date(s) Administered  . Influenza-Unspecified 08/01/2020  . PFIZER SARS-COV-2 Vaccination 11/10/2019, 12/01/2019  . Pneumococcal Polysaccharide-23 12/10/2013  . Tdap 05/05/2015  . Zoster Recombinat (Shingrix)  05/06/2019    Past Medical History:  Diagnosis Date  . Allergy   . Anxiety   . Asthma   . Depression   . Family history of breast cancer   . Family history of colon cancer   . Family history of lung cancer   . Family history of prostate cancer   . Hyperlipidemia   . Hypertension   . Hypothyroidism   . Prolonged PTT (partial thromboplastin time) 06/23/2019   Normal PT  . Thyroid disease     Tobacco History: Social History   Tobacco Use  Smoking Status Never Smoker  Smokeless Tobacco Never Used   Counseling given: Not Answered   Outpatient Medications Prior to Visit  Medication Sig Dispense Refill  . Albuterol Sulfate (PROAIR RESPICLICK) 937 (90 Base) MCG/ACT AEPB Inhale 1-2 puffs into the lungs every 4 (four) hours as needed. 1 each 4  . ALPRAZolam (XANAX) 0.5 MG tablet Take 0.5-1 tablets (0.25-0.5 mg total) by mouth 2 (two) times daily as needed for anxiety. 15 tablet 0  . amLODipine (NORVASC) 5 MG tablet TAKE 1 TABLET BY MOUTH ONCE DAILY 90 tablet 1  . ARIPiprazole (ABILIFY) 10 MG tablet Take 1 tablet (10 mg total) by mouth daily. 90 tablet 1  . Ascorbic Acid (VITAMIN C) 1000 MG tablet Take 1,000 mg by mouth daily.    Marland Kitchen atorvastatin (LIPITOR) 10 MG tablet Take 1 tablet (10 mg total) by mouth daily. 90 tablet 3  . b complex vitamins tablet Take 1 tablet by mouth daily.    Marland Kitchen buPROPion (WELLBUTRIN XL) 150 MG 24 hr tablet Take 150 mg by mouth daily.    . busPIRone (BUSPAR) 15 MG tablet Take 1 tablet (15 mg total) by mouth 2 (two) times daily. 180 tablet 3  . CALCIUM PO Take by mouth.    . cetirizine (ZYRTEC) 10 MG tablet     . Cholecalciferol (VITAMIN D3) 10 MCG (400 UNIT) tablet Take 400 Units by mouth daily.    . DULoxetine (CYMBALTA) 60 MG capsule Take 2 capsules (120 mg total) by mouth daily. 180 capsule 3  . EPINEPHrine 0.3 mg/0.3 mL IJ SOAJ injection Inject 0.3 mLs (0.3 mg total) into the muscle once as needed (anaphylaxis/allergic reaction). (Patient taking  differently: Inject 0.3 mg into the muscle once as needed (anaphylaxis/allergic reaction). ) 1 each 3  . Ferrous Sulfate (IRON PO) Take by mouth.    . fexofenadine (ALLEGRA) 180 MG tablet Take by mouth. (Patient not taking: Reported on 08/11/2020)    . Ginger, Zingiber officinalis, (GINGER ROOT PO) Take by mouth.    . levothyroxine (SYNTHROID) 75 MCG tablet Take 1 tablet (75 mcg total) by mouth daily before breakfast. Will be due for labs around end of December 2021, early January 2022 90 tablet 0  . lisinopril (ZESTRIL) 20 MG tablet TAKE 1 TABLET BY MOUTH ONCE DAILY 90 tablet 1  . Magnesium 500 MG CAPS Take by mouth.    . Multiple Vitamin (MULTIVITAMIN) capsule Take 1 capsule by mouth daily.    . Omega-3 Fatty Acids (FISH OIL) 1000 MG CAPS Take by mouth.    . Probiotic Product (PROBIOTIC ADVANCED PO) Take by mouth.    . RESTASIS 0.05 % ophthalmic emulsion 1 drop 2 (  two) times daily.    . Fluticasone-Salmeterol (ADVAIR DISKUS) 250-50 MCG/DOSE AEPB Inhale 1 puff into the lungs in the morning and at bedtime. 60 each 11   No facility-administered medications prior to visit.    Review of Systems  Review of Systems  Constitutional: Negative.   HENT: Positive for postnasal drip.   Respiratory: Positive for cough, shortness of breath and wheezing.   Gastrointestinal:       GERD symptoms   Physical Exam  BP 130/78 (BP Location: Left Arm)   Pulse 80   Ht 5\' 1"  (1.549 m)   Wt 188 lb (85.3 kg)   LMP 06/18/2017   SpO2 98%   BMI 35.52 kg/m  Physical Exam Constitutional:      Appearance: Normal appearance.  HENT:     Head: Normocephalic and atraumatic.     Mouth/Throat:     Mouth: Mucous membranes are moist.     Pharynx: Oropharynx is clear.  Cardiovascular:     Rate and Rhythm: Normal rate and regular rhythm.  Pulmonary:     Effort: Pulmonary effort is normal.     Breath sounds: Normal breath sounds. No wheezing or rhonchi.  Musculoskeletal:        General: Normal range of motion.   Skin:    General: Skin is warm and dry.  Neurological:     General: No focal deficit present.     Mental Status: She is alert and oriented to person, place, and time. Mental status is at baseline.  Psychiatric:        Mood and Affect: Mood normal.        Behavior: Behavior normal.        Thought Content: Thought content normal.        Judgment: Judgment normal.      Lab Results:  CBC    Component Value Date/Time   WBC 8.2 08/11/2020 0909   RBC 4.34 08/11/2020 0909   HGB 14.4 08/11/2020 0909   HCT 42.7 08/11/2020 0909   PLT 411 (H) 08/11/2020 0909   MCV 98.4 08/11/2020 0909   MCH 33.2 (H) 08/11/2020 0909   MCHC 33.7 08/11/2020 0909   RDW 12.1 08/11/2020 0909   LYMPHSABS 1,634 06/18/2019 1410   EOSABS 133 06/18/2019 1410   BASOSABS 38 06/18/2019 1410    BMET    Component Value Date/Time   NA 138 08/11/2020 0909   K 4.5 08/11/2020 0909   CL 105 08/11/2020 0909   CO2 25 08/11/2020 0909   GLUCOSE 109 (H) 08/11/2020 0909   BUN 16 08/11/2020 0909   CREATININE 0.88 08/11/2020 0909   CALCIUM 10.0 08/11/2020 0909   GFRNONAA 74 08/11/2020 0909   GFRAA 86 08/11/2020 0909    BNP No results found for: BNP  ProBNP No results found for: PROBNP  Imaging: No results found.   Assessment & Plan:   Moderate persistent asthma without complication Some improvement in dyspnea with addition of Advair 250. Continues to have dry cough, wheeze and shortness of breath after activities. PND and GERD likely contributing to cough as well. PFTs showed mild restriction without significant BD response. Normal lung capacity and diffusion capacity. FENO 16. Restriction could be from scoliosis, weight gain or asthma. Not likely from pulmonary fibrosis or sarcoidosis as her diffusion capacity and chest xray looked normal.   Plan trial BREO 200 (sample in office given).  Add Flonase nasal spray 1 puff per nostril once daily and Omeprazole 40mg  once daily (for GERD). Can desclate  therapy in  the future when symptoms are better controlled. FU in 2-4 with televisit.    Martyn Ehrich, NP 10/03/2020

## 2020-10-03 ENCOUNTER — Encounter: Payer: Self-pay | Admitting: Primary Care

## 2020-10-03 NOTE — Assessment & Plan Note (Addendum)
Some improvement in dyspnea with addition of Advair 250. Continues to have dry cough, wheeze and shortness of breath after activities. PND and GERD likely contributing to cough as well. PFTs showed mild restriction without significant BD response. Normal lung capacity and diffusion capacity. FENO 16. Restriction could be from scoliosis, weight gain or asthma. Not likely from pulmonary fibrosis or sarcoidosis as her diffusion capacity and chest xray looked normal.   Plan trial BREO 200 (sample in office given).  Add Flonase nasal spray 1 puff per nostril once daily and Omeprazole 40mg  once daily (for GERD). Can desclate therapy in the future when symptoms are better controlled. FU in 2-4 with televisit.

## 2020-10-19 ENCOUNTER — Other Ambulatory Visit: Payer: Self-pay | Admitting: Primary Care

## 2020-10-19 ENCOUNTER — Encounter: Payer: Self-pay | Admitting: Primary Care

## 2020-10-19 ENCOUNTER — Ambulatory Visit (INDEPENDENT_AMBULATORY_CARE_PROVIDER_SITE_OTHER): Payer: No Typology Code available for payment source | Admitting: Primary Care

## 2020-10-19 ENCOUNTER — Other Ambulatory Visit: Payer: Self-pay

## 2020-10-19 DIAGNOSIS — J4540 Moderate persistent asthma, uncomplicated: Secondary | ICD-10-CM | POA: Diagnosis not present

## 2020-10-19 DIAGNOSIS — K219 Gastro-esophageal reflux disease without esophagitis: Secondary | ICD-10-CM | POA: Diagnosis not present

## 2020-10-19 DIAGNOSIS — J309 Allergic rhinitis, unspecified: Secondary | ICD-10-CM

## 2020-10-19 MED ORDER — FLUTICASONE-SALMETEROL 250-50 MCG/DOSE IN AEPB: 1.0000 | INHALATION_SPRAY | Freq: Two times a day (BID) | RESPIRATORY_TRACT | 3 refills | Status: DC

## 2020-10-19 MED ORDER — FLUTICASONE PROPIONATE 50 MCG/ACT NA SUSP: 1.0000 | Freq: Every day | NASAL | 5 refills | Status: AC

## 2020-10-19 MED FILL — ADVAIR 250/50 DISKUS: 250-50 | 30 days supply | Qty: 60 | Fill #0

## 2020-10-19 NOTE — Progress Notes (Signed)
Virtual Visit via Telephone Note  I connected with Shannon Santiago on 10/19/20 at  9:00 AM EST by telephone and verified that I am speaking with the correct person using two identifiers.  Location: Patient: Home Provider: Office    I discussed the limitations, risks, security and privacy concerns of performing an evaluation and management service by telephone and the availability of in person appointments. I also discussed with the patient that there may be a patient responsible charge related to this service. The patient expressed understanding and agreed to proceed.   History of Present Illness: 54 year old female, never smoked. PMH significant for moderate persistent asthma, hypertension, hyperlipidemia, hashimoto thyroiditis. Patient of Dr. Judeth Horn, seen on 07/04/20 for consult.   Previous [facility] pulmonary encounter: 07/04/20  Patient has had over the last several months she has had persistent cough, and wheeze.  Cough is nonproductive.  Exacerbated by pollens and being outdoors.  No clear alleviating factors.  Albuterol has not improved the cough.  Wheeze worse when outdoors and seems exacerbated by pollens and change of season.  Typically worse in the spring but then tapers off.  However has persisted throughout the summer and into the early fall now.  Albuterol has not improved the symptom.  She was diagnosed with asthma some years ago in the setting of cough wheeze and shortness of breath.  She endorses a long history of atopic symptoms including recurrent seasonal allergies.  She was placed on Advair Diskus per her report and her symptoms markedly improved.  Symptoms are well controlled that she gradually decreased inhaler regimen and stopped controller medicines altogether.  She was stable for some months until more recently.  Notes her weight has increased some during pandemic as she is no longer as active as she once was, staying at home and not working.  Per review of EMR, weight up about  20 pounds from 11/2019. She feels her chest is restricted from taking deep breaths. This a bit different than prior asthma issues.   She had PFTs at her PCP office.  This was personally reviewed.  This demonstrated proportional reduction in FEV1 and FVC with normal ratio suggestive of possible restriction, lung volumes would be needed to further evaluate.  She had improvement in peak flow and a nonsignificant improvement in FVC with albuterol.  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 Continues to have symptoms of dry cough, wheeze and shortness of breath mostly after doing activities. She has noticed some benefit from Advair, states that she is not as winded. She has not needed to use her Albuterol rescue inhaler. She has gained 40lbs in the last year. Her goal weight is 140.   10/19/2020- Interim hx  Patient contacted today for 2 week follow-up. During last visit we changed Advair to Breo 200. We also added Flonase and omeprazole as PND and GERD likely contributing to cough.  She is doing well, no acute complaints. She states that flonase has made a "world of a difference". It has helped with allergic rhinitis symptoms and she feels better. She reports very rare cough. Addition of ICS/LABA helped a great deal as well, reports that she is able to take  deeper breaths. No difference between Advair vs BREO.    Observations/Objective:  - Able to speak in full sentences; no overt shortness of breath, wheezing or cough   Pulmonary testing: 10/02/2020 PFT - FVC 2.44 (78%), FEV1 2.02 (83%), ratio 83, TLC 89%, DLCOcor 21.15 (113%)  10/02/2020  FENO - 16   Imaging: 07/05/20 CXR - The lungs are well aerated bilaterally. No focal infiltrate or effusion is seen. Scoliosis of the thoracolumbar spine is noted.   Assessment and Plan:  Moderate persistent asthma: -  Pulmonary function testing mild restriction without BD response. Restriction could be from scoliosis, weight gain or asthma. Not likely pulmonary fibrosis as diffusion capacity is normal along with normal chest xray.  - Patient reports improvement in breathing with medium dose ICS/LABA - We will continue Advair 250/60mcg one puff twice daily - FU in 3 months with Dr. Silas Flood  Allergic rhinitis: - Improved with Flonase nasal spray  GERD: - Nocturnal cough improved with Omeprazole 40mg  daily - Continue treatment for total of 6 weeks, can then try off PPI and if cough returns resume   Follow Up Instructions:  3 months with Dr. Silas Flood    I discussed the assessment and treatment plan with the patient. The patient was provided an opportunity to ask questions and all were answered. The patient agreed with the plan and demonstrated an understanding of the instructions.   The patient was advised to call back or seek an in-person evaluation if the symptoms worsen or if the condition fails to improve as anticipated.  I provided 18 minutes of non-face-to-face time during this encounter.   Martyn Ehrich, NP

## 2020-10-19 NOTE — Patient Instructions (Signed)
-   Resume Advair 250-50 one puff twice daily  - Continue Flonase nasal spray 1 puff per nostril daily - Continue Omeprazole 40mg  daily for total of 6 weeks, can then try off medication and if cough returns resume  - Follow up in 3 months with Dr. or Judeth Horn NP

## 2020-10-27 ENCOUNTER — Encounter: Payer: Self-pay | Admitting: Osteopathic Medicine

## 2020-10-27 NOTE — Telephone Encounter (Signed)
Too complicated for an email, needs a full evaluation.  These patients need to stop requesting complex evaluations and diagnoses over email.

## 2020-11-01 MED FILL — OMEPRAZOLE 40 MG CPDR: 40 | 30 days supply | Qty: 30 | Fill #1

## 2020-11-01 MED FILL — DULOXETINE HCL 60 MG CPEP: 60 | 90 days supply | Qty: 180 | Fill #3

## 2020-11-02 ENCOUNTER — Encounter: Payer: Self-pay | Admitting: Osteopathic Medicine

## 2020-11-02 ENCOUNTER — Telehealth (INDEPENDENT_AMBULATORY_CARE_PROVIDER_SITE_OTHER): Payer: No Typology Code available for payment source | Admitting: Osteopathic Medicine

## 2020-11-02 VITALS — HR 96 | Wt 188.0 lb

## 2020-11-02 DIAGNOSIS — F39 Unspecified mood [affective] disorder: Secondary | ICD-10-CM | POA: Diagnosis not present

## 2020-11-02 DIAGNOSIS — F332 Major depressive disorder, recurrent severe without psychotic features: Secondary | ICD-10-CM

## 2020-11-02 NOTE — Patient Instructions (Addendum)
Plan:  Option to change/reduce or discontinue (one at a time) Week 1. Wellbutrin 150 mg --> can stop if desired  Week 2.  Abilify 10 mg --> evening dose;  Week 3. if still having difficulty with concentration Abilify 10 mg in evening -->  half dose to 5 mg in evening Week 4. If still having tremors. BuSpar 15 mg twice daily --> reduce to 10 mg or 7.5 mg    If these measures aren't helping, let me know!   I've placed referral for psychiatry - if we need them then we have the ball rolling on that process. If what we are doing is working, we can cancel the consultation.

## 2020-11-02 NOTE — Progress Notes (Signed)
Telemedicine Visit via  Video & Audio (App used: MyChart) Audio only - telephone (patient preference /  technical difficulty with MyChart video application)  I connected with Shannon Santiago on 11/02/20 at 8:32 AM  by phone or  telemedicine application as noted above  I verified that I am speaking with or regarding  the correct patient using two identifiers.  Participants: Myself, Mikael Spray MS3 and Dr Emeterio Reeve DO Patient: Shannon Santiago Patient proxy if applicable: None  Patient is at home I am in office at [facility]    I discussed the limitations of evaluation and management  by telemedicine and the availability of in person appointments.  The participant(s) above expressed understanding and  agreed to proceed with this appointment via telemedicine.      History of Present Illness: Shannon Santiago is a 55 y.o. female who would like to discuss   Medication side effects: Tremors and confusion/difficulty concentrating   Tremors . Context: initially noticed 3 months ago when on Cymbalta and Buspar at that time it was occurring only 1 time a week and tremors lasted less than 30 min and were not very noticeable. Then about a month ago, shortly after restarting Wellbutrin, the tremors became daily, longer lasting, and more noticeable. Has had similar reaction to Wellbutrin in the past. . Location: all over . Quality: "feels like when I have had low blood sugar" . Duration: 3 months with 1 month of worsening . Timing: occurs in morning on waking and lasts a couple hours most days, but has lasted all day a few times . Modifying factors: nothing makes better or worse; food does not improve symptoms  . Assoc signs/symptoms: mild nausea/abdominal discomfort, weakness in arms and legs.  o No vomiting, palpitations, muscle pain, fevers, abnormal movements of tongue/mouth/face  Confusion/difficulty concentrating  Context: started 3 days after starting  abilify   Quality: feels that mind is moving slower and has to think about tasks that she previously did not have to think about  Duration: a little more than a  Month  Timing: occurs primarily in afternoon a couple hours after taking abilify in mid morning  Modifying factors: wellbutrin has not helped  Assoc signs/symptoms: sleepiness/sedation  No tardive dyskinesia    Observations/Objective: Pulse 96   Wt 188 lb (85.3 kg)   LMP 06/18/2017   BMI 35.52 kg/m  BP Readings from Last 3 Encounters:  10/02/20 130/78  08/11/20 120/83  07/04/20 118/76   Exam: Normal Speech.  No acute distress  Lab and Radiology Results No results found for this or any previous visit (from the past 72 hour(s)). No results found.     Assessment and Plan: 55 y.o. female with The primary encounter diagnosis was Mood disorder ([place]). A diagnosis of Major depressive disorder, recurrent, severe without psychotic features ([place]) was also pertinent to this visit.   Mental health  Given difficulty with medication side effects and difficulty with maintaining an effective treatment regimen will send referral to psychiatry. If changes outlined below are effective at controlling underlying conditions and medication side effects can cancel appointment/consultation.    Tremors - seem to be related to Wellbutrin and Buspar in context of worsening after restarting Wellbutrin, Hx of similar reaction in the past, and being a known side effect of both medications  Plan to discontinue Wellbutrin now as if it has not helped with confusion/difficulty concentrating  If tremors continue to be problematic will reduce Buspar to 10 mg or 7.5 mg  from 15 mg BID  Follow up in 4-6 weeks   Confusion/difficulty concentrating - likely due to Abilify in context of onset of symptoms shortly after starting medication and it being a known side effect  Plan to begin taking Abilify at night rather than morning in one  week  If symptoms persist will reduce evening dose to 5 mg from 10 mg  Follow up in 4-6 weeks; if symptoms still persistent can consider switching to Seroquel for less sedation side effects but will hopefully have psych referral in place at that point if needed     PDMP not reviewed this encounter. No orders of the defined types were placed in this encounter.  No orders of the defined types were placed in this encounter.  Patient Instructions  Plan:  Option to change/reduce or discontinue (one at a time) Week 1. Wellbutrin 150 mg --> can stop if desired  Week 2.  Abilify 10 mg --> evening dose;  Week 3. if still having difficulty with concentration Abilify 10 mg in evening -->  half dose to 5 mg in evening Week 4. If still having tremors. BuSpar 15 mg twice daily --> reduce to 10 mg or 7.5 mg    If these measures aren't helping, let me know!   I've placed referral for psychiatry - if we need them then we have the ball rolling on that process. If what we are doing is working, we can cancel the consultation.       Instructions sent via MyChart.   Follow Up Instructions: Return in about 6 weeks (around 12/14/2020) for VIRTUAL VISIT, RECHECK ON [place] EFFECTS RX .    I discussed the assessment and treatment plan with the patient. The patient was provided an opportunity to ask questions and all were answered. The patient agreed with the plan and demonstrated an understanding of the instructions.   The patient was advised to call back or seek an in-person evaluation if any new concerns, if symptoms worsen or if the condition fails to improve as anticipated.  30 minutes of non-face-to-face time was provided during this encounter.      . . . . . . . . . . . . . Marland Kitchen                   Historical information moved to improve visibility of documentation.  Past Medical History:  Diagnosis Date  . Allergy   .  Anxiety   . Asthma   . Depression   . Family history of breast cancer   . Family history of colon cancer   . Family history of lung cancer   . Family history of prostate cancer   . Hyperlipidemia   . Hypertension   . Hypothyroidism   . Prolonged PTT (partial thromboplastin time) 06/23/2019   Normal PT  . Thyroid disease    Past Surgical History:  Procedure Laterality Date  . ENDOMETRIAL BIOPSY  06/21/2019  . EYE SURGERY     x 5  . HYSTEROSCOPY WITH D & C N/A 10/27/2019   Procedure: DILATATION AND CURETTAGE /HYSTEROSCOPY;  Surgeon: Emily Filbert, MD;  Location: [facility];  Service: Gynecology;  Laterality: N/A;   Social History   Tobacco Use  . Smoking status: Never Smoker  . Smokeless tobacco: Never Used  Substance Use Topics  . Alcohol use: Yes    Comment: occ   family history includes Alcohol abuse in  her mother; Anxiety disorder in her mother; Breast cancer in her cousin; Colon cancer in her maternal grandmother; Depression in her mother; Diabetes in her brother and father; Hypertension in her brother, father, and mother; Lung cancer (age of onset: 54) in her mother; Prostate cancer (age of onset: 13) in her father; Prostate cancer (age of onset: 60) in her paternal grandfather; Skin cancer in her father; Stroke in her mother; Thyroid disease in her maternal aunt and maternal uncle.  Medications: Current Outpatient Medications  Medication Sig Dispense Refill  . Albuterol Sulfate (PROAIR RESPICLICK) 786 (90 Base) MCG/ACT AEPB Inhale 1-2 puffs into the lungs every 4 (four) hours as needed. 1 each 4  . ALPRAZolam (XANAX) 0.5 MG tablet Take 0.5-1 tablets (0.25-0.5 mg total) by mouth 2 (two) times daily as needed for anxiety. 15 tablet 0  . amLODipine (NORVASC) 5 MG tablet TAKE 1 TABLET BY MOUTH ONCE DAILY 90 tablet 1  . ARIPiprazole (ABILIFY) 10 MG tablet Take 1 tablet (10 mg total) by mouth daily. 90 tablet 1  . Ascorbic Acid (VITAMIN C) 1000 MG tablet Take 1,000 mg  by mouth daily.    Marland Kitchen atorvastatin (LIPITOR) 10 MG tablet Take 1 tablet (10 mg total) by mouth daily. 90 tablet 3  . b complex vitamins tablet Take 1 tablet by mouth daily.    Marland Kitchen buPROPion (WELLBUTRIN XL) 150 MG 24 hr tablet Take 150 mg by mouth daily.    . busPIRone (BUSPAR) 15 MG tablet Take 1 tablet (15 mg total) by mouth 2 (two) times daily. 180 tablet 3  . CALCIUM PO Take by mouth.    . cetirizine (ZYRTEC) 10 MG tablet     . Cholecalciferol (VITAMIN D3) 10 MCG (400 UNIT) tablet Take 400 Units by mouth daily.    Marland Kitchen EPINEPHrine 0.3 mg/0.3 mL IJ SOAJ injection Inject 0.3 mLs (0.3 mg total) into the muscle once as needed (anaphylaxis/allergic reaction). 1 each 3  . Ferrous Sulfate (IRON PO) Take by mouth.    . fluticasone (FLONASE) 50 MCG/ACT nasal spray Place 1 spray into both nostrils daily. 16 g 5  . Fluticasone-Salmeterol (ADVAIR DISKUS) 250-50 MCG/DOSE AEPB Inhale 1 puff into the lungs 2 (two) times daily. 60 each 3  . Ginger, Zingiber officinalis, (GINGER ROOT PO) Take by mouth.    . levothyroxine (SYNTHROID) 75 MCG tablet Take 1 tablet (75 mcg total) by mouth daily before breakfast. Will be due for labs around end of December 2021, early January 2022 90 tablet 0  . lisinopril (ZESTRIL) 20 MG tablet TAKE 1 TABLET BY MOUTH ONCE DAILY 90 tablet 1  . Magnesium 500 MG CAPS Take by mouth.    . Multiple Vitamin (MULTIVITAMIN) capsule Take 1 capsule by mouth daily.    . Omega-3 Fatty Acids (FISH OIL) 1000 MG CAPS Take by mouth.    Marland Kitchen omeprazole (PRILOSEC) 40 MG capsule Take 1 capsule (40 mg total) by mouth daily. 30 capsule 2  . Probiotic Product (PROBIOTIC ADVANCED PO) Take by mouth.    . RESTASIS 0.05 % ophthalmic emulsion 1 drop 2 (two) times daily.    . DULoxetine (CYMBALTA) 60 MG capsule Take 2 capsules (120 mg total) by mouth daily. 180 capsule 3   No current facility-administered medications for this visit.   Allergies  Allergen Reactions  . Morphine And Related Other (See Comments)     Numb hands and feet  . Peanut Oil Other (See Comments)    Stomach cramping, hand/foot swelling  . Penicillins  Hives  . Polymyxin B-Trimethoprim Other (See Comments), Photosensitivity and Swelling  . Citalopram Other (See Comments)    SSRI's ineffective - Citalopram, Fluoxetine, Sertraline, Excitalopram   . Food Nausea Only, Swelling, Palpitations, Other (See Comments) and Cough    WHEAT  . Prednisone Anxiety, Nausea Only, Other (See Comments) and Palpitations  . Singulair [Montelukast Sodium] Other (See Comments)    Mood changes, worsening depression     If phone visit, billing and coding can please add appropriate modifier if needed

## 2020-11-07 ENCOUNTER — Other Ambulatory Visit: Payer: Self-pay

## 2020-11-07 ENCOUNTER — Emergency Department (HOSPITAL_BASED_OUTPATIENT_CLINIC_OR_DEPARTMENT_OTHER)
Admission: EM | Admit: 2020-11-07 | Discharge: 2020-11-08 | Disposition: A | Discharge status: Home / Self Care | Payer: No Typology Code available for payment source | Attending: Emergency Medicine | Admitting: Emergency Medicine

## 2020-11-07 ENCOUNTER — Encounter (HOSPITAL_BASED_OUTPATIENT_CLINIC_OR_DEPARTMENT_OTHER): Payer: Self-pay | Admitting: *Deleted

## 2020-11-07 DIAGNOSIS — Z046 Encounter for general psychiatric examination, requested by authority: Secondary | ICD-10-CM | POA: Diagnosis not present

## 2020-11-07 DIAGNOSIS — F329 Major depressive disorder, single episode, unspecified: Secondary | ICD-10-CM | POA: Diagnosis not present

## 2020-11-07 DIAGNOSIS — J4540 Moderate persistent asthma, uncomplicated: Secondary | ICD-10-CM | POA: Insufficient documentation

## 2020-11-07 DIAGNOSIS — Z91010 Allergy to peanuts: Secondary | ICD-10-CM | POA: Diagnosis not present

## 2020-11-07 DIAGNOSIS — I10 Essential (primary) hypertension: Secondary | ICD-10-CM | POA: Insufficient documentation

## 2020-11-07 DIAGNOSIS — Z7951 Long term (current) use of inhaled steroids: Secondary | ICD-10-CM | POA: Insufficient documentation

## 2020-11-07 DIAGNOSIS — Z20822 Contact with and (suspected) exposure to covid-19: Secondary | ICD-10-CM | POA: Diagnosis not present

## 2020-11-07 DIAGNOSIS — R45851 Suicidal ideations: Secondary | ICD-10-CM | POA: Diagnosis not present

## 2020-11-07 DIAGNOSIS — Z79899 Other long term (current) drug therapy: Secondary | ICD-10-CM | POA: Insufficient documentation

## 2020-11-07 DIAGNOSIS — E039 Hypothyroidism, unspecified: Secondary | ICD-10-CM | POA: Diagnosis not present

## 2020-11-07 LAB — RAPID URINE DRUG SCREEN, HOSP PERFORMED
Amphetamines: NOT DETECTED
Barbiturates: NOT DETECTED
Benzodiazepines: NOT DETECTED
Cocaine: NOT DETECTED
Opiates: NOT DETECTED
Tetrahydrocannabinol: NOT DETECTED

## 2020-11-07 LAB — CBC WITH DIFFERENTIAL/PLATELET
Abs Immature Granulocytes: 0.04 10*3/uL (ref 0.00–0.07)
Basophils Absolute: 0.1 10*3/uL (ref 0.0–0.1)
Basophils Relative: 1 %
Eosinophils Absolute: 0.2 10*3/uL (ref 0.0–0.5)
Eosinophils Relative: 2 %
HCT: 42.1 % (ref 36.0–46.0)
Hemoglobin: 13.9 g/dL (ref 12.0–15.0)
Immature Granulocytes: 0 %
Lymphocytes Relative: 16 %
Lymphs Abs: 1.9 10*3/uL (ref 0.7–4.0)
MCH: 32.4 pg (ref 26.0–34.0)
MCHC: 33 g/dL (ref 30.0–36.0)
MCV: 98.1 fL (ref 80.0–100.0)
Monocytes Absolute: 0.7 10*3/uL (ref 0.1–1.0)
Monocytes Relative: 6 %
Neutro Abs: 8.9 10*3/uL — ABNORMAL HIGH (ref 1.7–7.7)
Neutrophils Relative %: 75 %
Platelets: 400 10*3/uL (ref 150–400)
RBC: 4.29 MIL/uL (ref 3.87–5.11)
RDW: 13.1 % (ref 11.5–15.5)
WBC: 11.8 10*3/uL — ABNORMAL HIGH (ref 4.0–10.5)
nRBC: 0 % (ref 0.0–0.2)

## 2020-11-07 LAB — COMPREHENSIVE METABOLIC PANEL
ALT: 35 U/L (ref 0–44)
AST: 22 U/L (ref 15–41)
Albumin: 4.2 g/dL (ref 3.5–5.0)
Alkaline Phosphatase: 78 U/L (ref 38–126)
Anion gap: 11 (ref 5–15)
BUN: 23 mg/dL — ABNORMAL HIGH (ref 6–20)
CO2: 25 mmol/L (ref 22–32)
Calcium: 9 mg/dL (ref 8.9–10.3)
Chloride: 102 mmol/L (ref 98–111)
Creatinine, Ser: 0.93 mg/dL (ref 0.44–1.00)
GFR, Estimated: >60 mL/min (ref >60)
Glucose, Bld: 130 mg/dL — ABNORMAL HIGH (ref 70–99)
Potassium: 4 mmol/L (ref 3.5–5.1)
Sodium: 138 mmol/L (ref 135–145)
Total Bilirubin: 0.3 mg/dL (ref 0.3–1.2)
Total Protein: 7.3 g/dL (ref 6.5–8.1)

## 2020-11-07 LAB — URINALYSIS, ROUTINE W REFLEX MICROSCOPIC
Bilirubin Urine: NEGATIVE
Glucose, UA: NEGATIVE mg/dL
Hgb urine dipstick: NEGATIVE
Ketones, ur: NEGATIVE mg/dL
Leukocytes,Ua: NEGATIVE
Nitrite: NEGATIVE
Protein, ur: NEGATIVE mg/dL
Specific Gravity, Urine: 1.005 (ref 1.005–1.030)
pH: 7 (ref 5.0–8.0)

## 2020-11-07 LAB — ETHANOL: Alcohol, Ethyl (B): <10 mg/dL (ref <10)

## 2020-11-07 LAB — RESP PANEL BY RT-PCR (FLU A&B, COVID) ARPGX2
Influenza A by PCR: NEGATIVE
Influenza B by PCR: NEGATIVE
SARS Coronavirus 2 by RT PCR: NEGATIVE

## 2020-11-07 LAB — ACETAMINOPHEN LEVEL: Acetaminophen (Tylenol), Serum: <10 ug/mL — ABNORMAL LOW (ref 10–30)

## 2020-11-07 LAB — PREGNANCY, URINE: Preg Test, Ur: NEGATIVE

## 2020-11-07 NOTE — ED Provider Notes (Signed)
[facility] EMERGENCY DEPARTMENT Provider Note   CSN: [id number] Arrival date & time: 11/07/20  1417     History Chief Complaint  Patient presents with  . Suicidal    Shannon Santiago is a 55 y.o. female with past medical history of anxiety, major depressive disorder, hyperlipidemia, hypertension that presents to the emergency department today for suicidal thoughts and plan.  Patient states that she is suicidal her whole life, has been on multiple psychiatric medication regimens for this, PCP recently did take her off Abilify.  Patient is still on her other medications including Cymbalta, BuSpar, Wellbutrin, patient states that she has been compliant with these.  Patient states for the past couple of days she has been feeling more suicidal than normal, does have plan to harm herself by overdosing on her pills at home.  Patient states that she is never sought help in the hospital, states that she is never been admitted for this.  States that she has been trying to handle this with her PCP, however for the past couple of days symptoms have been the worst that they have been.  Denies any homicidal intent.  Denies any auditory or visual hallucinations.  Denies any pain, or any other complaints.  Patient is here voluntarily, states that she will stay until she receives help.Marland Kitchen  HPI     Past Medical History:  Diagnosis Date  . Allergy   . Anxiety   . Asthma   . Depression   . Family history of breast cancer   . Family history of colon cancer   . Family history of lung cancer   . Family history of prostate cancer   . Hyperlipidemia   . Hypertension   . Hypothyroidism   . Prolonged PTT (partial thromboplastin time) 06/23/2019   Normal PT  . Thyroid disease     Patient Active Problem List   Diagnosis Date Noted  . Genetic testing 09/15/2019  . Premature atrial complexes 09/10/2019  . Family history of breast cancer   . Family history of lung cancer   . Family history of  colon cancer   . Family history of prostate cancer   . Prolonged PTT (partial thromboplastin time) 06/23/2019  . Plantar fasciitis, right 06/03/2019  . Fasting hyperglycemia 05/03/2019  . Hashimoto's thyroiditis 11/30/2018  . Postmenopausal 11/30/2018  . Moderate episode of recurrent major depressive disorder ([place]) 11/30/2018  . Statin declined 11/30/2018  . Past heart attack 06/28/2015  . Mixed hyperlipidemia 05/04/2015  . Moderate persistent asthma without complication 09/32/3557  . Major depressive disorder, recurrent, severe without psychotic features ([place]) 06/10/2014  . DDD (degenerative disc disease), lumbosacral 12/10/2013  . Chronic headaches 11/24/2013  . Hypertension goal BP (blood pressure) < 130/80 11/24/2013    Past Surgical History:  Procedure Laterality Date  . ENDOMETRIAL BIOPSY  06/21/2019  . EYE SURGERY     x 5  . HYSTEROSCOPY WITH D & C N/A 10/27/2019   Procedure: DILATATION AND CURETTAGE /HYSTEROSCOPY;  Surgeon: Emily Filbert, MD;  Location: [facility];  Service: Gynecology;  Laterality: N/A;     OB History    Gravida  0   Para  0   Term  0   Preterm  0   AB  0   Living  0     SAB  0   IAB  0   Ectopic  0   Multiple  0   Live Births  0  Family History  Problem Relation Age of Onset  . Hypertension Mother   . Stroke Mother   . Lung cancer Mother [id number]  . Anxiety disorder Mother   . Depression Mother   . Alcohol abuse Mother   . Skin cancer Father   . Prostate cancer Father [id number]  . Hypertension Father   . Diabetes Father   . Thyroid disease Maternal Aunt   . Thyroid disease Maternal Uncle   . Diabetes Brother   . Hypertension Brother   . Colon cancer Maternal Grandmother        dx 6540s, d. 4840s  . Prostate cancer Paternal Grandfather [id number]  . Breast cancer Cousin        dx 20s-30s    Social History   Tobacco Use  . Smoking status: Never Smoker  . Smokeless tobacco: Never Used  Vaping Use  . Vaping Use:  Never used  Substance Use Topics  . Alcohol use: Yes    Comment: occ  . Drug use: Never    Home Medications Prior to Admission medications   Medication Sig Start Date End Date Taking? Authorizing Provider  Albuterol Sulfate (PROAIR RESPICLICK) 108 (90 Base) MCG/ACT AEPB Inhale 1-2 puffs into the lungs every 4 (four) hours as needed. 04/12/20   Sunnie NielsenAlexander, Natalie, DO  ALPRAZolam Prudy Feeler(XANAX) 0.5 MG tablet Take 0.5-1 tablets (0.25-0.5 mg total) by mouth 2 (two) times daily as needed for anxiety. 11/24/19   Sunnie NielsenAlexander, Natalie, DO  amLODipine (NORVASC) 5 MG tablet TAKE 1 TABLET BY MOUTH ONCE DAILY 07/03/20   Sunnie NielsenAlexander, Natalie, DO  ARIPiprazole (ABILIFY) 10 MG tablet Take 1 tablet (10 mg total) by mouth daily. 09/22/20   Sunnie NielsenAlexander, Natalie, DO  Ascorbic Acid (VITAMIN C) 1000 MG tablet Take 1,000 mg by mouth daily.    [provider]  atorvastatin (LIPITOR) 10 MG tablet Take 1 tablet (10 mg total) by mouth daily. 08/25/20   Sunnie NielsenAlexander, Natalie, DO  b complex vitamins tablet Take 1 tablet by mouth daily.    [provider]  buPROPion (WELLBUTRIN XL) 150 MG 24 hr tablet Take 150 mg by mouth daily.    [provider]  busPIRone (BUSPAR) 15 MG tablet Take 1 tablet (15 mg total) by mouth 2 (two) times daily. 11/24/19   Sunnie NielsenAlexander, Natalie, DO  CALCIUM PO Take by mouth.    [provider]  cetirizine (ZYRTEC) 10 MG tablet  07/05/20   [provider]  Cholecalciferol (VITAMIN D3) 10 MCG (400 UNIT) tablet Take 400 Units by mouth daily.    [provider]  DULoxetine (CYMBALTA) 60 MG capsule Take 2 capsules (120 mg total) by mouth daily. 11/24/19 07/04/20  Sunnie NielsenAlexander, Natalie, DO  EPINEPHrine 0.3 mg/0.3 mL IJ SOAJ injection Inject 0.3 mLs (0.3 mg total) into the muscle once as needed (anaphylaxis/allergic reaction). 05/06/19   Carlis Stableummings, Charley Elizabeth, PA-C  Ferrous Sulfate (IRON PO) Take by mouth.    [provider]  fluticasone (FLONASE) 50 MCG/ACT nasal  spray Place 1 spray into both nostrils daily. 10/19/20   Glenford BayleyWalsh, Elizabeth W, NP  Fluticasone-Salmeterol (ADVAIR DISKUS) 250-50 MCG/DOSE AEPB Inhale 1 puff into the lungs 2 (two) times daily. 10/19/20   Glenford BayleyWalsh, Elizabeth W, NP  Ginger, Zingiber officinalis, (GINGER ROOT PO) Take by mouth.    [provider]  levothyroxine (SYNTHROID) 75 MCG tablet Take 1 tablet (75 mcg total) by mouth daily before breakfast. Will be due for labs around end of December 2021, early January 2022 08/17/20   Lyn HollingsheadAlexander,  Natalie, DO  lisinopril (ZESTRIL) 20 MG tablet TAKE 1 TABLET BY MOUTH ONCE DAILY 07/03/20   Emeterio Reeve, DO  Magnesium 500 MG CAPS Take by mouth.    [provider]  Multiple Vitamin (MULTIVITAMIN) capsule Take 1 capsule by mouth daily.    [provider]  Omega-3 Fatty Acids (FISH OIL) 1000 MG CAPS Take by mouth.    [provider]  omeprazole (PRILOSEC) 40 MG capsule Take 1 capsule (40 mg total) by mouth daily. 10/02/20   Martyn Ehrich, NP  Probiotic Product (PROBIOTIC ADVANCED PO) Take by mouth.    [provider]  RESTASIS 0.05 % ophthalmic emulsion 1 drop 2 (two) times daily. 06/08/20   [provider]    Allergies    Morphine and related, Peanut oil, Penicillins, Polymyxin b-trimethoprim, Citalopram, Food, Prednisone, and Singulair [montelukast sodium]  Review of Systems   Review of Systems  Constitutional: Negative for diaphoresis, fatigue and fever.  Eyes: Negative for visual disturbance.  Respiratory: Negative for shortness of breath.   Cardiovascular: Negative for chest pain.  Gastrointestinal: Negative for nausea and vomiting.  Musculoskeletal: Negative for back pain and myalgias.  Skin: Negative for color change, pallor, rash and wound.  Neurological: Negative for syncope, weakness, light-headedness, numbness and headaches.  Psychiatric/Behavioral: Positive for suicidal ideas. Negative for behavioral problems and confusion.     Physical Exam Updated Vital Signs BP (!) 156/101   Pulse (!) 118   Temp 98.1 F (36.7 C) (Oral)   Resp 18   Ht 5\' 1"  (1.549 m)   Wt 85.3 kg   LMP 06/18/2017   SpO2 99%   BMI 35.52 kg/m   Physical Exam Constitutional:      General: She is not in acute distress.    Appearance: Normal appearance. She is not ill-appearing, toxic-appearing or diaphoretic.     Comments: Pleasant 55 year old female sitting on exam table.  HENT:     Head: Atraumatic.  Cardiovascular:     Rate and Rhythm: Normal rate and regular rhythm.     Pulses: Normal pulses.  Pulmonary:     Effort: Pulmonary effort is normal. No respiratory distress.     Breath sounds: Normal breath sounds. No stridor.  Musculoskeletal:        General: Normal range of motion.  Skin:    General: Skin is warm and dry.     Capillary Refill: Capillary refill takes less than 2 seconds.  Neurological:     General: No focal deficit present.     Mental Status: She is alert and oriented to person, place, and time.  Psychiatric:        Attention and Perception: Attention normal.        Mood and Affect: Affect is blunt and flat.        Speech: Speech normal.        Behavior: Behavior is withdrawn.        Thought Content: Thought content includes suicidal ideation. Thought content includes suicidal plan.        Cognition and Memory: Cognition normal.     ED Results / Procedures / Treatments   Labs (all labs ordered are listed, but only abnormal results are displayed) Labs Reviewed  ACETAMINOPHEN LEVEL - Abnormal; Notable for the following components:      Result Value   Acetaminophen (Tylenol), Serum <10 (*)    All other components within normal limits  CBC WITH DIFFERENTIAL/PLATELET - Abnormal; Notable for the following components:   WBC  11.8 (*)    Neutro Abs 8.9 (*)    All other components within normal limits  COMPREHENSIVE METABOLIC PANEL - Abnormal; Notable for the following components:   Glucose, Bld 130 (*)     BUN 23 (*)    All other components within normal limits  RESP PANEL BY RT-PCR (FLU A&B, COVID) ARPGX2  URINALYSIS, ROUTINE W REFLEX MICROSCOPIC  ETHANOL  RAPID URINE DRUG SCREEN, HOSP PERFORMED  PREGNANCY, URINE    EKG EKG Interpretation  Date/Time:  Tuesday November 07 2020 15:24:09 EST Ventricular Rate:  106 PR Interval:  158 QRS Duration: 72 QT Interval:  340 QTC Calculation: 451 R Axis:   -4 Text Interpretation: Sinus tachycardia Possible Inferior infarct , age undetermined Cannot rule out Anterior infarct , age undetermined Abnormal ECG No previous ECGs available Confirmed by Fredia Sorrow [phone]) on 11/07/2020 3:38:15 PM   Radiology No results found.  Procedures Procedures (including critical care time)  Medications Ordered in ED Medications - No data to display  ED Course  I have reviewed the triage vital signs and the nursing notes.  Pertinent labs & imaging results that were available during my care of the patient were reviewed by me and considered in my medical decision making (see chart for details).    MDM Rules/Calculators/A&P                          Shannon Santiago is a 55 y.o. female with past medical history of anxiety, major depressive disorder, hyperlipidemia, hypertension that presents to the emergency department today for suicidal thoughts and plan.  Patient with plan to harm herself by overdosing on her psychiatric medications, has never sought help in the emergency department before.  Patient is here voluntarily, did discuss that if patient does try to escape she will need to be committed.  Patient understands, however at this time I do not suspect that patient will try to escape, patient states that she came here to receive help.  Patient is a pleasant 55 year old female.  Normal physical exam.  At this time patient to be medically cleared and awaiting psych disposition.   At shift change patient still has not been evaluated by TTS.  We will make  provider Default.  Charge nurse did try to contact TTS to evaluate timeframe for them to evaluate patient.  Work-up today benign with COVID-negative, CMP and CBC unremarkable.  Urinalysis negative.  Drug screen negative.  Patient remains voluntary.  Oncoming team made aware.  Final Clinical Impression(s) / ED Diagnoses Final diagnoses:  Suicidal ideations    Rx / DC Orders ED Discharge Orders    None       Alfredia Client, PA-C 11/07/20 1915    Fredia Sorrow, MD 11/07/20 2252

## 2020-11-07 NOTE — ED Triage Notes (Signed)
C/o SI x 3 days with plan, denies HI, pr reports psch med changes x 3 weeks

## 2020-11-07 NOTE — ED Notes (Signed)
Covid Swab obtained and to the lab (Lab Tech informed Covid Swab done for admission requirements)

## 2020-11-07 NOTE — ED Notes (Signed)
PT changed into scrubs per protocol. PT verbalized understanding of safety precautions and medical clearance procedures. All belongings have been checked and PT wanded by security. PT verbalized that family member present up front can take belongings home. EKG and Labs obtained and pt tolerated all procedures well. PT calm and cooperative at this time and has no further questions.

## 2020-11-07 NOTE — ED Notes (Signed)
Pt ambulated to room, very alert and oriented, calm and cooperative, interacts with nsg staff, answers questions appropriately
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
## 2020-11-08 ENCOUNTER — Inpatient Hospital Stay (HOSPITAL_COMMUNITY)
Admission: AD | Admit: 2020-11-08 | Discharge: 2020-11-10 | DRG: 885 | Disposition: A | Discharge status: Home / Self Care | Payer: No Typology Code available for payment source | Source: Intra-hospital | Attending: Psychiatry | Admitting: Psychiatry

## 2020-11-08 ENCOUNTER — Telehealth (HOSPITAL_BASED_OUTPATIENT_CLINIC_OR_DEPARTMENT_OTHER): Payer: Self-pay | Admitting: Emergency Medicine

## 2020-11-08 ENCOUNTER — Encounter (HOSPITAL_COMMUNITY): Payer: Self-pay | Admitting: Nurse Practitioner

## 2020-11-08 DIAGNOSIS — Z880 Allergy status to penicillin: Secondary | ICD-10-CM

## 2020-11-08 DIAGNOSIS — F332 Major depressive disorder, recurrent severe without psychotic features: Secondary | ICD-10-CM | POA: Diagnosis present

## 2020-11-08 DIAGNOSIS — Z888 Allergy status to other drugs, medicaments and biological substances status: Secondary | ICD-10-CM

## 2020-11-08 DIAGNOSIS — I10 Essential (primary) hypertension: Secondary | ICD-10-CM | POA: Diagnosis present

## 2020-11-08 DIAGNOSIS — F333 Major depressive disorder, recurrent, severe with psychotic symptoms: Secondary | ICD-10-CM | POA: Diagnosis present

## 2020-11-08 DIAGNOSIS — Z885 Allergy status to narcotic agent status: Secondary | ICD-10-CM | POA: Diagnosis not present

## 2020-11-08 DIAGNOSIS — E039 Hypothyroidism, unspecified: Secondary | ICD-10-CM | POA: Diagnosis present

## 2020-11-08 DIAGNOSIS — Z79899 Other long term (current) drug therapy: Secondary | ICD-10-CM

## 2020-11-08 DIAGNOSIS — Z811 Family history of alcohol abuse and dependence: Secondary | ICD-10-CM

## 2020-11-08 DIAGNOSIS — Z79890 Hormone replacement therapy (postmenopausal): Secondary | ICD-10-CM

## 2020-11-08 DIAGNOSIS — R45851 Suicidal ideations: Secondary | ICD-10-CM | POA: Diagnosis present

## 2020-11-08 DIAGNOSIS — K219 Gastro-esophageal reflux disease without esophagitis: Secondary | ICD-10-CM | POA: Diagnosis present

## 2020-11-08 DIAGNOSIS — R442 Other hallucinations: Secondary | ICD-10-CM | POA: Diagnosis present

## 2020-11-08 DIAGNOSIS — G4700 Insomnia, unspecified: Secondary | ICD-10-CM | POA: Diagnosis present

## 2020-11-08 DIAGNOSIS — Z833 Family history of diabetes mellitus: Secondary | ICD-10-CM | POA: Diagnosis not present

## 2020-11-08 DIAGNOSIS — Z818 Family history of other mental and behavioral disorders: Secondary | ICD-10-CM | POA: Diagnosis not present

## 2020-11-08 DIAGNOSIS — Z823 Family history of stroke: Secondary | ICD-10-CM

## 2020-11-08 DIAGNOSIS — E785 Hyperlipidemia, unspecified: Secondary | ICD-10-CM | POA: Diagnosis present

## 2020-11-08 DIAGNOSIS — Z8249 Family history of ischemic heart disease and other diseases of the circulatory system: Secondary | ICD-10-CM

## 2020-11-08 LAB — LIPID PANEL
Cholesterol: 205 mg/dL — ABNORMAL HIGH (ref 0–200)
HDL: 50 mg/dL (ref >40)
LDL Cholesterol: 137 mg/dL — ABNORMAL HIGH (ref 0–99)
Total CHOL/HDL Ratio: 4.1 RATIO
Triglycerides: 88 mg/dL (ref <150)
VLDL: 18 mg/dL (ref 0–40)

## 2020-11-08 LAB — TSH: TSH: 3.634 u[IU]/mL (ref 0.350–4.500)

## 2020-11-08 LAB — HEMOGLOBIN A1C
Hgb A1c MFr Bld: 5.8 % — ABNORMAL HIGH (ref 4.8–5.6)
Mean Plasma Glucose: 119.76 mg/dL

## 2020-11-08 MED ORDER — AMLODIPINE BESYLATE 5 MG PO TABS
5.0000 mg | ORAL_TABLET | Freq: Every day | ORAL | Status: DC
  Administered 2020-11-08 – 2020-11-10 (×3): 5 mg via ORAL
  Filled 2020-11-08 (×6): qty 1

## 2020-11-08 MED ORDER — EPINEPHRINE 0.3 MG/0.3ML IJ SOAJ
0.3000 mg | Freq: Once | INTRAMUSCULAR | Status: DC | PRN
Start: 2020-11-08 — End: 2020-11-10

## 2020-11-08 MED ORDER — HYDROXYZINE HCL 25 MG PO TABS
25.0000 mg | ORAL_TABLET | ORAL | Status: DC | PRN
  Administered 2020-11-08 – 2020-11-09 (×2): 25 mg via ORAL
  Filled 2020-11-08 (×2): qty 1

## 2020-11-08 MED ORDER — DULOXETINE HCL 30 MG PO CPEP
30.0000 mg | ORAL_CAPSULE | Freq: Every day | ORAL | Status: DC
  Administered 2020-11-08 – 2020-11-09 (×2): 30 mg via ORAL
  Filled 2020-11-08 (×4): qty 1

## 2020-11-08 MED ORDER — DULOXETINE HCL 60 MG PO CPEP
60.0000 mg | ORAL_CAPSULE | Freq: Every day | ORAL | Status: DC
  Administered 2020-11-09 – 2020-11-10 (×2): 60 mg via ORAL
  Filled 2020-11-08 (×3): qty 1

## 2020-11-08 MED ORDER — MAGNESIUM HYDROXIDE 400 MG/5ML PO SUSP: 30.0000 mL | Freq: Every day | ORAL | Status: DC | PRN

## 2020-11-08 MED ORDER — LURASIDONE HCL 40 MG PO TABS
40.0000 mg | ORAL_TABLET | Freq: Every day | ORAL | Status: DC
  Administered 2020-11-08 – 2020-11-09 (×2): 40 mg via ORAL
  Filled 2020-11-08 (×4): qty 1

## 2020-11-08 MED ORDER — LEVOTHYROXINE SODIUM 75 MCG PO TABS
75.0000 ug | ORAL_TABLET | Freq: Every day | ORAL | Status: DC
  Administered 2020-11-08 – 2020-11-10 (×3): 75 ug via ORAL
  Filled 2020-11-08 (×4): qty 1

## 2020-11-08 MED ORDER — PANTOPRAZOLE SODIUM 40 MG PO TBEC
40.0000 mg | DELAYED_RELEASE_TABLET | Freq: Every day | ORAL | Status: DC
  Administered 2020-11-08 – 2020-11-10 (×3): 40 mg via ORAL
  Filled 2020-11-08 (×5): qty 1

## 2020-11-08 MED ORDER — ALUM & MAG HYDROXIDE-SIMETH 200-200-20 MG/5ML PO SUSP: 30.0000 mL | ORAL | Status: DC | PRN

## 2020-11-08 MED ORDER — BUSPIRONE HCL 15 MG PO TABS
15.0000 mg | ORAL_TABLET | Freq: Two times a day (BID) | ORAL | Status: DC
  Administered 2020-11-08 – 2020-11-10 (×5): 15 mg via ORAL
  Filled 2020-11-08 (×2): qty 1
  Filled 2020-11-08: qty 3
  Filled 2020-11-08 (×5): qty 1

## 2020-11-08 MED ORDER — ATORVASTATIN CALCIUM 10 MG PO TABS
10.0000 mg | ORAL_TABLET | Freq: Every day | ORAL | Status: DC
  Administered 2020-11-08 – 2020-11-10 (×3): 10 mg via ORAL
  Filled 2020-11-08 (×5): qty 1

## 2020-11-08 MED ORDER — LISINOPRIL 20 MG PO TABS
20.0000 mg | ORAL_TABLET | Freq: Every day | ORAL | Status: DC
  Administered 2020-11-08 – 2020-11-10 (×3): 20 mg via ORAL
  Filled 2020-11-08 (×5): qty 1

## 2020-11-08 MED ORDER — ACETAMINOPHEN 325 MG PO TABS
650.0000 mg | ORAL_TABLET | Freq: Four times a day (QID) | ORAL | Status: DC | PRN
  Administered 2020-11-09 (×2): 650 mg via ORAL
  Filled 2020-11-08 (×2): qty 2

## 2020-11-08 MED ORDER — DULOXETINE HCL 60 MG PO CPEP
120.0000 mg | ORAL_CAPSULE | Freq: Every day | ORAL | Status: DC
  Administered 2020-11-08: 120 mg via ORAL
  Filled 2020-11-08 (×4): qty 2

## 2020-11-08 NOTE — Progress Notes (Signed)
D:  Patient denied HI.  Denied A/V hallucinations.  SI yesterday to OD on pills.  SI off/on, contracts for safety. A:  Medications administered per MD orders.  Emotional support and encouragement given patient. R:  Safety maintained with 15 minute checks.

## 2020-11-08 NOTE — Progress Notes (Signed)
Recreation Therapy Notes  Date: 1.19.22 Time: 0930 Location: [address] Hall Dayroom  Group Topic: Stress Management  Goal Area(s) Addresses:  Patient will identify positive stress management techniques. Patient will identify benefits of using stress management post d/c.  Behavioral Response: Engaged  Intervention: Stress Management  Activity: Meditation.  LRT played a meditation that focused on calming anxiety.  Patients were to listen and follow along as meditation played to fully engage.    Education:  Stress Management, Discharge Planning.   Education Outcome: Acknowledges Education  Clinical Observations/Feedback: Pt attended and participated in activity.      Victorino Sparrow, LRT/CTRS        Ria Comment, [place] A 11/08/2020 11:13 AM
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
## 2020-11-08 NOTE — BHH Counselor (Signed)
Lindon Romp, NP, patient meets inpatient criteria.  Larose Kells, accepted to [facility].  Attending is Dr. Mallie Darting.  Arrival time is now. Marolyn Hammock, RN, informed of acceptance. Report [phone]

## 2020-11-08 NOTE — Tx Team (Signed)
Initial Treatment Plan 11/08/2020 5:02 AM [name] [id number]    PATIENT STRESSORS: Marital or family conflict Medication change or noncompliance   PATIENT STRENGTHS: General fund of knowledge Motivation for treatment/growth   PATIENT IDENTIFIED PROBLEMS:  risk for suicide  depression  "Medication adjustment , coping skills"                 DISCHARGE CRITERIA:  Improved stabilization in mood, thinking, and/or behavior Verbal commitment to aftercare and medication compliance  PRELIMINARY DISCHARGE PLAN: Attend aftercare/continuing care group Outpatient therapy  PATIENT/FAMILY INVOLVEMENT: This treatment plan has been presented to and reviewed with the patient, Shannon Santiago.  The patient and family have been given the opportunity to ask questions and make suggestions.  Providence Crosby, RN 11/08/2020, 5:02 AM

## 2020-11-08 NOTE — [facility] Group Notes (Signed)
LCSW Group Therapy Note  Type of Therapy/Topic: Group Therapy: Six Dimensions of Wellness  Participation Level: CSW provided worksheet to patient due to COVID. CSW offered to meet individually with patient as needed.   Description of Group:  This group will address the concept of wellness and the six concepts of wellness: occupational, physical, social, intellectual, spiritual, and emotional. Patients will be encouraged to process areas in their lives that are out of balance and identify reasons for remaining unbalanced. Patients will be encouraged to explore ways to practice healthy habits daily to attain better physical and mental health outcomes.  Therapeutic Goals:  1. Identify aspects of wellness that they are doing well.  2. Identify aspects of wellness that they would like to improve upon.  3. Identify one action they can take to improve an aspect of wellness in their lives.  Summary of Patient Progress:  Therapeutic Modalities: Cognitive Behavioral Therapy Solution-Focused Therapy Relapse Prevention  Toney Reil, [place]

## 2020-11-08 NOTE — H&P (Signed)
Psychiatric Admission Assessment Adult  Patient Identification: Shannon Santiago  MRN:  [id number]  Date of Evaluation:  11/08/2020  Chief Complaint: Worsening symptoms of depression triggering suicidal ideations without plans or intent.  Principal Diagnosis: MDD (major depressive disorder), recurrent, severe, with psychosis ([place])  Diagnosis:  Principal Problem:   MDD (major depressive disorder), recurrent, severe, with psychosis ([place])  History of Present Illness: This is the first psychiatric admission in this [facility] for this 55 year old Caucasian female with long hx of depression since the age of 54.  She is admitted to the [facility] with complaints of worsening symptoms of depression triggering suicidal ideations without plans or intent. Prior to this hospitalization, patient was apparently receiving mental health treatment on an outpatient basis by her primary care provider Dr. Sheppard Coil. She reports during this admission evaluation that she was once receiving mental health treatment in a partial hospital treatment program at the [facility]. Reports indicated that she has tried multiple psychotropic medications without success. She is here for mood stabilization treatments. During this evaluation with Dr. Nelda Marseille presents, Shannon Santiago reports,  "I asked my father to take me to the [facility] yesterday because I was having suicidal thoughts. I have had suicidal thoughts most of my adult life, off & on, but has never attempted to hurt myself. However, I know I have had symptoms of depression since age 89, but was never treated for depression until my adult years. I actually did not know what I was dealing with at age 53 was depression. I thought that was how I should feel then. I cannot pinpoint the exact cause of my depression, but I know our parents fought like cats & dogs in front of [place] children. I can say that that was one of the reason why I'm depressed like I'm over the years. I know  depression, Schizophrenia, alcoholism & Bipolar disorder run deep in my mother's side of the family. Since starting treatment for depression, I have felt better sparingly, but never a time I can says I was not depressed. I have tried a lot of medications that did not help me. I have felt better on the combination of Wellbutrin & Cymbalta, but had tremors with Wellbutrin & that has to be stopped. I'm currently on Cymbalta 120 mg, then Abilify was added, made me too sleepy & foggy headed during the day at work. I was instructed to switch it to night time with no relief of the grogginess. And with the worsening depression, I was also having olfactory hallucinations. I was smelling cigarette smoke. I know my mother smoked heavily, but she has been dead from lung cancer 6 years ago. I was also hearing some voices during my wake hours in the morning. And with these symptoms of depression, I noticed that I lacked motivation & have lost interest in doing pleasurable things".  Patient denies any delusional thinking or paranoia.  Associated Signs/Symptoms:  Depression Symptoms:  depressed mood, anhedonia, insomnia, hopelessness, suicidal thoughts without plan,  Duration of Depression Symptoms: Less than two weeks  (Hypo) Manic Symptoms:  Distractibility, Labiality of Mood,  Anxiety Symptoms:  Excessive Worry,  Psychotic Symptoms:  Hallucinations: Auditory Olfactory  Duration of Psychotic Symptoms: No data recorded  PTSD Symptoms: NA  Total Time spent with patient: 1 hour  Past Psychiatric History: Major depressive disorder, recurrent episodes.  Is the patient at risk to self? No.  Has the patient been a risk to self in the past 6 months? Yes.  Has the patient been a risk to self within the distant past? Yes.    Is the patient a risk to others? No.  Has the patient been a risk to others in the past 6 months? No.  Has the patient been a risk to others within the distant past? No.   Prior  Inpatient Therapy: Patient denies Prior Outpatient Therapy: Yes (Partial hospital with [facility] previously, currently being treatted by her PCP, Dr. Sheppard Coil.  Alcohol Screening: 1. How often do you have a drink containing alcohol?: 2 to 3 times a week 2. How many drinks containing alcohol do you have on a typical day when you are drinking?: 1 or 2 3. How often do you have six or more drinks on one occasion?: Never AUDIT-C Score: 3 4. How often during the last year have you found that you were not able to stop drinking once you had started?: Never 5. How often during the last year have you failed to do what was normally expected from you because of drinking?: Never 6. How often during the last year have you needed a first drink in the morning to get yourself going after a heavy drinking session?: Never 7. How often during the last year have you had a feeling of guilt of remorse after drinking?: Never 8. How often during the last year have you been unable to remember what happened the night before because you had been drinking?: Never 9. Have you or someone else been injured as a result of your drinking?: No 10. Has a relative or friend or a doctor or another health worker been concerned about your drinking or suggested you cut down?: No Alcohol Use Disorder Identification Test Final Score (AUDIT): 3  Substance Abuse History in the last 12 months:  No.  Consequences of Substance Abuse: NA  Previous Psychotropic Medications: Yes, Wellbutrin, Cymabalta, Prozac, Celexa, Zoloft, Effexor, Abilify  Psychological Evaluations: No   Past Medical History:  Past Medical History:  Diagnosis Date  . Allergy   . Anxiety   . Asthma   . Depression   . Family history of breast cancer   . Family history of colon cancer   . Family history of lung cancer   . Family history of prostate cancer   . Hyperlipidemia   . Hypertension   . Hypothyroidism   . Prolonged PTT (partial thromboplastin time)  06/23/2019   Normal PT  . Thyroid disease     Past Surgical History:  Procedure Laterality Date  . ENDOMETRIAL BIOPSY  06/21/2019  . EYE SURGERY     x 5  . HYSTEROSCOPY WITH D & C N/A 10/27/2019   Procedure: DILATATION AND CURETTAGE /HYSTEROSCOPY;  Surgeon: Emily Filbert, MD;  Location: [facility];  Service: Gynecology;  Laterality: N/A;   Family History:  Family History  Problem Relation Age of Onset  . Hypertension Mother   . Stroke Mother   . Lung cancer Mother 22  . Anxiety disorder Mother   . Depression Mother   . Alcohol abuse Mother   . Skin cancer Father   . Prostate cancer Father 46  . Hypertension Father   . Diabetes Father   . Thyroid disease Maternal Aunt   . Thyroid disease Maternal Uncle   . Diabetes Brother   . Hypertension Brother   . Colon cancer Maternal Grandmother        dx 40s, d. 2s  . Prostate cancer Paternal Grandfather 77  . Breast cancer Cousin  dx 20s-30s   Family Psychiatric  History: Maternal side of the family: Alcoholism, Depression, Schizophrenia, Bipolar disorder.   Tobacco Screening: Have you used any form of tobacco in the last 30 days? (Cigarettes, Smokeless Tobacco, Cigars, and/or Pipes): No  Social History:  Social History   Substance and Sexual Activity  Alcohol Use Yes   Comment: occ     Social History   Substance and Sexual Activity  Drug Use Never    Additional Social History:  Allergies:   Allergies  Allergen Reactions  . Morphine And Related Other (See Comments)    Numb hands and feet  . Peanut Oil Other (See Comments)    Stomach cramping, hand/foot swelling  . Penicillins Hives  . Polymyxin B-Trimethoprim Other (See Comments), Photosensitivity and Swelling  . Citalopram Other (See Comments)    SSRI's ineffective - Citalopram, Fluoxetine, Sertraline, Excitalopram   . Food Nausea Only, Swelling, Palpitations, Other (See Comments) and Cough    WHEAT  . Prednisone Anxiety, Nausea Only, Other  (See Comments) and Palpitations  . Singulair [Montelukast Sodium] Other (See Comments)    Mood changes, worsening depression   Lab Results: No results found for this or any previous visit (from the past 37 hour(s)).  Blood Alcohol level:  Lab Results  Component Value Date   ETH <10 123456   Metabolic Disorder Labs:  Lab Results  Component Value Date   HGBA1C 6.0 (H) 08/11/2020   MPG 126 08/11/2020   Lab Results  Component Value Date   PROLACTIN 10.6 06/18/2019   Lab Results  Component Value Date   CHOL 276 (H) 08/11/2020   TRIG 184 (H) 08/11/2020   HDL 44 (L) 08/11/2020   CHOLHDL 6.3 (H) 08/11/2020   LDLCALC 196 (H) 08/11/2020   LDLCALC 205 (H) 09/09/2019   Current Medications: Current Facility-Administered Medications  Medication Dose Route Frequency Provider Last Rate Last Admin  . acetaminophen (TYLENOL) tablet 650 mg  650 mg Oral Q6H PRN Lindon Romp A, NP      . alum & mag hydroxide-simeth (MAALOX/MYLANTA) 200-200-20 MG/5ML suspension 30 mL  30 mL Oral Q4H PRN Lindon Romp A, NP      . amLODipine (NORVASC) tablet 5 mg  5 mg Oral Daily Lindon Romp A, NP   5 mg at 11/08/20 0752  . atorvastatin (LIPITOR) tablet 10 mg  10 mg Oral Daily Lindon Romp A, NP   10 mg at 11/08/20 0752  . busPIRone (BUSPAR) tablet 15 mg  15 mg Oral BID Lindon Romp A, NP   15 mg at 11/08/20 0751  . DULoxetine (CYMBALTA) DR capsule 120 mg  120 mg Oral Daily Lindon Romp A, NP   120 mg at 11/08/20 0751  . EPINEPHrine (EPI-PEN) injection 0.3 mg  0.3 mg Intramuscular Once PRN Lindon Romp A, NP      . levothyroxine (SYNTHROID) tablet 75 mcg  75 mcg Oral Q0600 Lindon Romp A, NP   75 mcg at 11/08/20 0925  . lisinopril (ZESTRIL) tablet 20 mg  20 mg Oral Daily Lindon Romp A, NP   20 mg at 11/08/20 0751  . magnesium hydroxide (MILK OF MAGNESIA) suspension 30 mL  30 mL Oral Daily PRN Lindon Romp A, NP      . pantoprazole (PROTONIX) EC tablet 40 mg  40 mg Oral Daily Lindon Romp A, NP   40 mg at  11/08/20 0751   PTA Medications: Medications Prior to Admission  Medication Sig Dispense Refill Last Dose  . Albuterol Sulfate (  PROAIR RESPICLICK) 630 (90 Base) MCG/ACT AEPB Inhale 1-2 puffs into the lungs every 4 (four) hours as needed. 1 each 4 Past Month at Unknown time  . ALPRAZolam (XANAX) 0.5 MG tablet Take 0.5-1 tablets (0.25-0.5 mg total) by mouth 2 (two) times daily as needed for anxiety. 15 tablet 0 Past Month at Unknown time  . amLODipine (NORVASC) 5 MG tablet TAKE 1 TABLET BY MOUTH ONCE DAILY 90 tablet 1 Past Week at Unknown time  . ARIPiprazole (ABILIFY) 10 MG tablet Take 1 tablet (10 mg total) by mouth daily. 90 tablet 1 Past Week at Unknown time  . atorvastatin (LIPITOR) 10 MG tablet Take 1 tablet (10 mg total) by mouth daily. 90 tablet 3 Past Week at Unknown time  . busPIRone (BUSPAR) 15 MG tablet Take 1 tablet (15 mg total) by mouth 2 (two) times daily. 180 tablet 3 Past Week at Unknown time  . CALCIUM PO Take 500 mg by mouth daily.   Past Week at Unknown time  . cetirizine (ZYRTEC) 10 MG tablet Take 10 mg by mouth daily.   Past Week at Unknown time  . Cholecalciferol (VITAMIN D3) 10 MCG (400 UNIT) tablet Take 400 Units by mouth daily.   Past Week at Unknown time  . Ferrous Sulfate (IRON PO) Take 325 mg by mouth daily.   Past Week at Unknown time  . Ginger, Zingiber officinalis, (GINGER ROOT PO) Take by mouth.   Past Week at Unknown time  . Magnesium 500 MG CAPS Take 500 mg by mouth daily.   Past Week at Unknown time  . Multiple Vitamin (MULTIVITAMIN) capsule Take 1 capsule by mouth daily.   Past Week at Unknown time  . Omega-3 Fatty Acids (FISH OIL) 1000 MG CAPS Take 1 capsule by mouth 2 (two) times daily.   Past Week at Unknown time  . Probiotic Product (PROBIOTIC ADVANCED PO) Take 1 capsule by mouth daily.   Past Week at Unknown time  . RESTASIS 0.05 % ophthalmic emulsion 1 drop 2 (two) times daily.   Past Week at Unknown time  . Ascorbic Acid (VITAMIN C) 1000 MG tablet Take  1,000 mg by mouth daily.     Marland Kitchen b complex vitamins tablet Take 1 tablet by mouth daily.     . DULoxetine (CYMBALTA) 60 MG capsule Take 2 capsules (120 mg total) by mouth daily. (Patient taking differently: Take 60 mg by mouth 2 (two) times daily.) 180 capsule 3   . EPINEPHrine 0.3 mg/0.3 mL IJ SOAJ injection Inject 0.3 mLs (0.3 mg total) into the muscle once as needed (anaphylaxis/allergic reaction). 1 each 3   . fluticasone (FLONASE) 50 MCG/ACT nasal spray Place 1 spray into both nostrils daily. 16 g 5   . Fluticasone-Salmeterol (ADVAIR DISKUS) 250-50 MCG/DOSE AEPB Inhale 1 puff into the lungs 2 (two) times daily. 60 each 3   . levothyroxine (SYNTHROID) 75 MCG tablet Take 1 tablet (75 mcg total) by mouth daily before breakfast. Will be due for labs around end of December 2021, early January 2022 90 tablet 0   . lisinopril (ZESTRIL) 20 MG tablet TAKE 1 TABLET BY MOUTH ONCE DAILY 90 tablet 1   . omeprazole (PRILOSEC) 40 MG capsule Take 1 capsule (40 mg total) by mouth daily. 30 capsule 2     Musculoskeletal: Strength & Muscle Tone: within normal limits Gait & Station: normal Patient leans: N/A  Psychiatric Specialty Exam: Physical Exam Vitals and nursing note reviewed.  HENT:     Head: Normocephalic.  Nose: Nose normal.     Mouth/Throat:     Pharynx: Oropharynx is clear.  Eyes:     Pupils: Pupils are equal, round, and reactive to light.  Cardiovascular:     Rate and Rhythm: Normal rate.     Pulses: Normal pulses.  Pulmonary:     Effort: Pulmonary effort is normal.  Genitourinary:    Comments: Deferred Musculoskeletal:        General: Normal range of motion.     Cervical back: Normal range of motion.  Skin:    General: Skin is warm and dry.  Neurological:     General: No focal deficit present.     Mental Status: She is alert and oriented to person, place, and time.     Review of Systems  Constitutional: Negative for chills, diaphoresis and fever.  HENT: Negative for  congestion, rhinorrhea, sneezing and sore throat.   Eyes: Negative for discharge.  Respiratory: Negative for cough, chest tightness, shortness of breath and wheezing.   Cardiovascular: Negative for chest pain and palpitations.  Gastrointestinal: Negative for diarrhea, nausea and vomiting.  Endocrine: Negative for cold intolerance.  Genitourinary: Negative for difficulty urinating.  Musculoskeletal: Negative for arthralgias and myalgias.  Skin: Negative.   Allergic/Immunologic: Positive for food allergies ( Peanut, Wheat). Negative for environmental allergies and immunocompromised state.       Allergies: Morphine, PCN, Citalopram, Polymyxin B-trimethopirm, Prednisone, Singulair, Prednisone  Food: Peanut, Wheat.  Neurological: Negative for dizziness, tremors (Hx of (Wellbutrin induced).), syncope, facial asymmetry, speech difficulty, weakness, light-headedness, numbness and headaches.  Psychiatric/Behavioral: Positive for decreased concentration, dysphoric mood, hallucinations (Olfactory, auditory), sleep disturbance and suicidal ideas (Denies any attempt.). Negative for agitation, behavioral problems, confusion and self-injury. The patient is nervous/anxious. The patient is not hyperactive.     Blood pressure (!) 155/101, pulse 85, temperature 98.2 F (36.8 C), temperature source Oral, resp. rate 16, height 5\' 1"  (1.549 m), weight 85.3 kg, last menstrual period 06/18/2017, SpO2 97 %.Body mass index is 35.52 kg/m.  General Appearance: Casual, in a hospital scrub   Eye Contact:  Good  Speech:  Clear and Coherent and Normal Rate  Volume:  Normal  Mood:  Depressed and Dysphoric  Affect:  Depressed and Flat  Thought Process:  Coherent and Descriptions of Associations: Intact  Orientation:  Full (Time, Place, and Person)  Thought Content:  Hallucinations: Auditory Olfactory and Rumination  Suicidal Thoughts:  Yes.  without intent/plan  Homicidal Thoughts:  Denies any thoughts, plans or  intent  Memory:  Immediate;   Good Recent;   Good Remote;   Good  Judgement:  Fair  Insight:  Present  Psychomotor Activity:  Normal  Concentration: Good  Recall:  Good  Fund of Knowledge:  Fair  Language:  Good  Akathisia:  Negative  Handed:  Right  AIMS (if indicated):     Assets:  Communication Skills Desire for Improvement Financial Resources/Insurance Resilience  ADL's:  Intact  Cognition:  WNL  Sleep: New admit.   Treatment Plan Summary: Daily contact with patient to assess and evaluate symptoms and progress in treatment and Medication management.  Treatment Plan/Recommendations:  1. Admit for crisis management and stabilization, estimated length of stay 3-5 days.    2. Medication management to reduce current symptoms to base line and improve the patient's overall level of functioning: See MAR, Md's SRA & treatment plan.   Observation Level/Precautions:  15 minute checks  Laboratory:  Per ED, current lab reviewed. We will obatin, Lipid panel, Hgba1c,  Prolactin level, & TSH,   Psychotherapy: Group sessions   Medications: See MAR   Consultations: As needed.   Discharge Concerns: Safety, mood stability.  Estimated LOS: 3-5 days  Other: Admit to the [facility].     Physician Treatment Plan for Primary Diagnosis: MDD (major depressive disorder), recurrent, severe, with psychosis ([place]) Long Term Goal(s): Improvement in symptoms so as ready for discharge  Short Term Goals: Ability to identify changes in lifestyle to reduce recurrence of condition will improve, Ability to verbalize feelings will improve and Ability to disclose and discuss suicidal ideas  Physician Treatment Plan for Secondary Diagnosis: Principal Problem:   MDD (major depressive disorder), recurrent, severe, with psychosis ([place])  Long Term Goal(s): Improvement in symptoms so as ready for discharge  Short Term Goals: Ability to demonstrate self-control will improve, Ability to identify and develop  effective coping behaviors will improve and Compliance with prescribed medications will improve  I certify that inpatient services furnished can reasonably be expected to improve the patient's condition.    Lindell Spar, NP, PMHNP, FNP-BC 1/19/20223:35 PM

## 2020-11-08 NOTE — ED Notes (Signed)
Attempted to call pt father to update, no answer; left message with phone number and instructions to call back; [facility] says that the pt will be able to call when she arrives at the facilities

## 2020-11-08 NOTE — Progress Notes (Signed)
Admission Note:  55 yr female who presents [facility] in no acute distress for the treatment of SI and Depression. Pt appears flat and depressed. Pt was calm and cooperative with admission process. Pt presents with passive SI and contracts for safety upon admission. Pt denies AVH . Pt stated she has olfactory hallucinations- smells cigarette smoke of deceased mother. Pt stated she has been depressed since her teen years and nothing has ever taken the depression away, but some medications helped some. Pt has received both shots of COVID-Vaccine, but would like to see if she could get the booster here. Pt stated this is her first in-pt admission. Pt has peanut , dairy, wheat food allergies. Pt informed to notify the doctor if she may need to get an order for salads (as snacks) if she can not find suitable snacks due to her allergies.   Per assessment:  SI with plan to overdose on prescription medications. Patient has prescription medications in the home. Patient reported onset of this SI episode was 3 days ago. However patient reported SI her entire life. Patient reported worsening depressive symptoms. Patient unable to identify stressors/triggers of SI with plan.  Patient is currently seeing therapist Hubbard Robinson bi weekly with next appt on tomorrow night. Patient reported only main support system is her therapist. Patient is currently receiving psych medications from Dr. Sheppard Coil, patients PCP. PCP recently did take her off Abilify. Patient is still on her other medications including Cymbalta, BuSpar, Wellbutrin, patient states that she has been compliant with these. Patient reported trying to handle this with her PCP, however for the past couple of days symptoms have been the worst that they have been. Patient resides with father. Patient is currently employed and reported work-related stressors with deadlines. Patient reported access to guns.  A:Skin was assessed and found to be clear of any abnormal marks  apart from a redden area L-chest, multiple deep cyst acne. PT searched and no contraband found, POC and unit policies explained and understanding verbalized. Consents obtained. Food and fluids offered, and fluids accepted.   R:Pt had no additional questions or concerns.
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
## 2020-11-08 NOTE — ED Notes (Signed)
Spoke to [place] from [facility]; pt accepted to [facility] room 306-1; accepting is Dr Mallie Darting

## 2020-11-08 NOTE — Plan of Care (Signed)
Nurse discussed anxiety, depression and coping skills with patient.  

## 2020-11-08 NOTE — BHH Suicide Risk Assessment (Addendum)
[facility] Admission Suicide Risk Assessment   Nursing information obtained from:  Patient Demographic factors:  Caucasian, single Current Mental Status:  Suicidal ideation indicated by patient,Suicide plan Loss Factors:  Death of mother in 23 Historical Factors:  Family history of mental illness or substance abuse Risk Reduction Factors:  Sense of responsibility to family, employed  Total Time Spent in Direct Patient Care:  I personally spent 50 minutes on the unit in direct patient care. The direct patient care time included face-to-face time with the patient, reviewing the patient's chart, communicating with other professionals, and coordinating care. Greater than 50% of this time was spent in counseling or coordinating care with the patient regarding goals of hospitalization, psycho-education, and discharge planning needs.  Principal Problem: MDD (major depressive disorder), recurrent, severe, with psychosis ([place]) Diagnosis:  Principal Problem:   MDD (major depressive disorder), recurrent, severe, with psychosis ([place])  Subjective Data: Patient reports a long-standing h/o treatment resistant MDD starting as an adolescent. She states she has not responded to previous trials of Paxil, Prozac, Zoloft, Celexa, Lexapro, and Effexor in addition to other antidepressants she cannot recall. She first sought help with her depression in her 26s. Most recently she has been on Cymbalta 120mg  for the last year and she reports that her PCP added Wellbutrin XL 150mg  1 month ago to her regimen, but she had to stop Wellbutrin due to tremors. She reports that Wellbutrin as a single agent also caused her to have tremors previously. Her PCP added Abilify 10mg  to her regimen 3 weeks ago but she was feeling too sedated and foggy on the medication, even when the medication was switched to qhs dosing. She does not recall augmentation trials with other antipsychotics, Li, or anti-epileptic medications in the past. She reports  that she asked her father to take her to the ED yesterday when she started having suicidal thoughts. She states she was worried she would act on her SI but cannot articulate a specific plan. She denies previous suicide attempts or hospital admissions in the past but states she did attend a PHP in 2014 at [place]. Her PCP has been managing her psychotropic medications and she has a referal pending for an outpatient psychiatrist. In terms of her depression, she reports general anhedonia, and chronic dysthymia with low mood persisting for years. Recently she has had increased appetite, poor focus, and erratic sleep cycles. She has had to force herself to go to work and finds herself distracted on the job. She states her mother was a chronic smoker, and she has been having OH of smelling cigarette smoke periodically for the last 6 months. She states that occasionally when awakening she will have times she thinks she hears her father calling out for her help but denies other AH. She denies paranoia, delusions, ideas of reference, or first rank symptoms. She states her PCP has questioned if she could have some bipolar tendencies given the refractory nature of her symptoms. She admits she has periods lasting 2-3 days where she will only need 2-3 hours of sleep, is more talkative, and spends more. She denies periods of grandiosity, impulsivity, hyper-sexuality, racing thoughts, burst of energy, or life impairment due to mood elevation. In terms of medical health issues, she states she is treated for Hashimoto's thyroiditis, HTN, pre-DM, and high cholesterol. She denies illicit substance use and states she drinks 2 glasses of wine on average per week.  Continued Clinical Symptoms:  Alcohol Use Disorder Identification Test Final Score (AUDIT): 3 The "Alcohol  Use Disorders Identification Test", Guidelines for Use in Primary Care, Second Edition.  World Pharmacologist [facility]). Score between 0-7:  no or low risk or  alcohol related problems. Score between 8-15:  moderate risk of alcohol related problems. Score between 16-19:  high risk of alcohol related problems. Score 20 or above:  warrants further diagnostic evaluation for alcohol dependence and treatment.  CLINICAL FACTORS:   Depression:   Anhedonia Hopelessness, poor focus, erratic sleep, increased appetite  Musculoskeletal: Strength & Muscle Tone: within normal limits Gait & Station: normal, steady Patient leans: N/A  Psychiatric Specialty Exam: Physical Exam Vitals reviewed.  HENT:     Head: Normocephalic.  Pulmonary:     Effort: Pulmonary effort is normal.  Neurological:     Mental Status: She is alert.     Review of Systems  Constitutional: Positive for appetite change.  Psychiatric/Behavioral: Positive for decreased concentration, dysphoric mood, hallucinations, sleep disturbance and suicidal ideas.    Blood pressure (!) 155/101, pulse 85, temperature 98.2 F (36.8 C), temperature source Oral, resp. rate 16, height 5\' 1"  (1.549 m), weight 85.3 kg, last menstrual period 06/18/2017, SpO2 97 %.Body mass index is 35.52 kg/m.  General Appearance: casually dressed in scrubs, adequate hygiene, well engaged with examiner  Eye Contact:  Good  Speech:  Normal Rate and normal fluency  Volume:  Normal  Mood:  Dysphoric  Affect:  Constricted  Thought Process:  Goal Directed and Linear  Orientation:  Full (Time, Place, and Person)  Thought Content:  Reports intermittent OH of smelling smoke and intermittent AH of hearing her father call for her; no evidence of response to internal/external stimuli on exam; no evidence of delusions, paranoia, obsessions/compulsions  Suicidal Thoughts:  Suicidal thinking on admission - can contract for safety on the unit  Homicidal Thoughts:  No  Memory:  Recent;   Good  Judgement:  Good  Insight:  Good  Psychomotor Activity:  Normal  Concentration:  Concentration: Good and Attention Span: Good  Recall:   Good  Fund of Knowledge:  Good  Language:  Good  Akathisia:  Negative  Assets:  Communication Skills Desire for Improvement Financial Resources/Insurance Housing Resilience Social Support  ADL's:  Intact  Cognition:  WNL   COGNITIVE FEATURES THAT CONTRIBUTE TO RISK:  None    SUICIDE RISK:   Moderate:  Frequent suicidal ideation with limited intensity, and duration, some specificity in terms of plans, no associated intent, good self-control, limited dysphoria/symptomatology, some risk factors present, and identifiable protective factors, including available and accessible social support.  PLAN OF CARE: The patient will be integrated into the milieu and encouraged to participate in groups. We discussed medication options and after extensive discussion she is willing to reduce her Cymbalta to 90mg  from 120mg . She has not found benefit at the higher dose despite being on the medication for a year, and I advised that she may need to consider cross-taper onto a new antidepressant. Given her thyroid issues, Lithium is not presently an option as an augmentation agent for SI or mood. She has some vague AH and OH suggestive of a MDD severe episode with psychotic features. She has not been able to tolerate the Abilify due to oversedation, and we discussed other antipsychotic augmentation options including Seroquel or Rexulti for MDD and for treatment of psychotic features. I advised that I am concerned that Seroquel could also cause oversedation. I spoke with PharmD for the team about Rexulti and there is concern that if she could not tolerate  Abilify that Rexulti may cause similar side-effects. After discussion, the patient was willing to try Latuda augmentation which should be less sedating. I advised that in the event she has an undiagnosed bipolar II diagnosis, that Latuda also has an indication to treat bipolar depression. The r/b/se/a of antipsychotics were discussed including the risk of developing  TD/EPS, DM, worsening cholesterol, weight gain, blood dyscrasias, and EKG changes and she verbalized understanding of potential risks and agrees to med trial. We will recheck lipids, check HbgA1c, and EKG for monitoring with start of Latuda and will check TSH. Her admission labs were reviewed: CMP unremarkable except for glucose 130 and BUN 23; CBC unremarkable except for WBC 11.8; Tylenol <10, Preg test negative, ETOH <10, UDS negative.  I certify that inpatient services furnished can reasonably be expected to improve the patient's condition.   Harlow Asa, MD, FAPA 11/08/2020, 3:30 PM

## 2020-11-08 NOTE — Progress Notes (Signed)
The patient rated her day as a 5 out of 10 since she felt "low" earlier in the day. Her goal for tomorrow is to attend more of the groups and learn some coping skills.

## 2020-11-09 LAB — CBC WITH DIFFERENTIAL/PLATELET
Abs Immature Granulocytes: 0.03 10*3/uL (ref 0.00–0.07)
Basophils Absolute: 0.1 10*3/uL (ref 0.0–0.1)
Basophils Relative: 1 %
Eosinophils Absolute: 0.2 10*3/uL (ref 0.0–0.5)
Eosinophils Relative: 2 %
HCT: 45.8 % (ref 36.0–46.0)
Hemoglobin: 15.1 g/dL — ABNORMAL HIGH (ref 12.0–15.0)
Immature Granulocytes: 0 %
Lymphocytes Relative: 29 %
Lymphs Abs: 2.7 10*3/uL (ref 0.7–4.0)
MCH: 32.5 pg (ref 26.0–34.0)
MCHC: 33 g/dL (ref 30.0–36.0)
MCV: 98.7 fL (ref 80.0–100.0)
Monocytes Absolute: 0.8 10*3/uL (ref 0.1–1.0)
Monocytes Relative: 9 %
Neutro Abs: 5.4 10*3/uL (ref 1.7–7.7)
Neutrophils Relative %: 59 %
Platelets: 434 10*3/uL — ABNORMAL HIGH (ref 150–400)
RBC: 4.64 MIL/uL (ref 3.87–5.11)
RDW: 13.2 % (ref 11.5–15.5)
WBC: 9.2 10*3/uL (ref 4.0–10.5)
nRBC: 0 % (ref 0.0–0.2)

## 2020-11-09 MED ORDER — TRAZODONE HCL 50 MG PO TABS
50.0000 mg | ORAL_TABLET | Freq: Every day | ORAL | Status: DC
  Administered 2020-11-09: 50 mg via ORAL
  Filled 2020-11-09 (×3): qty 1

## 2020-11-09 NOTE — Progress Notes (Signed)
   11/09/20 1100  Psych Admission Type (Psych Patients Only)  Admission Status Voluntary  Psychosocial Assessment  Patient Complaints Anxiety;Depression  Eye Contact Fair  Facial Expression Sad  Affect Appropriate to circumstance  Speech Logical/coherent  Interaction Assertive  Motor Activity Other (Comment) (wdl)  Appearance/Hygiene Unremarkable;In scrubs  Behavior Characteristics Cooperative  Mood Depressed  Thought Process  Coherency WDL  Content WDL  Delusions None reported or observed  Perception WDL  Hallucination None reported or observed  Judgment WDL  Danger to Self  Current suicidal ideation? Denies  Danger to Others  Danger to Others None reported or observed  [name] has been visible on the unit.  Interacting with staff mainly.  Self inventory completed and reports depression 4/10, hopelessness 3/10 and anxiety 4/10.  Goal for today is "attending groups; coping skills.  Q 15 minute checks maintained for safety.  Encouraged continued participation in group and unit activities.

## 2020-11-09 NOTE — Progress Notes (Signed)
Pt presents pleasant, denied SI/HI/AVH or self harm thoughts at this time. Stated part of the reason she felt suicidal prior to admission is lack of social connections. She reports that she feels much better and less anxious now that she is around people. PRN hydroxyzine administered for insomnia with good effect. No falls or unsafe behavior noted thus far. Q15 minute observations maintained for safety and support provided as needed.   11/08/20 2100  Psych Admission Type (Psych Patients Only)  Admission Status Voluntary  Psychosocial Assessment  Patient Complaints Anxiety;Depression  Eye Contact Brief  Facial Expression Sad;Anxious  Affect Appropriate to circumstance  Speech Logical/coherent  Interaction Assertive  Motor Activity Slow  Appearance/Hygiene In scrubs  Behavior Characteristics Cooperative;Anxious  Mood Depressed;Anxious  Thought Process  Coherency WDL  Content WDL  Delusions None reported or observed  Perception WDL  Hallucination None reported or observed  Judgment Poor  Confusion WDL  Danger to Self  Current suicidal ideation? Passive  Self-Injurious Behavior No self-injurious ideation or behavior indicators observed or expressed   Agreement Not to Harm Self Yes  Description of Agreement Verbally contracted for safety  Danger to Others  Danger to Others None reported or observed

## 2020-11-09 NOTE — Progress Notes (Signed)
[facility] MD Progress Note  11/09/2020 3:37 PM DENISS FREUNDLICH  MRN:  [id number] Subjective:  Patient reports that she is doing very well this AM. Patient reports that she no longer has that "groggy" feeling that she had with Abilify. Patient reports that she spoke with her family on the phone and they also thought she sounded better. Patient reports that she still is not sleeping well but she believes this is more to to discomfort as she normally sleeps in a hammock at home. Patient reports that her appetite is good and she has not had any olfactory hallucinations since admissions. Patient does not endorse SI, HI, nor AVH. Patient reports that she is looking forward to going into the dayroom today.  Principal Problem: MDD (major depressive disorder), recurrent, severe, with psychosis ([place]) Diagnosis: Principal Problem:   MDD (major depressive disorder), recurrent, severe, with psychosis ([place])  Total Time spent with patient: 15 minutes  Past Psychiatric History: See H&P  Past Medical History:  Past Medical History:  Diagnosis Date  . Allergy   . Anxiety   . Asthma   . Depression   . Family history of breast cancer   . Family history of colon cancer   . Family history of lung cancer   . Family history of prostate cancer   . Hyperlipidemia   . Hypertension   . Hypothyroidism   . Prolonged PTT (partial thromboplastin time) 06/23/2019   Normal PT  . Thyroid disease     Past Surgical History:  Procedure Laterality Date  . ENDOMETRIAL BIOPSY  06/21/2019  . EYE SURGERY     x 5  . HYSTEROSCOPY WITH D & C N/A 10/27/2019   Procedure: DILATATION AND CURETTAGE /HYSTEROSCOPY;  Surgeon: Emily Filbert, MD;  Location: [facility];  Service: Gynecology;  Laterality: N/A;   Family History:  Family History  Problem Relation Age of Onset  . Hypertension Mother   . Stroke Mother   . Lung cancer Mother 41  . Anxiety disorder Mother   . Depression Mother   . Alcohol abuse Mother   . Skin  cancer Father   . Prostate cancer Father 59  . Hypertension Father   . Diabetes Father   . Thyroid disease Maternal Aunt   . Thyroid disease Maternal Uncle   . Diabetes Brother   . Hypertension Brother   . Colon cancer Maternal Grandmother        dx 43s, d. 52s  . Prostate cancer Paternal Grandfather 36  . Breast cancer Cousin        dx 23s-30s   Family Psychiatric  History: See H&P Social History:  Social History   Substance and Sexual Activity  Alcohol Use Yes   Comment: occ     Social History   Substance and Sexual Activity  Drug Use Never    Social History   Socioeconomic History  . Marital status: Single    Spouse name: Not on file  . Number of children: Not on file  . Years of education: Not on file  . Highest education level: Not on file  Occupational History  . Not on file  Tobacco Use  . Smoking status: Never Smoker  . Smokeless tobacco: Never Used  Vaping Use  . Vaping Use: Never used  Substance and Sexual Activity  . Alcohol use: Yes    Comment: occ  . Drug use: Never  . Sexual activity: Not Currently    Birth control/protection: Abstinence  Other Topics  Concern  . Not on file  Social History Narrative  . Not on file   Social Determinants of Health   Financial Resource Strain: Not on file  Food Insecurity: Not on file  Transportation Needs: Not on file  Physical Activity: Not on file  Stress: Not on file  Social Connections: Not on file   Additional Social History:                         Sleep: Poor  Appetite:  Good  Current Medications: Current Facility-Administered Medications  Medication Dose Route Frequency Provider Last Rate Last Admin  . acetaminophen (TYLENOL) tablet 650 mg  650 mg Oral Q6H PRN Lindon Romp A, NP   650 mg at 11/09/20 7062  . alum & mag hydroxide-simeth (MAALOX/MYLANTA) 200-200-20 MG/5ML suspension 30 mL  30 mL Oral Q4H PRN Lindon Romp A, NP      . amLODipine (NORVASC) tablet 5 mg  5 mg Oral Daily  Lindon Romp A, NP   5 mg at 11/09/20 0825  . atorvastatin (LIPITOR) tablet 10 mg  10 mg Oral Daily Lindon Romp A, NP   10 mg at 11/09/20 3762  . busPIRone (BUSPAR) tablet 15 mg  15 mg Oral BID Lindon Romp A, NP   15 mg at 11/09/20 8315  . DULoxetine (CYMBALTA) DR capsule 30 mg  30 mg Oral q1800 Lindell Spar I, NP   30 mg at 11/08/20 1721  . DULoxetine (CYMBALTA) DR capsule 60 mg  60 mg Oral Daily Lindell Spar I, NP   60 mg at 11/09/20 0823  . EPINEPHrine (EPI-PEN) injection 0.3 mg  0.3 mg Intramuscular Once PRN Lindon Romp A, NP      . hydrOXYzine (ATARAX/VISTARIL) tablet 25 mg  25 mg Oral Q4H PRN Lindell Spar I, NP   25 mg at 11/08/20 2104  . levothyroxine (SYNTHROID) tablet 75 mcg  75 mcg Oral Q0600 Lindon Romp A, NP   75 mcg at 11/09/20 0615  . lisinopril (ZESTRIL) tablet 20 mg  20 mg Oral Daily Lindon Romp A, NP   20 mg at 11/09/20 1761  . lurasidone (LATUDA) tablet 40 mg  40 mg Oral q1800 Lindell Spar I, NP   40 mg at 11/08/20 1719  . magnesium hydroxide (MILK OF MAGNESIA) suspension 30 mL  30 mL Oral Daily PRN Lindon Romp A, NP      . pantoprazole (PROTONIX) EC tablet 40 mg  40 mg Oral Daily Lindon Romp A, NP   40 mg at 11/09/20 0823  . traZODone (DESYREL) tablet 50 mg  50 mg Oral QHS Freida Busman, MD        Lab Results:  Results for orders placed or performed during the hospital encounter of 11/08/20 (from the past 48 hour(s))  Hemoglobin A1c     Status: Abnormal   Collection Time: 11/08/20  5:53 PM  Result Value Ref Range   Hgb A1c MFr Bld 5.8 (H) 4.8 - 5.6 %    Comment: (NOTE) Pre diabetes:          5.7%-6.4%  Diabetes:              >6.4%  Glycemic control for   <7.0% adults with diabetes    Mean Plasma Glucose 119.76 mg/dL    Comment: Performed at [facility], [place] [address] [place], [place] [address]  Lipid panel     Status: Abnormal   Collection Time: 11/08/20  5:53  PM  Result Value Ref Range   Cholesterol 205 (H) 0 - 200 mg/dL   Triglycerides 88 <150  mg/dL   HDL 50 >40 mg/dL   Total CHOL/HDL Ratio 4.1 RATIO   VLDL 18 0 - 40 mg/dL   LDL Cholesterol 137 (H) 0 - 99 mg/dL    Comment:        Total Cholesterol/HDL:CHD Risk Coronary Heart Disease Risk Table                     Men   Women  1/2 Average Risk   3.4   3.3  Average Risk       5.0   4.4  2 X Average Risk   9.6   7.1  3 X Average Risk  23.4   11.0        Use the calculated Patient Ratio above and the CHD Risk Table to determine the patient's CHD Risk.        ATP III CLASSIFICATION (LDL):  <100     mg/dL   Optimal  100-129  mg/dL   Near or Above                    Optimal  130-159  mg/dL   Borderline  160-189  mg/dL   High  >190     mg/dL   Very High Performed at [facility] [address], [place], [place] [address]   TSH     Status: None   Collection Time: 11/08/20  5:53 PM  Result Value Ref Range   TSH 3.634 0.350 - 4.500 uIU/mL    Comment: Performed by a 3rd Generation assay with a functional sensitivity of <=0.01 uIU/mL. Performed at [facility], [place] [address], [place], [place] [address]   CBC with Differential/Platelet     Status: Abnormal   Collection Time: 11/09/20  6:45 AM  Result Value Ref Range   WBC 9.2 4.0 - 10.5 K/uL   RBC 4.64 3.87 - 5.11 MIL/uL   Hemoglobin 15.1 (H) 12.0 - 15.0 g/dL   HCT 45.8 36.0 - 46.0 %   MCV 98.7 80.0 - 100.0 fL   MCH 32.5 26.0 - 34.0 pg   MCHC 33.0 30.0 - 36.0 g/dL   RDW 13.2 11.5 - 15.5 %   Platelets 434 (H) 150 - 400 K/uL   nRBC 0.0 0.0 - 0.2 %   Neutrophils Relative % 59 %   Neutro Abs 5.4 1.7 - 7.7 K/uL   Lymphocytes Relative 29 %   Lymphs Abs 2.7 0.7 - 4.0 K/uL   Monocytes Relative 9 %   Monocytes Absolute 0.8 0.1 - 1.0 K/uL   Eosinophils Relative 2 %   Eosinophils Absolute 0.2 0.0 - 0.5 K/uL   Basophils Relative 1 %   Basophils Absolute 0.1 0.0 - 0.1 K/uL   Immature Granulocytes 0 %   Abs Immature Granulocytes 0.03 0.00 - 0.07 K/uL    Comment: Performed at [facility], [place] [address], [place], [place] [address]    Blood Alcohol level:  Lab Results  Component Value Date   ETH <10 123456    Metabolic Disorder Labs: Lab Results  Component Value Date   HGBA1C 5.8 (H) 11/08/2020   MPG 119.76 11/08/2020   MPG 126 08/11/2020   Lab Results  Component Value Date   PROLACTIN 10.6 06/18/2019   Lab Results  Component Value Date   CHOL 205 (H)  11/08/2020   TRIG 88 11/08/2020   HDL 50 11/08/2020   CHOLHDL 4.1 11/08/2020   VLDL 18 11/08/2020   LDLCALC 137 (H) 11/08/2020   LDLCALC 196 (H) 08/11/2020    Physical Findings: AIMS:  , ,  ,  ,    CIWA:    COWS:     Musculoskeletal: Strength & Muscle Tone: within normal limits Gait & Station: normal Patient leans: N/A  Psychiatric Specialty Exam: Physical Exam Constitutional:      Appearance: Normal appearance.  HENT:     Head: Normocephalic and atraumatic.  Pulmonary:     Effort: Pulmonary effort is normal.  Neurological:     Mental Status: She is alert.     Review of Systems  Constitutional: Negative for appetite change.  HENT: Negative for sore throat.   Psychiatric/Behavioral: Negative for suicidal ideas.    Blood pressure 134/90, pulse 88, temperature 97.7 F (36.5 C), temperature source Oral, resp. rate 16, height 5\' 1"  (1.549 m), weight 85.3 kg, last menstrual period 06/18/2017, SpO2 97 %.Body mass index is 35.52 kg/m.  General Appearance: Casual  Eye Contact:  Good  Speech:  Clear and Coherent  Volume:  Normal  Mood:  Euthymic  Affect:  Congruent  Thought Process:  Coherent  Orientation:  NA  Thought Content:  Logical  Suicidal Thoughts:  No  Homicidal Thoughts:  No  Memory:  Recent;   Fair  Judgement:  Fair  Insight:  Fair  Psychomotor Activity:  Normal  Concentration:  Concentration: Good  Recall:  NA  Fund of Knowledge:  NA  Language:  Good  Akathisia:  No  Handed:  Right  AIMS (if indicated):     Assets:  Communication  Skills Desire for Improvement Financial Resources/Insurance Resilience  ADL's:  Intact  Cognition:  WNL  Sleep:  Number of Hours: 6     Treatment Plan Summary: Daily contact with patient to assess and evaluate symptoms and progress in treatment Mrs. Salak is a 55 yo patient w/ hx of MDD w/ psychosis and is currently being treated for this as she did not fell her medications were working very well and she was having negative side effects. Patient appears to be doing well on her new regimen. However, will attempt to adjust the poor sleep with the addition of Trazodone. Patient reported negative side effects to melatonin in the past. Patient labs noted polycythemia with elevated Plts. Patient also had HLD. Patient reports that her PCP has been following all of these for a while. Of note patient Hgb has never been this high before at  15.1, but was still close to baseline. Plts appears to be close to baseline but they have also never been this high at 434. Patient TSH was WNL 3.634.  A1c was 5.8 down from 6 3 mon ago. MDD, recurrent, severe, w/ psychosis - lurasidone 40mg  daily - Cymbalta DR 60mg  daily and 30mg  at 1800 - Start Trazodone 50mg  QHS  Hashimoto's dx  Continuing home synthroid 26mcg  HTN - Continue home Lisinopril 20mg  - Continue home norvasc 5mg   HLD - Continue home lipitor 10mg   GERD - Continue home Protonix 40mg  daily  PRN -Tylenol 650mg  q6h, pain -Maalox 9ml q4h, indigestion -Atarax 25mg  TID, anxiety -Milk of Mag 35mL, constipation   PGY-1 Freida Busman, MD 11/09/2020, 3:37 PM

## 2020-11-10 ENCOUNTER — Other Ambulatory Visit (HOSPITAL_COMMUNITY): Payer: Self-pay | Admitting: Student in an Organized Health Care Education/Training Program

## 2020-11-10 LAB — PROLACTIN: Prolactin: 9.2 ng/mL (ref 4.8–23.3)

## 2020-11-10 MED ORDER — DULOXETINE HCL 60 MG PO CPEP: 60.0000 mg | ORAL_CAPSULE | Freq: Every day | ORAL | 0 refills | Status: DC

## 2020-11-10 MED ORDER — LURASIDONE HCL 40 MG PO TABS: 40.0000 mg | ORAL_TABLET | Freq: Every day | ORAL | 0 refills | Status: DC

## 2020-11-10 MED ORDER — TRAZODONE HCL 50 MG PO TABS: 50.0000 mg | ORAL_TABLET | Freq: Every day | ORAL | 0 refills | Status: DC

## 2020-11-10 MED ORDER — HYDROXYZINE HCL 25 MG PO TABS: 25.0000 mg | ORAL_TABLET | ORAL | 0 refills | Status: DC | PRN

## 2020-11-10 MED ORDER — DULOXETINE HCL 30 MG PO CPEP: 30.0000 mg | ORAL_CAPSULE | Freq: Every day | ORAL | 0 refills | Status: DC

## 2020-11-10 MED FILL — hydrOXYzine HCL 25 MG TABS: 25 | 5 days supply | Qty: 30 | Fill #0

## 2020-11-10 MED FILL — traZODone HCL 50 MG TABS: 50 | 30 days supply | Qty: 30 | Fill #0

## 2020-11-10 MED FILL — LATUDA 40 MG TABLET: 40 | 30 days supply | Qty: 30 | Fill #0

## 2020-11-10 MED FILL — DULoxetine HCL 30 MG CPEP: 30 | 30 days supply | Qty: 30 | Fill #0

## 2020-11-10 NOTE — Discharge Summary (Signed)
Physician Discharge Summary Note  Patient:  Shannon Santiago is an 55 y.o., female MRN:  [id number] DOB:  [birth year] Patient phone:  [phone] (home)  Patient address:   [place] [address],  Total Time spent with patient: 20 minutes  Date of Admission:  11/08/2020 Date of Discharge: 11/10/2020  Reason for Admission:   This is the first psychiatric admission in this [facility] for this 55 year old Caucasian female with long hx of depression since the age of 67.  She is admitted to the [facility] with complaints of worsening symptoms of depression triggering suicidal ideations without plans or intent. Prior to this hospitalization, patient was apparently receiving mental health treatment on an outpatient basis by her primary care provider Dr. Sheppard Coil. She reports during this admission evaluation that she was once receiving mental health treatment in a partial hospital treatment program at the [facility]. Reports indicated that she has tried multiple psychotropic medications without success. She is here for mood stabilization treatments. During this evaluation with Dr. Nelda Marseille presents, Shannon Santiago reports,  "I asked my father to take me to the [facility] yesterday because I was having suicidal thoughts. I have had suicidal thoughts most of my adult life, off & on, but has never attempted to hurt myself. However, I know I have had symptoms of depression since age 47, but was never treated for depression until my adult years. I actually did not know what I was dealing with at age 38 was depression. I thought that was how I should feel then. I cannot pinpoint the exact cause of my depression, but I know our parents fought like cats & dogs in front of [place] children. I can say that that was one of the reason why I'm depressed like I'm over the years. I know depression, Schizophrenia, alcoholism & Bipolar disorder run deep in my mother's side of the family. Since starting treatment for  depression, I have felt better sparingly, but never a time I can says I was not depressed. I have tried a lot of medications that did not help me. I have felt better on the combination of Wellbutrin & Cymbalta, but had tremors with Wellbutrin & that has to be stopped. I'm currently on Cymbalta 120 mg, then Abilify was added, made me too sleepy & foggy headed during the day at work. I was instructed to switch it to night time with no relief of the grogginess. And with the worsening depression, I was also having olfactory hallucinations. I was smelling cigarette smoke. I know my mother smoked heavily, but she has been dead from lung cancer 6 years ago. I was also hearing some voices during my wake hours in the morning. And with these symptoms of depression, I noticed that I lacked motivation & have lost interest in doing pleasurable things".  Patient denies any delusional thinking or paranoia.  Principal Problem: MDD (major depressive disorder), recurrent, severe, with psychosis ([place]) Discharge Diagnoses: Principal Problem:   MDD (major depressive disorder), recurrent, severe, with psychosis ([place])   Past Psychiatric History:Major depressive disorder, recurrent episodes   Past Medical History:  Past Medical History:  Diagnosis Date  . Allergy   . Anxiety   . Asthma   . Depression   . Family history of breast cancer   . Family history of colon cancer   . Family history of lung cancer   . Family history of prostate cancer   . Hyperlipidemia   . Hypertension   .  Hypothyroidism   . Prolonged PTT (partial thromboplastin time) 06/23/2019   Normal PT  . Thyroid disease     Past Surgical History:  Procedure Laterality Date  . ENDOMETRIAL BIOPSY  06/21/2019  . EYE SURGERY     x 5  . HYSTEROSCOPY WITH D & C N/A 10/27/2019   Procedure: DILATATION AND CURETTAGE /HYSTEROSCOPY;  Surgeon: Emily Filbert, MD;  Location: [facility];  Service: Gynecology;  Laterality: N/A;   Family History:   Family History  Problem Relation Age of Onset  . Hypertension Mother   . Stroke Mother   . Lung cancer Mother 18  . Anxiety disorder Mother   . Depression Mother   . Alcohol abuse Mother   . Skin cancer Father   . Prostate cancer Father 56  . Hypertension Father   . Diabetes Father   . Thyroid disease Maternal Aunt   . Thyroid disease Maternal Uncle   . Diabetes Brother   . Hypertension Brother   . Colon cancer Maternal Grandmother        dx 31s, d. 28s  . Prostate cancer Paternal Grandfather 54  . Breast cancer Cousin        dx 20s-30s   Family Psychiatric  History:  Maternal side of the family: Alcoholism, Depression, Schizophrenia, Bipolar disorder.  Social History:  Social History   Substance and Sexual Activity  Alcohol Use Yes   Comment: occ     Social History   Substance and Sexual Activity  Drug Use Never    Social History   Socioeconomic History  . Marital status: Single    Spouse name: Not on file  . Number of children: Not on file  . Years of education: Not on file  . Highest education level: Not on file  Occupational History  . Not on file  Tobacco Use  . Smoking status: Never Smoker  . Smokeless tobacco: Never Used  Vaping Use  . Vaping Use: Never used  Substance and Sexual Activity  . Alcohol use: Yes    Comment: occ  . Drug use: Never  . Sexual activity: Not Currently    Birth control/protection: Abstinence  Other Topics Concern  . Not on file  Social History Narrative  . Not on file   Social Determinants of Health   Financial Resource Strain: Not on file  Food Insecurity: Not on file  Transportation Needs: Not on file  Physical Activity: Not on file  Stress: Not on file  Social Connections: Not on file    Hospital Course:  Mrs. Borneman was admitted do to Olfactory hallucinations and SI and also reported that she did not like her current medication regimen as it was making her feel too sedated during the day. Patient was continued  on her home norvasc, lipitor, buspar, lisinopril, ans synthroid. Patient TSH was appropriate. Due to patient's history with Cymbalta but also feeling that it was helpful for her depression it was adjusted from her home dosage to 60mg  during the day and 30 mg in the late afternoon. Patient reported feeling well with no negative side effects like tremor with the medication. Patient's Abilify was discontinued as it was believed to be making the patient fel over sedated and replaced with Latuda. Patient felt significantly better and was not reported feeling depressed on the medication nor feelings of "grogginess." Patient reported that her family also thought she sounds better over the phone. Patient did not report any olfactory hallucinations during her hospitalization and  did not endorse SI, HI, nor AVH after her transition to Latuda 40mg . Patient also participated in the group mileu and in group therapy. Patient was pleasant throughout her stay. She did report having trouble sleeping and was started on trazodone 50mg  her 2nd night. The next morning patient reported that he sleep had improved and she had also made the effort to make the bed a bit more comfortable. Of note patient EKG's showed appropriate QTc's but also noted possible inferior infarcts of undetermined age that had not been seen on EKGs 1 year prior. Cardiology was consulted to read the EKGs and concluded that based on patient's risk factors they would make an appt to see patient in 3-4 weeks as the matter was not urgent but she should be evaluated.   Physical Findings: AIMS:  , ,  ,  ,    CIWA:    COWS:     Musculoskeletal: Strength & Muscle Tone: within normal limits Gait & Station: normal Patient leans: N/A  Psychiatric Specialty Exam: Physical Exam HENT:     Head: Normocephalic.  Pulmonary:     Effort: Pulmonary effort is normal.  Neurological:     Mental Status: She is alert.     Review of Systems  Cardiovascular: Negative  for chest pain.  Gastrointestinal: Negative for abdominal pain.  Neurological: Negative for dizziness.    Blood pressure (!) 143/94, pulse (!) 108, temperature 98.5 F (36.9 C), temperature source Oral, resp. rate 16, height 5\' 1"  (1.549 m), weight 85.3 kg, last menstrual period 06/18/2017, SpO2 97 %.Body mass index is 35.52 kg/m.  General Appearance: Casual  Eye Contact:  Good  Speech:  Clear and Coherent  Volume:  Normal  Mood:  Euthymic  Affect:  Congruent  Thought Process:  Coherent  Orientation:  Full (Time, Place, and Person)  Thought Content:  Logical  Suicidal Thoughts:  No  Homicidal Thoughts:  No  Memory:  Recent;   Good  Judgement:  Fair  Insight:  Fair  Psychomotor Activity:  Normal  Concentration:  Concentration: Good  Recall:  NA  Fund of Knowledge:  Good  Language:  Good  Akathisia:  No  Handed:  Right  AIMS (if indicated):     Assets:  Communication Skills Desire for Improvement Financial Resources/Insurance Housing Intimacy Resilience  ADL's:  Intact  Cognition:  WNL  Sleep:  Number of Hours: 6.75     Have you used any form of tobacco in the last 30 days? (Cigarettes, Smokeless Tobacco, Cigars, and/or Pipes): No    Blood Alcohol level:  Lab Results  Component Value Date   ETH <10 123456    Metabolic Disorder Labs:  Lab Results  Component Value Date   HGBA1C 5.8 (H) 11/08/2020   MPG 119.76 11/08/2020   MPG 126 08/11/2020   Lab Results  Component Value Date   PROLACTIN 9.2 11/09/2020   PROLACTIN 10.6 06/18/2019   Lab Results  Component Value Date   CHOL 205 (H) 11/08/2020   TRIG 88 11/08/2020   HDL 50 11/08/2020   CHOLHDL 4.1 11/08/2020   VLDL 18 11/08/2020   LDLCALC 137 (H) 11/08/2020   LDLCALC 196 (H) 08/11/2020    See Psychiatric Specialty Exam and Suicide Risk Assessment completed by Attending Physician prior to discharge.  Discharge destination:  Home  Is patient on multiple antipsychotic therapies at discharge:  No    Has Patient had three or more failed trials of antipsychotic monotherapy by history:  No  Recommended Plan for  Multiple Antipsychotic Therapies: NA   Allergies as of 11/10/2020      Reactions   Morphine And Related Other (See Comments)   Numb hands and feet   Peanut Oil Other (See Comments)   Stomach cramping, hand/foot swelling   Penicillins Hives   Polymyxin B-trimethoprim Other (See Comments), Photosensitivity, Swelling   Citalopram Other (See Comments)   SSRI's ineffective - Citalopram, Fluoxetine, Sertraline, Excitalopram    Food Nausea Only, Swelling, Palpitations, Other (See Comments), Cough   WHEAT   Prednisone Anxiety, Nausea Only, Other (See Comments), Palpitations   Singulair [montelukast Sodium] Other (See Comments)   Mood changes, worsening depression      Medication List    STOP taking these medications   ARIPiprazole 10 MG tablet Commonly known as: Abilify     TAKE these medications     Indication  ALPRAZolam 0.5 MG tablet Commonly known as: Xanax Take 0.5-1 tablets (0.25-0.5 mg total) by mouth 2 (two) times daily as needed for anxiety.  Indication: Feeling Anxious   amLODipine 5 MG tablet Commonly known as: NORVASC TAKE 1 TABLET BY MOUTH ONCE DAILY  Indication: High Blood Pressure Disorder   atorvastatin 10 MG tablet Commonly known as: LIPITOR Take 1 tablet (10 mg total) by mouth daily.  Indication: High Amount of Fats in the Blood   b complex vitamins tablet Take 1 tablet by mouth daily.  Indication: Vitamin Deficiency   busPIRone 15 MG tablet Commonly known as: BUSPAR Take 1 tablet (15 mg total) by mouth 2 (two) times daily.  Indication: Anxiety Disorder, Major Depressive Disorder   CALCIUM PO Take 500 mg by mouth daily.  Indication: Deficiency   cetirizine 10 MG tablet Commonly known as: ZYRTEC Take 10 mg by mouth daily.  Indication: Hayfever   DULoxetine 30 MG capsule Commonly known as: CYMBALTA Take 1 capsule (30 mg total) by  mouth daily at 6 PM. What changed: You were already taking a medication with the same name, and this prescription was added. Make sure you understand how and when to take each.  Indication: Major Depressive Disorder   DULoxetine 60 MG capsule Commonly known as: CYMBALTA Take 1 capsule (60 mg total) by mouth daily. Start taking on: November 11, 2020 What changed: how much to take  Indication: Major Depressive Disorder   EPINEPHrine 0.3 mg/0.3 mL Soaj injection Commonly known as: EPI-PEN Inject 0.3 mLs (0.3 mg total) into the muscle once as needed (anaphylaxis/allergic reaction).  Indication: Life-Threatening Hypersensitivity Reaction   Fish Oil 1000 MG Caps Take 1 capsule by mouth 2 (two) times daily.  Indication: Hyperlipidemia   fluticasone 50 MCG/ACT nasal spray Commonly known as: FLONASE Place 1 spray into both nostrils daily.  Indication: Allergic Rhinitis   Fluticasone-Salmeterol 250-50 MCG/DOSE Aepb Commonly known as: Advair Diskus Inhale 1 puff into the lungs 2 (two) times daily.  Indication: Asthma   GINGER ROOT PO Take by mouth.  Indication: Ailments   hydrOXYzine 25 MG tablet Commonly known as: ATARAX/VISTARIL Take 1 tablet (25 mg total) by mouth every 4 (four) hours as needed for anxiety (Sleep).  Indication: Feeling Anxious   IRON PO Take 325 mg by mouth daily.  Indication: Iron deficient anemia   levothyroxine 75 MCG tablet Commonly known as: SYNTHROID Take 1 tablet (75 mcg total) by mouth daily before breakfast. Will be due for labs around end of December 2021, early January 2022  Indication: Underactive Thyroid   lisinopril 20 MG tablet Commonly known as: ZESTRIL TAKE 1 TABLET BY MOUTH  ONCE DAILY  Indication: High Blood Pressure Disorder   lurasidone 40 MG Tabs tablet Commonly known as: LATUDA Take 1 tablet (40 mg total) by mouth daily at 6 PM.  Indication: Major Depressive Disorder   Magnesium 500 MG Caps Take 500 mg by mouth daily.   Indication: Historic med   multivitamin capsule Take 1 capsule by mouth daily.  Indication: Prevention   omeprazole 40 MG capsule Commonly known as: PRILOSEC Take 1 capsule (40 mg total) by mouth daily.  Indication: Gastroesophageal Reflux Disease   ProAir RespiClick [id number] (90 Base) MCG/ACT Aepb Generic drug: Albuterol Sulfate Inhale 1-2 puffs into the lungs every 4 (four) hours as needed.  Indication: Reversible Disease of Blockage in Breathing Passages   PROBIOTIC ADVANCED PO Take 1 capsule by mouth daily.  Indication: Prevention   Restasis 0.05 % ophthalmic emulsion Generic drug: cycloSPORINE 1 drop 2 (two) times daily.  Indication: Drying and Inflammation of Cornea and Conjunctiva of Eyes   traZODone 50 MG tablet Commonly known as: DESYREL Take 1 tablet (50 mg total) by mouth at bedtime.  Indication: Trouble Sleeping   vitamin C 1000 MG tablet Take 1,000 mg by mouth daily.  Indication: Inadequate Vitamin C   Vitamin D3 10 MCG (400 UNIT) tablet Take 400 Units by mouth daily.  Indication: Vitamin D Deficiency       Follow-up Information    Cheri Henry,MSW,LCSW Follow up on 11/13/2020.   Why: You have an appointment on 11/13/20 at 5:00 pm for therapy services.  This will be a Virtual appointment. Contact information: [address] [place], [place] [address]  P: [phone]       [facility] Follow up on 11/30/2020.   Specialty: Behavioral Health Why: You have an appointment for medication management services on 11/30/20 at 11:00 am.  This will be a Virtual appointment.  *PLEASE CALL TO CONFIRM DETAILS OF YOUR VIRTUAL APPOINTMENT. Contact information: [place] [phone]              Follow-up recommendations:Follow up recommendations: - Activity as tolerated. - Diet as recommended by PCP. - Keep all scheduled follow-up appointments as  recommended.   Comments:  Patient is instructed to take all prescribed medications as recommended. Report any side effects or adverse reactions to your outpatient psychiatrist. Patient is instructed to abstain from alcohol and illegal drugs while on prescription medications. In the event of worsening symptoms, patient is instructed to call the crisis hotline, 911, or go to the nearest emergency department for evaluation and treatment.    Signed: PGY-1 Freida Busman, MD 11/10/2020, 5:19 PM

## 2020-11-10 NOTE — Progress Notes (Addendum)
   11/10/20 1000  Psych Admission Type (Psych Patients Only)  Admission Status Voluntary  Psychosocial Assessment  Patient Complaints None  Eye Contact Fair  Facial Expression Anxious  Affect Appropriate to circumstance  Speech Logical/coherent  Interaction Assertive  Motor Activity Slow  Appearance/Hygiene Unremarkable;In scrubs  Behavior Characteristics Cooperative  Mood Pleasant;Anxious  Aggressive Behavior  Effect No apparent injury  Thought Process  Coherency WDL  Content WDL  Delusions None reported or observed  Perception WDL  Hallucination None reported or observed  Judgment WDL  Confusion None  Danger to Self  Current suicidal ideation? Denies  Self-Injurious Behavior No self-injurious ideation or behavior indicators observed or expressed   Agreement Not to Harm Self Yes  Description of Agreement Verbally contracted for safety  Danger to Others  Danger to Others None reported or observed    D. Pt presents as friendly- smiles upon approach- has been calm and cooperative on the unit- observed attending groups and interacting well with peers and staff. Per pt's self inventory, pt rated her depression, hopelessness and anxiety a 2/2/3, respectively. Pt writes that her goals to work on today are: "attend more groups; more coping skills".  Pt currently denies SI/HI and AVH  A. Labs and vitals monitored. Pt given and educated on medications. Pt supported emotionally and encouraged to express concerns and ask questions.   R. Pt remains safe with 15 minute checks. Will continue POC.

## 2020-11-10 NOTE — Progress Notes (Signed)
[place] NOVEL CORONAVIRUS (COVID-19) DAILY CHECK-OFF SYMPTOMS - answer yes or no to each - every day NO YES  Have you had a fever in the past 24 hours?  . Fever (Temp > 37.80C / 100F) X   Have you had any of these symptoms in the past 24 hours? . New Cough .  Sore Throat  .  Shortness of Breath .  Difficulty Breathing .  Unexplained Body Aches   X   Have you had any one of these symptoms in the past 24 hours not related to allergies?   . Runny Nose .  Nasal Congestion .  Sneezing   X   If you have had runny nose, nasal congestion, sneezing in the past 24 hours, has it worsened?  X   EXPOSURES - check yes or no X   Have you traveled outside the state in the past 14 days?  X   Have you been in contact with someone with a confirmed diagnosis of COVID-19 or PUI in the past 14 days without wearing appropriate PPE?  X   Have you been living in the same home as a person with confirmed diagnosis of COVID-19 or a PUI (household contact)?    X   Have you been diagnosed with COVID-19?    X              What to do next: Answered NO to all: Answered YES to anything:   Proceed with unit schedule Follow the [facility] Inpatient Flowsheet.   

## 2020-11-10 NOTE — Progress Notes (Signed)
  [facility] Adult Case Management Discharge Plan :  Will you be returning to the same living situation after discharge:  Yes,  to home At discharge, do you have transportation home?: Yes,  brother to pick this patient up Do you have the ability to pay for your medications: Yes,  has insurance   Release of information consent forms completed and in the chart;  Patient's signature needed at discharge.  Patient to Follow up at:  Follow-up Information    Cheri Henry,MSW,LCSW Follow up on 11/13/2020.   Why: You have an appointment on 11/13/20 at 5:00 pm for therapy services.  This will be a Virtual appointment. Contact information: [address] [place], [place] [address]  P: [phone]       [facility] Follow up on 11/30/2020.   Specialty: Behavioral Health Why: You have an appointment for medication management services on 11/30/20 at 11:00 am.  This will be a Virtual appointment.  *PLEASE CALL TO CONFIRM DETAILS OF YOUR VIRTUAL APPOINTMENT. Contact information: [place]  [address] [place] [phone]              Next level of care provider has access to [place] and Suicide Prevention discussed: Yes,   with patient  Have you used any form of tobacco in the last 30 days? (Cigarettes, Smokeless Tobacco, Cigars, and/or Pipes): No  Has patient been referred to the Quitline?: N/A patient is not a smoker  Patient has been referred for addiction treatment: [facility], LCSW 11/10/2020, 11:00 AM

## 2020-11-10 NOTE — Progress Notes (Signed)
Recreation Therapy Notes  Date:  1.21.22 Time: 0935 Location: [address] Dayroom  Group Topic: Stress Management  Goal Area(s) Addresses:  Patient will identify positive stress management techniques. Patient will identify benefits of using stress management post d/c.  Behavioral Response:  Engaged  Intervention: Stress Management  Activity: Guided Imagery.  LRT read a script that took patients on a journey to anywhere they imagined by floating on a cloud.  Patients were to listen and follow along as script was read to fully engage in activity.    Education:  Stress Management, Discharge Planning.   Education Outcome: Acknowledges Education  Clinical Observations/Feedback:  Pt attended and participated in activity.    Victorino Sparrow, LRT/CTRS         Victorino Sparrow A 11/10/2020 11:58 AM

## 2020-11-10 NOTE — [facility] Suicide Risk Assessment (Addendum)
[facility] INPATIENT:  Family/Significant Other Suicide Prevention Education   Suicide Prevention Education:  Contact Attempts: brother Ariann Khaimov [phone]), has been identified by the patient as the family member/significant other with whom the patient will be residing, and identified as the person(s) who will aid the patient in the event of a mental health crisis.  With written consent from the patient, two attempts were made to provide suicide prevention education, prior to and/or following the patient's discharge.  We were unsuccessful in providing suicide prevention education.  A suicide education pamphlet was given to the patient to share with family/significant other.   Date and time of first attempt: 11/10/20 at 9:45am CSW left a HIPAA compliant voicemail for Mr. Lekas Date and time of second attempt: 11/10/20 at 11:00am CSW left a HIPAA compliant voicemail for patients brother.    Unable to reach supports, SPE pamphlet placed on chart for patient to share with supports at discharge.    Darletta Moll MSW, LCSW Clincal Social Worker  [facility]

## 2020-11-10 NOTE — Progress Notes (Signed)
   11/09/20 2000  Psych Admission Type (Psych Patients Only)  Admission Status Voluntary  Psychosocial Assessment  Patient Complaints Anxiety  Eye Contact Fair  Facial Expression Anxious  Affect Appropriate to circumstance  Speech Logical/coherent  Interaction Assertive  Motor Activity Slow  Appearance/Hygiene Unremarkable;In scrubs  Behavior Characteristics Cooperative  Mood Pleasant;Anxious  Thought Process  Coherency WDL  Content WDL  Delusions None reported or observed  Perception WDL  Hallucination None reported or observed  Judgment WDL  Confusion None  Danger to Self  Current suicidal ideation? Denies  Danger to Others  Danger to Others None reported or observed   Pt seen in her room. Pt denies SI, HI,AVH and pain. Pt pleasant. States she had pain earlier in the day from the bed exacerbating her chronic back pain. States pain gone after Tylenol. Also offered to adjust medical bed in pt's room to assist with pain-free sleep.

## 2020-11-10 NOTE — BHH Suicide Risk Assessment (Signed)
[facility] Discharge Suicide Risk Assessment   Principal Problem: MDD (major depressive disorder), recurrent, severe, with psychosis ([place]) Discharge Diagnoses: Principal Problem:   MDD (major depressive disorder), recurrent, severe, with psychosis ([place])  Total Time Spent in Direct Patient Care:  I personally spent 30 minutes on the unit in direct patient care. The direct patient care time included face-to-face time with the patient, reviewing the patient's chart, communicating with other professionals, and coordinating care. Greater than 50% of this time was spent in counseling or coordinating care with the patient regarding goals of hospitalization, psycho-education, and discharge planning needs.  Patient is a 55y/o female with a long-standing h/o MDD who was admitted for worsening symptoms of depression with associated suicidal ideation. On admission she also reported intermittent AH and OH associated with her depression.   On assessment today, she states she is tolerating the Latuda without noted side-effects. She specifically denies confusion, foggy mentation, or oversedation with this medication. She states her mood is brighter and she has no SI, intent or plan. She denies AVH, OH, paranoia, or delusions. She states she slept better overnight and reports stable appetite. She voices no physical complaints today and specifically denies TD/EPS symptoms. Time was spent discussing her EKG findings and lab results. She was encouraged to keep a scheduled outpatient appointment with cardiology after discharge for f/u of noted EKG changes. Time was given for questions.   Musculoskeletal: Strength & Muscle Tone: within normal limits Gait & Station: normal Patient leans: N/A  Psychiatric Specialty Exam: Review of Systems  Constitutional: Negative for fever.  Respiratory: Negative for shortness of breath.   Cardiovascular: Negative for chest pain.  Gastrointestinal: Negative for diarrhea and vomiting.   Neurological: Negative for headaches.    Blood pressure (!) 143/94, pulse (!) 108, temperature 98.5 F (36.9 C), temperature source Oral, resp. rate 16, height 5\' 1"  (1.549 m), weight 85.3 kg, last menstrual period 06/18/2017, SpO2 97 %.Body mass index is 35.52 kg/m.  General Appearance: well groomed, casually dressed in scrubs, well engaged with examiner  Eye Contact::  Good  Speech:  Clear and Coherent and Normal Rate  Volume:  Normal  Mood:described as "good" - appears more euthymic  Affect:  Brighter, can smile today  Thought Process:  Goal Directed and Linear  Orientation:  Full (Time, Place, and Person)  Thought Content:  Denies AVH, delusions, OH, paranoia and has no signs of acute psychosis on exam; no obsessions/compulsions noted; denies SI or HI  Suicidal Thoughts:  No  Homicidal Thoughts:  No  Memory:  Recent;   Good  Judgement:  Good  Insight:  Good  Psychomotor Activity:  Normal, no cogwheeling or akatheisas  Concentration:  Good  Recall:  Good  Fund of Knowledge:Good  Language: Good  Akathisia:  Negative  AIMS (if indicated):   0  Assets:  Communication Skills Desire for Improvement Housing Resilience Social Support  Sleep:  Number of Hours: 6.75  Cognition: WNL  ADL's:  Intact   Mental Status Per Nursing Assessment::   On Admission:  Suicidal ideation indicated by patient,Suicide plan  Demographic Factors:  single, caucasian  Loss Factors: death of mother in 22  Historical Factors: Family history of mental illness or substance abuse, prior h/o trauma as child  Risk Reduction Factors:   Sense of responsibility to family, Employed, Living with another person, especially a relative, Positive social support, Positive therapeutic relationship and Positive coping skills or problem solving skills  Continued Clinical Symptoms:  Previous Psychiatric Diagnoses and Treatments  Current diagnosis of MDD with psychotic features  Cognitive Features That  Contribute To Risk:  None    Suicide Risk:  Mild:  Suicidal ideation of limited frequency, intensity, duration, and specificity.  There are no identifiable plans, no associated intent, mild dysphoria and related symptoms, good self-control (both objective and subjective assessment), few other risk factors, and identifiable protective factors, including available and accessible social support.   Follow-up Information    Cheri Henry,MSW,LCSW Follow up on 11/13/2020.   Why: You have an appointment on 11/13/20 at 5:00 pm for therapy services.  This will be a Virtual appointment. Contact information: [address] [place], [place] [address]  P: [phone]       [place] Follow up on 11/30/2020.   Specialty: Behavioral Health Why: You have an appointment for medication management services on 11/30/20 at 11:00 am.  This will be a Virtual appointment.  *PLEASE CALL TO CONFIRM DETAILS OF YOUR VIRTUAL APPOINTMENT. Contact information: [place] [phone]              Plan Of Care/Follow-up recommendations:  Activity:  as tolerated Diet:  heart healthy Other:  Encouraged patient to keep scheduled outpatient follow up appointments and to be compliant with medications. Encouraged patient to have ongoing metabolic lab, weight, CBC, and EKG monitoring while on an atypical antipsychotic. She was advised of her EKG changes and the need for cardiology follow up.   Harlow Asa, MD, FAPA 11/10/2020, 10:51 AM

## 2020-11-10 NOTE — Progress Notes (Signed)
Adult Psychoeducational Group Note  Date:  11/10/2020 Time:  4:25 AM  Group Topic/Focus:  Wrap-Up Group:   The focus of this group is to help patients review their daily goal of treatment and discuss progress on daily workbooks.  Participation Level:  Active  Participation Quality:  Appropriate  Affect:  Appropriate  Cognitive:  Appropriate  Insight: Appropriate  Engagement in Group:  Engaged  Modes of Intervention:  Discussion  Additional Comments:  Pt said her day was a 7. The one positive thing that happen to her she meet a lot good people and she enjoyed interaction today.  Lenice Llamas Long 11/10/2020, 4:25 AM

## 2020-11-10 NOTE — Progress Notes (Signed)
Pt discharged to lobby, where father is waiting for patient. Pt was stable and appreciative at that time. All papers were given and valuables returned. Verbal understanding expressed. Denies SI/HI and A/VH. Pt given opportunity to express concerns and ask questions.

## 2020-11-13 ENCOUNTER — Telehealth: Payer: Self-pay

## 2020-11-13 NOTE — Telephone Encounter (Signed)
-----   Message from Thompson Grayer, RN sent at 11/10/2020 12:26 PM EST ----- Regarding: FW: 3-4 weeks follow up Can you call patient and schedule her to see Dr Gasper Sells? She is currently in the hospital. Thanks, Fraser Din ----- Message ----- From: Werner Lean, MD Sent: 11/10/2020   9:14 AM EST To: [facility] Triage Subject: 3-4 weeks follow up                            Received a call from out behavioral health colleagues asking if we would be able to put this fine lady on our schedule for evaluation for EKG changes (inferior infarct pattern).  I am happy to see her  and she is a [employer] employee so I don't believe there will be insurance issues.  Could we make this happen?  Thanks,  [employer]

## 2020-11-13 NOTE — Telephone Encounter (Signed)
Rn attempted to call the patient  to make f/u appt, had to leave a message. Will try to call back later.

## 2020-11-15 NOTE — Telephone Encounter (Signed)
Pt is scheduled with Dr. Gasper Sells for 11/23/20 at [facility]

## 2020-11-20 NOTE — Telephone Encounter (Signed)
Appointment changed to Dr. Harriet Masson 12/06/20 at 9:20 am. This patient is established with Dr. Harriet Masson already.

## 2020-11-20 NOTE — Telephone Encounter (Signed)
Appointment changed to Dr. Harriet Masson 12/06/20 at 9:20 am. This patient is established with Dr. Harriet Masson already.   Patient agreeable.

## 2020-11-23 ENCOUNTER — Ambulatory Visit: Payer: No Typology Code available for payment source | Admitting: Cardiology

## 2020-11-23 ENCOUNTER — Ambulatory Visit: Payer: No Typology Code available for payment source | Admitting: Internal Medicine

## 2020-11-24 ENCOUNTER — Encounter: Payer: Self-pay | Admitting: Osteopathic Medicine

## 2020-11-24 DIAGNOSIS — R079 Chest pain, unspecified: Secondary | ICD-10-CM

## 2020-11-27 MED FILL — ATORVASTATIN CALCIUM 10 MG: 10 | 90 days supply | Qty: 90 | Fill #1

## 2020-11-30 ENCOUNTER — Telehealth (INDEPENDENT_AMBULATORY_CARE_PROVIDER_SITE_OTHER): Payer: No Typology Code available for payment source | Admitting: Psychiatry

## 2020-11-30 ENCOUNTER — Encounter (HOSPITAL_COMMUNITY): Payer: Self-pay | Admitting: Psychiatry

## 2020-11-30 ENCOUNTER — Other Ambulatory Visit (HOSPITAL_COMMUNITY): Payer: Self-pay | Admitting: Psychiatry

## 2020-11-30 DIAGNOSIS — F333 Major depressive disorder, recurrent, severe with psychotic symptoms: Secondary | ICD-10-CM | POA: Diagnosis not present

## 2020-11-30 DIAGNOSIS — F411 Generalized anxiety disorder: Secondary | ICD-10-CM | POA: Diagnosis not present

## 2020-11-30 MED ORDER — LURASIDONE HCL 40 MG PO TABS: 40.0000 mg | ORAL_TABLET | Freq: Every day | ORAL | 0 refills | Status: DC

## 2020-11-30 MED ORDER — DULOXETINE HCL 60 MG PO CPEP: 60.0000 mg | ORAL_CAPSULE | Freq: Every day | ORAL | 0 refills | Status: DC

## 2020-11-30 MED ORDER — DULOXETINE HCL 30 MG PO CPEP: 30.0000 mg | ORAL_CAPSULE | Freq: Every day | ORAL | 0 refills | Status: DC

## 2020-11-30 NOTE — Progress Notes (Signed)
Psychiatric Initial Adult Assessment   Patient Identification: Shannon Santiago MRN:  [id number] Date of Evaluation:  11/30/2020 Referral Source: [facility] discharge Chief Complaint:  establish care, depression Visit Diagnosis:    ICD-10-CM   1. MDD (major depressive disorder), recurrent, severe, with psychosis ([place])  F33.3   2. GAD (generalized anxiety disorder)  F41.1   Virtual Visit via Video Note  I connected with [name] on 11/30/20 at 11:00 AM EST by a video enabled telemedicine application and verified that I am speaking with the correct person using two identifiers.  Location: Patient: home office Provider: home office   I discussed the limitations of evaluation and management by telemedicine and the availability of in person appointments. The patient expressed understanding and agreed to proceed.     I discussed the assessment and treatment plan with the patient. The patient was provided an opportunity to ask questions and all were answered. The patient agreed with the plan and demonstrated an understanding of the instructions.   The patient was advised to call back or seek an in-person evaluation if the symptoms worsen or if the condition fails to improve as anticipated.  I provided 30  minutes of non-face-to-face time during this encounter.   [name], MD   History of Present Illness: Patient is a 55 years old currently single Caucasian female hospital discharge January 21 she was admitted for 4 days. 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  She has lost her mother 6 years ago she was a chronic smoker and she felt that the smoke smell was was there.  She was also feeling down depressed.  She has  been on different medication recently medication has been prescribed by primary care physician and Abilify was given she felt maybe Abilify has made her to feel like that she was still down depressed and was having suicidal thoughts for which she got admitted  She did get better in the hospital her hospital medication Abilify was discontinued and started on Latuda now at a dose of 40 mg she does not endorse hearing voices or any paranoia and was discharged with Latuda 40 mg and Cymbalta was adjusted to 90 mg/day  She is doing somewhat better does not endorse hopelessness or suicidal thoughts.  She gives a long history of having depression since young age starting at age 77 she was raised in a strict family and her parents restrict her mom had suffered from depression alcohol there is also family history of bipolar and schizophrenia  She felt she has been feeling down low self-esteem since younger age  Starting at age 89 she was having depression episodes including suicidal thoughts for which she started treatment and has seen a psychiatrist in the past till 2018.  After that she has seen a primary care physician for management of medication states that she has been on different medication for depression one way or the other it has not worked or was not helpful  She also endorses anxiety excessive worries at times she does not socialize mostly keeps to herself she has anxiety when she has to go out.  She is also on medication feeling Xanax from the primary care physician  Does not endorse psychotic  symptoms as of now does not endorse manic symptoms currently or in the past  Aggravating factors; grief of mom dying 6 years ago, loneliness, dad sick Modifying factors pets some friends job but it can be stressful at times  Duration since young age  Alcohol abuse or using drugs denies Associated Signs/Symptoms:   Past Psychiatric History: depression, anxity  Previous Psychotropic Medications: Yes    Substance Abuse History in the last 12 months:  No.  Consequences of Substance Abuse: NA  Past Medical History:  Past Medical History:  Diagnosis Date  . Allergy   . Anxiety   . Asthma   . Depression   . Family history of breast cancer   . Family history of colon cancer   . Family history of lung cancer   . Family history of prostate cancer   . Hyperlipidemia   . Hypertension   . Hypothyroidism   . Prolonged PTT (partial thromboplastin time) 06/23/2019   Normal PT  . Thyroid disease     Past Surgical History:  Procedure Laterality Date  . ENDOMETRIAL BIOPSY  06/21/2019  . EYE SURGERY     x 5  . HYSTEROSCOPY WITH D & C N/A 10/27/2019   Procedure: DILATATION AND CURETTAGE /HYSTEROSCOPY;  Surgeon: Emily Filbert, MD;  Location: [facility];  Service: Gynecology;  Laterality: N/A;    Family Psychiatric History: mom depression, anxiety, alcohol use  Family History:  Family History  Problem Relation Age of Onset  . Hypertension Mother   . Stroke Mother   . Lung cancer Mother 7  . Anxiety disorder Mother   . Depression Mother   . Alcohol abuse Mother   . Skin cancer Father   . Prostate cancer Father 109  . Hypertension Father   . Diabetes Father   . Thyroid disease Maternal Aunt   . Thyroid disease Maternal Uncle   . Diabetes Brother   . Hypertension Brother   . Colon cancer Maternal Grandmother        dx 51s, d. 80s  . Prostate cancer Paternal Grandfather 2  . Breast cancer Cousin        dx 20s-30s    Social History:   Social History   Socioeconomic History  . Marital status: Single    Spouse name: Not on file  . Number of children: Not on file  . Years of education: Not on file  . Highest education level: Not on file  Occupational History  . Not on file  Tobacco Use  . Smoking status: Never Smoker  . Smokeless tobacco: Never Used  Vaping Use  . Vaping Use: Never used  Substance and Sexual Activity  . Alcohol use: Yes    Comment: occ   . Drug use: Never  . Sexual activity: Not Currently    Birth control/protection: Abstinence  Other Topics Concern  . Not on file  Social History Narrative  . Not on file   Social Determinants of Health   Financial Resource Strain: Not on file  Food Insecurity: Not on file  Transportation Needs: Not on file  Physical Activity: Not on file  Stress: Not on file  Social Connections: Not on file    Additional Social History: grew up with parents, they were strict, difficult to express emotions when young, felt depressed since young age  Allergies:   Allergies  Allergen Reactions  . Morphine And Related Other (See Comments)    Numb hands and feet  . Peanut Oil  Other (See Comments)    Stomach cramping, hand/foot swelling  . Penicillins Hives  . Polymyxin B-Trimethoprim Other (See Comments), Photosensitivity and Swelling  . Citalopram Other (See Comments)    SSRI's ineffective - Citalopram, Fluoxetine, Sertraline, Excitalopram   . Food Nausea Only, Swelling, Palpitations, Other (See Comments) and Cough    WHEAT  . Prednisone Anxiety, Nausea Only, Other (See Comments) and Palpitations  . Singulair [Montelukast Sodium] Other (See Comments)    Mood changes, worsening depression    Metabolic Disorder Labs: Lab Results  Component Value Date   HGBA1C 5.8 (H) 11/08/2020   MPG 119.76 11/08/2020   MPG 126 08/11/2020   Lab Results  Component Value Date   PROLACTIN 9.2 11/09/2020   PROLACTIN 10.6 06/18/2019   Lab Results  Component Value Date   CHOL 205 (H) 11/08/2020   TRIG 88 11/08/2020   HDL 50 11/08/2020   CHOLHDL 4.1 11/08/2020   VLDL 18 11/08/2020   LDLCALC 137 (H) 11/08/2020   LDLCALC 196 (H) 08/11/2020   Lab Results  Component Value Date   TSH 3.634 11/08/2020    Therapeutic Level Labs: No results found for: LITHIUM No results found for: CBMZ No results found for: VALPROATE  Current Medications: Current Outpatient Medications  Medication Sig Dispense  Refill  . Albuterol Sulfate (PROAIR RESPICLICK) 875 (90 Base) MCG/ACT AEPB Inhale 1-2 puffs into the lungs every 4 (four) hours as needed. 1 each 4  . ALPRAZolam (XANAX) 0.5 MG tablet Take 0.5-1 tablets (0.25-0.5 mg total) by mouth 2 (two) times daily as needed for anxiety. 15 tablet 0  . amLODipine (NORVASC) 5 MG tablet TAKE 1 TABLET BY MOUTH ONCE DAILY 90 tablet 1  . Ascorbic Acid (VITAMIN C) 1000 MG tablet Take 1,000 mg by mouth daily.    Marland Kitchen atorvastatin (LIPITOR) 10 MG tablet Take 1 tablet (10 mg total) by mouth daily. 90 tablet 3  . b complex vitamins tablet Take 1 tablet by mouth daily.    . busPIRone (BUSPAR) 15 MG tablet Take 1 tablet (15 mg total) by mouth 2 (two) times daily. 180 tablet 3  . CALCIUM PO Take 500 mg by mouth daily.    . cetirizine (ZYRTEC) 10 MG tablet Take 10 mg by mouth daily.    . Cholecalciferol (VITAMIN D3) 10 MCG (400 UNIT) tablet Take 400 Units by mouth daily.    . DULoxetine (CYMBALTA) 30 MG capsule Take 1 capsule (30 mg total) by mouth daily at 6 PM. 30 capsule 0  . DULoxetine (CYMBALTA) 60 MG capsule Take 1 capsule (60 mg total) by mouth daily. 30 capsule 0  . EPINEPHrine 0.3 mg/0.3 mL IJ SOAJ injection Inject 0.3 mLs (0.3 mg total) into the muscle once as needed (anaphylaxis/allergic reaction). 1 each 3  . Ferrous Sulfate (IRON PO) Take 325 mg by mouth daily.    . fluticasone (FLONASE) 50 MCG/ACT nasal spray Place 1 spray into both nostrils daily. 16 g 5  . Fluticasone-Salmeterol (ADVAIR DISKUS) 250-50 MCG/DOSE AEPB Inhale 1 puff into the lungs 2 (two) times daily. 60 each 3  . Ginger, Zingiber officinalis, (GINGER ROOT PO) Take by mouth.    . levothyroxine (SYNTHROID) 75 MCG tablet Take 1 tablet (75 mcg total) by mouth daily before breakfast. Will be due for labs around end of December 2021, early January 2022 90 tablet 0  . lisinopril (ZESTRIL) 20 MG tablet TAKE 1 TABLET BY MOUTH ONCE DAILY 90 tablet 1  . lurasidone (LATUDA) 40 MG TABS tablet Take  1 tablet (40  mg total) by mouth daily at 6 PM. 30 tablet 0  . Magnesium 500 MG CAPS Take 500 mg by mouth daily.    . Multiple Vitamin (MULTIVITAMIN) capsule Take 1 capsule by mouth daily.    . Omega-3 Fatty Acids (FISH OIL) 1000 MG CAPS Take 1 capsule by mouth 2 (two) times daily.    Marland Kitchen omeprazole (PRILOSEC) 40 MG capsule Take 1 capsule (40 mg total) by mouth daily. 30 capsule 2  . Probiotic Product (PROBIOTIC ADVANCED PO) Take 1 capsule by mouth daily.    . RESTASIS 0.05 % ophthalmic emulsion 1 drop 2 (two) times daily.    . traZODone (DESYREL) 50 MG tablet Take 1 tablet (50 mg total) by mouth at bedtime. 30 tablet 0   No current facility-administered medications for this visit.     Psychiatric Specialty Exam: Review of Systems  Cardiovascular: Negative for chest pain.  Psychiatric/Behavioral: Negative for agitation and self-injury.    Last menstrual period 06/18/2017.There is no height or weight on file to calculate BMI.  General Appearance: Casual  Eye Contact:  Fair  Speech:  Clear and Coherent  Volume:  Normal  Mood:  somewhat subdued  Affect:  Congruent  Thought Process:  Goal Directed  Orientation:  Full (Time, Place, and Person)  Thought Content:  Rumination  Suicidal Thoughts:  No  Homicidal Thoughts:  No  Memory:  Immediate;   Fair Recent;   Fair  Judgement:  Fair  Insight:  Fair  Psychomotor Activity:  Normal  Concentration:  Concentration: Fair and Attention Span: Fair  Recall:  [place] of Knowledge:Good  Language: Good  Akathisia:  No  Handed:    AIMS (if indicated):  No involuntary movements  Assets:  Desire for Improvement  ADL's:  Intact  Cognition: WNL  Sleep:  variable to fair   Screenings: AUDIT   Flowsheet Row Admission (Discharged) from 11/08/2020 in [facility]  Alcohol Use Disorder Identification Test Final Score (AUDIT) 3    GAD-7   Flowsheet Row Video Visit from 09/11/2020 in [facility] Video Visit from 08/25/2020 in [facility] Visit from 08/11/2020 in [facility] Visit from 11/24/2019 in [facility] Video Visit from 09/10/2019 in [facility]  Total GAD-7 Score 16 16 16 15 16     PHQ2-9   Flowsheet Row Video Visit from 11/30/2020 in [facility] Video Visit from 09/11/2020 in [facility] Video Visit from 08/25/2020 in [facility] Office Visit from 08/11/2020 in [facility] Office Visit from 11/24/2019 in [facility]  PHQ-2 Total Score 3 4 6 6 4   PHQ-9 Total Score 9 20 23 25 13     Flowsheet Row Video Visit from 11/30/2020 in [facility] Admission (Discharged) from 11/08/2020 in [facility]  C-SSRS RISK CATEGORY Error: Q3, 4, or 5 should not be populated when Q2 is No Moderate Risk      Assessment and Plan: as follows  Major depressive disorder recurrent severe with psychotic features; does not endorse paranoia or feeling there is smoke smell.  She feels her depression is is there but not worse she feels [place] is  helping as of now she does not want to increase the medication dose she is also taking Cymbalta 60+30 she will continue therapy as well  Generalized anxiety disorder; continue Cymbalta.   Discussed and reviewed medication as of now she feels medication is not to be adjusted if needed we can increase the Latuda discussed to add more activities and how to distract from negative thoughts continue therapy  Follow-up in 3 to 4 weeks or earlier if needed medications refills done   [name], MD 2/10/202211:51 AM

## 2020-12-04 MED FILL — DULoxetine HCL 30 MG CPEP: 30 | 30 days supply | Qty: 30 | Fill #0

## 2020-12-04 MED FILL — LATUDA 40 MG TABLET: 40 | 30 days supply | Qty: 30 | Fill #0

## 2020-12-04 MED FILL — OMEPRAZOLE 40 MG CPDR: 40 | 30 days supply | Qty: 30 | Fill #2

## 2020-12-05 DIAGNOSIS — E079 Disorder of thyroid, unspecified: Secondary | ICD-10-CM | POA: Insufficient documentation

## 2020-12-05 DIAGNOSIS — E785 Hyperlipidemia, unspecified: Secondary | ICD-10-CM | POA: Insufficient documentation

## 2020-12-05 DIAGNOSIS — F32A Depression, unspecified: Secondary | ICD-10-CM | POA: Insufficient documentation

## 2020-12-05 DIAGNOSIS — T7840XA Allergy, unspecified, initial encounter: Secondary | ICD-10-CM | POA: Insufficient documentation

## 2020-12-05 DIAGNOSIS — F419 Anxiety disorder, unspecified: Secondary | ICD-10-CM | POA: Insufficient documentation

## 2020-12-05 DIAGNOSIS — J45909 Unspecified asthma, uncomplicated: Secondary | ICD-10-CM | POA: Insufficient documentation

## 2020-12-05 DIAGNOSIS — I10 Essential (primary) hypertension: Secondary | ICD-10-CM | POA: Insufficient documentation

## 2020-12-05 DIAGNOSIS — E039 Hypothyroidism, unspecified: Secondary | ICD-10-CM | POA: Insufficient documentation

## 2020-12-06 ENCOUNTER — Telehealth (INDEPENDENT_AMBULATORY_CARE_PROVIDER_SITE_OTHER): Payer: No Typology Code available for payment source | Admitting: Cardiology

## 2020-12-06 ENCOUNTER — Encounter: Payer: Self-pay | Admitting: Cardiology

## 2020-12-06 ENCOUNTER — Other Ambulatory Visit: Payer: Self-pay | Admitting: Internal Medicine

## 2020-12-06 DIAGNOSIS — R079 Chest pain, unspecified: Secondary | ICD-10-CM | POA: Diagnosis not present

## 2020-12-06 MED ORDER — METOPROLOL TARTRATE 100 MG PO TABS: ORAL_TABLET | ORAL | 0 refills | Status: DC

## 2020-12-06 MED ORDER — ATORVASTATIN CALCIUM 20 MG PO TABS: 20.0000 mg | ORAL_TABLET | Freq: Every day | ORAL | 3 refills | Status: DC

## 2020-12-06 MED FILL — ATORVASTATIN CALCIUM 20 MG: 20 | 90 days supply | Qty: 90 | Fill #0

## 2020-12-06 MED FILL — METOPROLOL TARTRATE 100 MG: 100 | 1 days supply | Qty: 1 | Fill #0

## 2020-12-06 NOTE — Progress Notes (Signed)
Telehealth visit  Date:  12/06/2020   ID:  Shannon Santiago, DOB 02-Jan-1966, MRN [id number]  The patient is at home-the patient consents to this video visit. I am in the office  PCP:  Emeterio Reeve, DO  Cardiologist:  Berniece Salines, DO  Electrophysiologist:  None   Evaluation Performed:  Follow up visit  Chief Complaint: Worsening shortness of breath and chest pain  History of Present Illness:    Shannon Santiago is a 55 y.o. female with hypertension, pre-, anxiety and depression, hyperlipidemia, obesity here today for follow-up visit.  Last saw the patient in September 2020 at that time she has some shortness of breath with an echocardiogram which showed diastolic dysfunction with no wall motion abnormality EF was normal.  Since her visit she had been doing well she tells me that she was struggling with her depression and ended up inpatient for treatment for this during that time there was noted abnormal EKG concerning for inferior wall infarction.  She admits that for the last year or so she has had progressively worsening sensation.  Recently she has had intermittent chest pain.  She described as a sometimes left achy feeling which last for few seconds and resolve.  Sometimes she feels it on the right.  Nothing makes it better or worse.  She is concerned  The patient does not have symptoms concerning for COVID-19 infection.  Past Medical History:  Diagnosis Date  . Allergy   . Anxiety   . Asthma   . Depression   . Family history of breast cancer   . Family history of colon cancer   . Family history of lung cancer   . Family history of prostate cancer   . Hyperlipidemia   . Hypertension   . Hypothyroidism   . Prolonged PTT (partial thromboplastin time) 06/23/2019   Normal PT  . Thyroid disease    Past Surgical History:  Procedure Laterality Date  . ENDOMETRIAL BIOPSY  06/21/2019  . EYE SURGERY     x 5  . HYSTEROSCOPY WITH D & C N/A 10/27/2019   Procedure: DILATATION AND  CURETTAGE /HYSTEROSCOPY;  Surgeon: Emily Filbert, MD;  Location: [place];  Service: Gynecology;  Laterality: N/A;     Current Meds  Medication Sig  . Albuterol Sulfate (PROAIR RESPICLICK) 831 (90 Base) MCG/ACT AEPB Inhale 1-2 puffs into the lungs every 4 (four) hours as needed.  . ALPRAZolam (XANAX) 0.5 MG tablet Take 0.5-1 tablets (0.25-0.5 mg total) by mouth 2 (two) times daily as needed for anxiety.  Marland Kitchen amLODipine (NORVASC) 5 MG tablet TAKE 1 TABLET BY MOUTH ONCE DAILY  . Ascorbic Acid (VITAMIN C) 1000 MG tablet Take 1,000 mg by mouth daily.  Marland Kitchen atorvastatin (LIPITOR) 20 MG tablet Take 1 tablet (20 mg total) by mouth daily.  Marland Kitchen b complex vitamins tablet Take 1 tablet by mouth daily.  . busPIRone (BUSPAR) 15 MG tablet Take 1 tablet (15 mg total) by mouth 2 (two) times daily.  Marland Kitchen CALCIUM PO Take 500 mg by mouth daily.  . cetirizine (ZYRTEC) 10 MG tablet Take 10 mg by mouth daily.  . Cholecalciferol (VITAMIN D3) 10 MCG (400 UNIT) tablet Take 400 Units by mouth daily.  . DULoxetine (CYMBALTA) 30 MG capsule Take 1 capsule (30 mg total) by mouth daily at 6 PM.  . DULoxetine (CYMBALTA) 60 MG capsule Take 1 capsule (60 mg total) by mouth daily.  Marland Kitchen EPINEPHrine 0.3 mg/0.3 mL IJ SOAJ injection Inject 0.3 mLs (  0.3 mg total) into the muscle once as needed (anaphylaxis/allergic reaction).  . Ferrous Sulfate (IRON PO) Take 325 mg by mouth daily.  . fluticasone (FLONASE) 50 MCG/ACT nasal spray Place 1 spray into both nostrils daily.  . Fluticasone-Salmeterol (ADVAIR DISKUS) 250-50 MCG/DOSE AEPB Inhale 1 puff into the lungs 2 (two) times daily.  . Ginger, Zingiber officinalis, (GINGER ROOT PO) Take by mouth.  . levothyroxine (SYNTHROID) 75 MCG tablet Take 1 tablet (75 mcg total) by mouth daily before breakfast. Will be due for labs around end of December 2021, early January 2022  . lisinopril (ZESTRIL) 20 MG tablet TAKE 1 TABLET BY MOUTH ONCE DAILY  . lurasidone (LATUDA) 40 MG TABS tablet Take  1 tablet (40 mg total) by mouth daily at 6 PM.  . Magnesium 500 MG CAPS Take 500 mg by mouth daily.  . Multiple Vitamin (MULTIVITAMIN) capsule Take 1 capsule by mouth daily.  . Omega-3 Fatty Acids (FISH OIL) 1000 MG CAPS Take 1 capsule by mouth 2 (two) times daily.  Marland Kitchen omeprazole (PRILOSEC) 40 MG capsule Take 1 capsule (40 mg total) by mouth daily.  . Probiotic Product (PROBIOTIC ADVANCED PO) Take 1 capsule by mouth daily.  . RESTASIS 0.05 % ophthalmic emulsion 1 drop 2 (two) times daily.  . traZODone (DESYREL) 50 MG tablet Take 1 tablet (50 mg total) by mouth at bedtime.  . [DISCONTINUED] atorvastatin (LIPITOR) 10 MG tablet Take 1 tablet (10 mg total) by mouth daily.     Allergies:   Morphine and related, Peanut oil, Penicillins, Polymyxin b-trimethoprim, Abilify [aripiprazole], Citalopram, Food, Prednisone, and Singulair [montelukast sodium]   Social History   Tobacco Use  . Smoking status: Never Smoker  . Smokeless tobacco: Never Used  Vaping Use  . Vaping Use: Never used  Substance Use Topics  . Alcohol use: Yes    Comment: occ  . Drug use: Never     Family Hx: The patient's family history includes Alcohol abuse in her mother; Anxiety disorder in her mother; Breast cancer in her cousin; Colon cancer in her maternal grandmother; Depression in her mother; Diabetes in her brother and father; Hypertension in her brother, father, and mother; Lung cancer (age of onset: 35) in her mother; Prostate cancer (age of onset: 103) in her father; Prostate cancer (age of onset: 32) in her paternal grandfather; Skin cancer in her father; Stroke in her mother; Thyroid disease in her maternal aunt and maternal uncle.  ROS:   Review of Systems  Constitution: Negative for decreased appetite, fever and weight gain.  HENT: Negative for congestion, ear discharge, hoarse voice and sore throat.   Eyes: Negative for discharge, redness, vision loss in right eye and visual halos.  Cardiovascular: Reports  chest pain, dyspnea on exertion.  Negative for leg swelling, orthopnea and palpitations.  Respiratory: Negative for cough, hemoptysis, shortness of breath and snoring.   Endocrine: Negative for heat intolerance and polyphagia.  Hematologic/Lymphatic: Negative for bleeding problem. Does not bruise/bleed easily.  Skin: Negative for flushing, nail changes, rash and suspicious lesions.  Musculoskeletal: Negative for arthritis, joint pain, muscle cramps, myalgias, neck pain and stiffness.  Gastrointestinal: Negative for abdominal pain, bowel incontinence, diarrhea and excessive appetite.  Genitourinary: Negative for decreased libido, genital sores and incomplete emptying.  Neurological: Negative for brief paralysis, focal weakness, headaches and loss of balance.  Psychiatric/Behavioral: Negative for altered mental status, depression and suicidal ideas.  Allergic/Immunologic: Negative for HIV exposure and persistent infections.   Prior CV studies:   The following  studies were reviewed today:  Transthoracic echocardiogram IMPRESSIONS  1. The left ventricle has normal systolic function with an ejection  fraction of 60-65%. The cavity size was normal. Left ventricular diastolic  Doppler parameters are consistent with impaired relaxation.  2. The right ventricle has normal systolic function. The cavity was  normal. There is no increase in right ventricular wall thickness.  3. The aorta is normal unless otherwise noted.   FINDINGS  Left Ventricle: The left ventricle has normal systolic function, with an  ejection fraction of 60-65%. The cavity size was normal. There is no  increase in left ventricular wall thickness. Left ventricular diastolic  Doppler parameters are consistent with  impaired relaxation.   Right Ventricle: The right ventricle has normal systolic function. The  cavity was normal. There is no increase in right ventricular wall  thickness.   Left Atrium: Left atrial size was  normal in size.   Right Atrium: Right atrial size was normal in size. Right atrial pressure  is estimated at 3 mmHg.   Interatrial Septum: No atrial level shunt detected by color flow Doppler.   Pericardium: There is no evidence of pericardial effusion.   Mitral Valve: The mitral valve is normal in structure. Mitral valve  regurgitation is trivial by color flow Doppler.   Tricuspid Valve: The tricuspid valve is normal in structure. Tricuspid  valve regurgitation is trivial by color flow Doppler.   Aortic Valve: The aortic valve is normal in structure. Aortic valve  regurgitation was not assessed by color flow Doppler.   Pulmonic Valve: The pulmonic valve was normal in structure. Pulmonic valve  regurgitation was not assessed by color flow Doppler.   Aorta: The aorta is normal unless otherwise noted.   Venous: The inferior vena cava measures 2.18 cm, is normal in size with  greater than 50% respiratory variability.    Labs/Other Tests and Data Reviewed:    EKG: None today but was able to review her EKG which was done on November 09, 2020.  Recent Labs: 11/07/2020: ALT 35; BUN 23; Creatinine, Ser 0.93; Potassium 4.0; Sodium 138 11/08/2020: TSH 3.634 11/09/2020: Hemoglobin 15.1; Platelets 434   Recent Lipid Panel Lab Results  Component Value Date/Time   CHOL 205 (H) 11/08/2020 05:53 PM   CHOL 265 (H) 09/09/2019 11:41 AM   TRIG 88 11/08/2020 05:53 PM   HDL 50 11/08/2020 05:53 PM   HDL 43 09/09/2019 11:41 AM   CHOLHDL 4.1 11/08/2020 05:53 PM   LDLCALC 137 (H) 11/08/2020 05:53 PM   LDLCALC 196 (H) 08/11/2020 09:09 AM    Wt Readings from Last 3 Encounters:  12/06/20 188 lb (85.3 kg)  11/02/20 188 lb (85.3 kg)  10/02/20 188 lb (85.3 kg)     Objective:    Vital Signs:  Pulse (!) 103   Ht 5\' 1"  (1.549 m)   Wt 188 lb (85.3 kg)   LMP 06/18/2017   BMI 35.52 kg/m    None today  ASSESSMENT & PLAN:    1. Chest pain 2. Dyspnea on exertion 3. Abnormal  EKG 4. Prediabetes 5. Hypertension 6. Hyperlipidemia  Her chest pain and dyspnea on exertion is concerning in the presence of her abnormal EKG the patient does have intermediate risk for coronary artery disease would not like to do is pursue an ischemic evaluation in this patient.  Shared decision a coronary CTA at this time is appropriate.  I have discussed with the patient about the testing.  The patient has no IV contrast  allergy and is agreeable to proceed with this test.  Blood pressure is acceptable, continue with current antihypertensive regimen  Hyperlipidemia-I like to increase her Lipitor to 20 mg daily.    COVID-19 Education: The signs and symptoms of COVID-19 were discussed with the patient and how to seek care for testing (follow up with PCP or arrange E-visit).  The importance of social distancing was discussed today.  Time:   Today, I have spent 7 minutes with the patient with telehealth technology discussing the above problems.     Medication Adjustments/Labs and Tests Ordered: Current medicines are reviewed at length with the patient today.  Concerns regarding medicines are outlined above.   Tests Ordered: Orders Placed This Encounter  Procedures  . CT CORONARY MORPH W/CTA COR W/SCORE W/CA W/CM &/OR WO/CM  . CT CORONARY FRACTIONAL FLOW RESERVE DATA PREP  . CT CORONARY FRACTIONAL FLOW RESERVE FLUID ANALYSIS    Medication Changes: Meds ordered this encounter  Medications  . atorvastatin (LIPITOR) 20 MG tablet    Sig: Take 1 tablet (20 mg total) by mouth daily.    Dispense:  90 tablet    Refill:  3    Follow Up: 3 months Signed, Berniece Salines, DO  12/06/2020 10:08 AM    [place]

## 2020-12-06 NOTE — Patient Instructions (Signed)
Medication Instructions:  Your physician has recommended you make the following change in your medication:  INCREASE LIPITOR TO 20 MG ONCE DAILY  *If you need a refill on your cardiac medications before your next appointment, please call your pharmacy*   Lab Work: NONE If you have labs (blood work) drawn today and your tests are completely normal, you will receive your results only by: Marland Kitchen MyChart Message (if you have MyChart) OR . A paper copy in the mail If you have any lab test that is abnormal or we need to change your treatment, we will call you to review the results.   Testing/Procedures: Please arrive at the [facility] main entrance of [facility] at xx:xx AM (30-45 minutes prior to test start time)  [facility] [place], [place] [address] [phone]  Proceed to the [facility] Radiology Department (First Floor).  Please follow these instructions carefully (unless otherwise directed):  On the Night Before the Test: . Drink plenty of water. . Do not consume any caffeinated/decaffeinated beverages or chocolate 12 hours prior to your test. . Do not take any antihistamines 12 hours prior to your test. On the Day of the Test: . Drink plenty of water. Do not drink any water within one hour of the test. . Do not eat any food 4 hours prior to the test. . You may take your regular medications prior to the test. . IF NOT ON A BETA BLOCKER - Take 100 mg of lopressor (metoprolol) two hours before the test.    After the Test: . Drink plenty of water. . After receiving IV contrast, you may experience a mild flushed feeling. This is normal. . On occasion, you may experience a mild rash up to 24 hours after the test. This is not dangerous. If this occurs, you can take Benadryl 25 mg and increase your fluid intake. . If you experience trouble breathing, this can be serious. If it is severe call 911 IMMEDIATELY. If it is mild, please call our  office. . If you take any of these medications: Glipizide/Metformin, Avandament, Glucavance, please do not take 48 hours after completing test.    Follow-Up: At [facility], you and your health needs are our priority.  As part of our continuing mission to provide you with exceptional heart care, we have created designated Provider Care Teams.  These Care Teams include your primary Cardiologist (physician) and Advanced Practice Providers (APPs -  Physician Assistants and Nurse Practitioners) who all work together to provide you with the care you need, when you need it.  We recommend signing up for the patient portal called "MyChart".  Sign up information is provided on this After Visit Summary.  MyChart is used to connect with patients for Virtual Visits (Telemedicine).  Patients are able to view lab/test results, encounter notes, upcoming appointments, etc.  Non-urgent messages can be sent to your provider as well.   To learn more about what you can do with MyChart, go to [email or web address].    Your next appointment:   3 month(s)  The format for your next appointment:   In Person  Provider:   Berniece Salines, DO   Other Instructions  Cardiac CT Angiogram A cardiac CT angiogram is a procedure to look at the heart and the area around the heart. It may be done to help find the cause of chest pains or other symptoms of heart disease. During this procedure, a substance called contrast dye is injected  into the blood vessels in the area to be checked. A large X-ray machine, called a CT scanner, then takes detailed pictures of the heart and the surrounding area. The procedure is also sometimes called a coronary CT angiogram, coronary artery scanning, or CTA. A cardiac CT angiogram allows the health care provider to see how well blood is flowing to and from the heart. The health care provider will be able to see if there are any problems, such as:  Blockage or narrowing of the coronary arteries  in the heart.  Fluid around the heart.  Signs of weakness or disease in the muscles, valves, and tissues of the heart. Tell a health care provider about:  Any allergies you have. This is especially important if you have had a previous allergic reaction to contrast dye.  All medicines you are taking, including vitamins, herbs, eye drops, creams, and over-the-counter medicines.  Any blood disorders you have.  Any surgeries you have had.  Any medical conditions you have.  Whether you are pregnant or may be pregnant.  Any anxiety disorders, chronic pain, or other conditions you have that may increase your stress or prevent you from lying still. What are the risks? Generally, this is a safe procedure. However, problems may occur, including:  Bleeding.  Infection.  Allergic reactions to medicines or dyes.  Damage to other structures or organs.  Kidney damage from the contrast dye that is used.  Increased risk of cancer from radiation exposure. This risk is low. Talk with your health care provider about: ? The risks and benefits of testing. ? How you can receive the lowest dose of radiation. What happens before the procedure?  Wear comfortable clothing and remove any jewelry, glasses, dentures, and hearing aids.  Follow instructions from your health care provider about eating and drinking. This may include: ? For 12 hours before the procedure -- avoid caffeine. This includes tea, coffee, soda, energy drinks, and diet pills. Drink plenty of water or other fluids that do not have caffeine in them. Being well hydrated can prevent complications. ? For 4-6 hours before the procedure -- stop eating and drinking. The contrast dye can cause nausea, but this is less likely if your stomach is empty.  Ask your health care provider about changing or stopping your regular medicines. This is especially important if you are taking diabetes medicines, blood thinners, or medicines to treat  problems with erections (erectile dysfunction). What happens during the procedure?  Hair on your chest may need to be removed so that small sticky patches called electrodes can be placed on your chest. These will transmit information that helps to monitor your heart during the procedure.  An IV will be inserted into one of your veins.  You might be given a medicine to control your heart rate during the procedure. This will help to ensure that good images are obtained.  You will be asked to lie on an exam table. This table will slide in and out of the CT machine during the procedure.  Contrast dye will be injected into the IV. You might feel warm, or you may get a metallic taste in your mouth.  You will be given a medicine called nitroglycerin. This will relax or dilate the arteries in your heart.  The table that you are lying on will move into the CT machine tunnel for the scan.  The person running the machine will give you instructions while the scans are being done. You may be asked  to: ? Keep your arms above your head. ? Hold your breath. ? Stay very still, even if the table is moving.  When the scanning is complete, you will be moved out of the machine.  The IV will be removed. The procedure may vary among health care providers and hospitals.   What can I expect after the procedure? After your procedure, it is common to have:  A metallic taste in your mouth from the contrast dye.  A feeling of warmth.  A headache from the nitroglycerin. Follow these instructions at home:  Take over-the-counter and prescription medicines only as told by your health care provider.  If you are told, drink enough fluid to keep your urine pale yellow. This will help to flush the contrast dye out of your body.  Most people can return to their normal activities right after the procedure. Ask your health care provider what activities are safe for you.  It is up to you to get the results of your  procedure. Ask your health care provider, or the department that is doing the procedure, when your results will be ready.  Keep all follow-up visits as told by your health care provider. This is important. Contact a health care provider if:  You have any symptoms of allergy to the contrast dye. These include: ? Shortness of breath. ? Rash or hives. ? A racing heartbeat. Summary  A cardiac CT angiogram is a procedure to look at the heart and the area around the heart. It may be done to help find the cause of chest pains or other symptoms of heart disease.  During this procedure, a large X-ray machine, called a CT scanner, takes detailed pictures of the heart and the surrounding area after a contrast dye has been injected into blood vessels in the area.  Ask your health care provider about changing or stopping your regular medicines before the procedure. This is especially important if you are taking diabetes medicines, blood thinners, or medicines to treat erectile dysfunction.  If you are told, drink enough fluid to keep your urine pale yellow. This will help to flush the contrast dye out of your body. This information is not intended to replace advice given to you by your health care provider. Make sure you discuss any questions you have with your health care provider. Document Revised: 06/02/2019 Document Reviewed: 06/02/2019 Elsevier Patient Education  2021 [employer].

## 2020-12-07 ENCOUNTER — Other Ambulatory Visit: Payer: Self-pay | Admitting: Osteopathic Medicine

## 2020-12-07 DIAGNOSIS — E063 Autoimmune thyroiditis: Secondary | ICD-10-CM

## 2020-12-07 MED FILL — LEVOTHYROXINE SODIUM 75 MCG: 75 | 90 days supply | Qty: 90 | Fill #0

## 2020-12-08 NOTE — Telephone Encounter (Signed)
Museum/gallery conservator, my apologies for the late reply on this 1, I sent a MyChart message to the patient, if she responds to [place] that is still having symptoms we can try to get her set up for a VQ scan.  I pended the order

## 2020-12-08 NOTE — Telephone Encounter (Signed)
Disregard, this is not the correct patient!

## 2020-12-08 NOTE — Addendum Note (Signed)
Addended by: Maryla Morrow on: 12/08/2020 12:41 PM   Modules accepted: Orders

## 2020-12-18 ENCOUNTER — Telehealth (HOSPITAL_COMMUNITY): Payer: Self-pay | Admitting: *Deleted

## 2020-12-18 NOTE — Telephone Encounter (Signed)
Reaching out to patient to offer assistance regarding upcoming cardiac imaging study; pt verbalizes understanding of appt date/time, parking situation and where to check in, pre-test NPO status and medications ordered, and verified current allergies; name and call back number provided for further questions should they arise  Dannica Bickham RN Navigator Cardiac Imaging [facility] [phone] office [phone] cell  

## 2020-12-20 ENCOUNTER — Encounter (HOSPITAL_COMMUNITY): Payer: Self-pay

## 2020-12-20 ENCOUNTER — Ambulatory Visit (HOSPITAL_COMMUNITY)
Admission: RE | Admit: 2020-12-20 | Discharge: 2020-12-20 | Disposition: A | Discharge status: Home / Self Care | Payer: No Typology Code available for payment source | Source: Ambulatory Visit | Attending: Internal Medicine | Admitting: Internal Medicine

## 2020-12-20 ENCOUNTER — Other Ambulatory Visit: Payer: Self-pay

## 2020-12-20 DIAGNOSIS — R079 Chest pain, unspecified: Secondary | ICD-10-CM | POA: Insufficient documentation

## 2020-12-20 MED ORDER — METOPROLOL TARTRATE 5 MG/5ML IV SOLN: 5.0000 mg | INTRAVENOUS | Status: DC | PRN

## 2020-12-20 MED ORDER — NITROGLYCERIN 0.4 MG SL SUBL
0.8000 mg | SUBLINGUAL_TABLET | Freq: Once | SUBLINGUAL | Status: AC
  Administered 2020-12-20: 0.8 mg via SUBLINGUAL

## 2020-12-20 MED ORDER — IOHEXOL 350 MG/ML SOLN
100.0000 mL | Freq: Once | INTRAVENOUS | Status: AC | PRN
  Administered 2020-12-20: 100 mL via INTRAVENOUS

## 2020-12-20 MED ORDER — METOPROLOL TARTRATE 5 MG/5ML IV SOLN
INTRAVENOUS | Status: AC
  Administered 2020-12-20: 5 mg via INTRAVENOUS
  Filled 2020-12-20: qty 5

## 2020-12-20 MED ORDER — NITROGLYCERIN 0.4 MG SL SUBL
SUBLINGUAL_TABLET | SUBLINGUAL | Status: AC
  Filled 2020-12-20: qty 2

## 2020-12-21 DIAGNOSIS — R079 Chest pain, unspecified: Secondary | ICD-10-CM | POA: Diagnosis not present

## 2020-12-28 ENCOUNTER — Encounter (HOSPITAL_COMMUNITY): Payer: Self-pay | Admitting: Psychiatry

## 2020-12-28 ENCOUNTER — Telehealth (INDEPENDENT_AMBULATORY_CARE_PROVIDER_SITE_OTHER): Payer: No Typology Code available for payment source | Admitting: Psychiatry

## 2020-12-28 ENCOUNTER — Other Ambulatory Visit (HOSPITAL_COMMUNITY): Payer: Self-pay | Admitting: Psychiatry

## 2020-12-28 DIAGNOSIS — F411 Generalized anxiety disorder: Secondary | ICD-10-CM

## 2020-12-28 DIAGNOSIS — F333 Major depressive disorder, recurrent, severe with psychotic symptoms: Secondary | ICD-10-CM | POA: Diagnosis not present

## 2020-12-28 MED ORDER — DULOXETINE HCL 30 MG PO CPEP: 30.0000 mg | ORAL_CAPSULE | Freq: Every day | ORAL | 2 refills | Status: DC

## 2020-12-28 MED ORDER — LURASIDONE HCL 40 MG PO TABS: 40.0000 mg | ORAL_TABLET | Freq: Every day | ORAL | 2 refills | Status: DC

## 2020-12-28 MED FILL — LATUDA 40 MG TABLET: 40 | 30 days supply | Qty: 30 | Fill #0

## 2020-12-28 MED FILL — DULoxetine HCL 30 MG CPEP: 30 | 30 days supply | Qty: 30 | Fill #0

## 2020-12-28 NOTE — Progress Notes (Signed)
[place] Follow up visit   Patient Identification: Shannon Santiago MRN:  [id number] Date of Evaluation:  12/28/2020 Referral Source: [facility] discharge Chief Complaint:  establish care, depression Visit Diagnosis:    ICD-10-CM   1. MDD (major depressive disorder), recurrent, severe, with psychosis ([place])  F33.3   2. GAD (generalized anxiety disorder)  F41.1    Virtual Visit via Video Note  I connected with Shannon Santiago on 12/28/20 at  4:00 PM EST by a video enabled telemedicine application and verified that I am speaking with the correct person using two identifiers.  Location: Patient: home Provider: office   I discussed the limitations of evaluation and management by telemedicine and the availability of in person appointments. The patient expressed understanding and agreed to proceed.     I discussed the assessment and treatment plan with the patient. The patient was provided an opportunity to ask questions and all were answered. The patient agreed with the plan and demonstrated an understanding of the instructions.   The patient was advised to call back or seek an in-person evaluation if the symptoms worsen or if the condition fails to improve as anticipated.  I provided 10  minutes of non-face-to-face time during this encounter.   [name], MD     History of Present Illness: Patient is a 55 years old currently single Caucasian female initially referred after hospital discharge January 21 she was admitted for 4 days. 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 she lives with her Shannon Santiago does not have any kids   Not smelling smoke now which would remind her of her Shannon Santiago who died 6 years ago and was a smoker  abilify didn't help but latuda at 40mg  is helping, depression better, not smelling Shannon Santiago was sick in hospital but back Feels better and anxiety improved   Feels med helping including cymbalta now at 69 plus 30mg    Aggravating factors;  grief of Shannon Santiago dying 6 years ago,lonliness, Shannon Santiago sick Modifying factors: pets Duration since young age  Alcohol abuse or using drugs denies Associated Signs/Symptoms:   Past Psychiatric History: depression, anxity  Previous Psychotropic Medications: Yes   Substance Abuse History in the last 12 months:  No.  Consequences of Substance Abuse: NA  Past Medical History:  Past Medical History:  Diagnosis Date  . Allergy   . Anxiety   . Asthma   . Depression   . Family history of breast cancer   . Family history of colon cancer   . Family history of lung cancer   . Family history of prostate cancer   . Hyperlipidemia   . Hypertension   . Hypothyroidism   . Prolonged PTT (partial thromboplastin time) 06/23/2019   Normal PT  . Thyroid disease     Past Surgical History:  Procedure Laterality Date  . ENDOMETRIAL BIOPSY  06/21/2019  . EYE SURGERY     x 5  . HYSTEROSCOPY WITH D & C N/A 10/27/2019   Procedure: DILATATION AND CURETTAGE /HYSTEROSCOPY;  Surgeon: Emily Filbert, MD;  Location: [place];  Service: Gynecology;  Laterality: N/A;    Family Psychiatric History: Shannon Santiago depression, anxiety, alcohol use  Family History:  Family History  Problem Relation Age of Onset  . Hypertension Mother   . Stroke Mother   . Lung cancer Mother 49  . Anxiety disorder Mother   . Depression Mother   . Alcohol abuse Mother   . Skin cancer Father   . Prostate  cancer Father 64  . Hypertension Father   . Diabetes Father   . Thyroid disease Maternal Aunt   . Thyroid disease Maternal Uncle   . Diabetes Brother   . Hypertension Brother   . Colon cancer Maternal Grandmother        dx 80s, d. 24s  . Prostate cancer Paternal Grandfather 83  . Breast cancer Cousin        dx 20s-30s    Social History:   Social History   Socioeconomic History  . Marital status: Single    Spouse name: Not on file  . Number of children: Not on file  . Years of education: Not on file  .  Highest education level: Not on file  Occupational History  . Not on file  Tobacco Use  . Smoking status: Never Smoker  . Smokeless tobacco: Never Used  Vaping Use  . Vaping Use: Never used  Substance and Sexual Activity  . Alcohol use: Yes    Comment: occ  . Drug use: Never  . Sexual activity: Not Currently    Birth control/protection: Abstinence  Other Topics Concern  . Not on file  Social History Narrative  . Not on file   Social Determinants of Health   Financial Resource Strain: Not on file  Food Insecurity: Not on file  Transportation Needs: Not on file  Physical Activity: Not on file  Stress: Not on file  Social Connections: Not on file     Allergies:   Allergies  Allergen Reactions  . Morphine And Related Other (See Comments)    Numb hands and feet  . Peanut Oil Other (See Comments)    Stomach cramping, hand/foot swelling  . Penicillins Hives  . Polymyxin B-Trimethoprim Other (See Comments), Photosensitivity and Swelling  . Abilify [Aripiprazole] Other (See Comments)    Suicidal thoughts and actions  . Citalopram Other (See Comments)    SSRI's ineffective - Citalopram, Fluoxetine, Sertraline, Excitalopram   . Food Nausea Only, Swelling, Palpitations, Other (See Comments) and Cough    WHEAT  . Prednisone Anxiety, Nausea Only, Other (See Comments) and Palpitations  . Singulair [Montelukast Sodium] Other (See Comments)    Mood changes, worsening depression    Metabolic Disorder Labs: Lab Results  Component Value Date   HGBA1C 5.8 (H) 11/08/2020   MPG 119.76 11/08/2020   MPG 126 08/11/2020   Lab Results  Component Value Date   PROLACTIN 9.2 11/09/2020   PROLACTIN 10.6 06/18/2019   Lab Results  Component Value Date   CHOL 205 (H) 11/08/2020   TRIG 88 11/08/2020   HDL 50 11/08/2020   CHOLHDL 4.1 11/08/2020   VLDL 18 11/08/2020   LDLCALC 137 (H) 11/08/2020   LDLCALC 196 (H) 08/11/2020   Lab Results  Component Value Date   TSH 3.634 11/08/2020     Therapeutic Level Labs: No results found for: LITHIUM No results found for: CBMZ No results found for: VALPROATE  Current Medications: Current Outpatient Medications  Medication Sig Dispense Refill  . Albuterol Sulfate (PROAIR RESPICLICK) 297 (90 Base) MCG/ACT AEPB Inhale 1-2 puffs into the lungs every 4 (four) hours as needed. 1 each 4  . ALPRAZolam (XANAX) 0.5 MG tablet Take 0.5-1 tablets (0.25-0.5 mg total) by mouth 2 (two) times daily as needed for anxiety. 15 tablet 0  . amLODipine (NORVASC) 5 MG tablet TAKE 1 TABLET BY MOUTH ONCE DAILY 90 tablet 1  . Ascorbic Acid (VITAMIN C) 1000 MG tablet Take 1,000 mg by mouth daily.    Marland Kitchen  atorvastatin (LIPITOR) 20 MG tablet Take 1 tablet (20 mg total) by mouth daily. 90 tablet 3  . b complex vitamins tablet Take 1 tablet by mouth daily.    . busPIRone (BUSPAR) 15 MG tablet Take 1 tablet (15 mg total) by mouth 2 (two) times daily. 180 tablet 3  . CALCIUM PO Take 500 mg by mouth daily.    . cetirizine (ZYRTEC) 10 MG tablet Take 10 mg by mouth daily.    . Cholecalciferol (VITAMIN D3) 10 MCG (400 UNIT) tablet Take 400 Units by mouth daily.    . DULoxetine (CYMBALTA) 30 MG capsule Take 1 capsule (30 mg total) by mouth daily at 6 PM. 30 capsule 2  . DULoxetine (CYMBALTA) 60 MG capsule Take 1 capsule (60 mg total) by mouth daily. 30 capsule 0  . EPINEPHrine 0.3 mg/0.3 mL IJ SOAJ injection Inject 0.3 mLs (0.3 mg total) into the muscle once as needed (anaphylaxis/allergic reaction). 1 each 3  . Ferrous Sulfate (IRON PO) Take 325 mg by mouth daily.    . fluticasone (FLONASE) 50 MCG/ACT nasal spray Place 1 spray into both nostrils daily. 16 g 5  . Fluticasone-Salmeterol (ADVAIR DISKUS) 250-50 MCG/DOSE AEPB Inhale 1 puff into the lungs 2 (two) times daily. 60 each 3  . Ginger, Zingiber officinalis, (GINGER ROOT PO) Take by mouth.    . levothyroxine (SYNTHROID) 75 MCG tablet TAKE 1 TABLET BY MOUTH DAILY BEFORE BREAKFAST **WILL BE DUE FOR LABS IN EITHER  DECEMBER 2021 OR JANUARY 2022** 90 tablet 0  . lisinopril (ZESTRIL) 20 MG tablet TAKE 1 TABLET BY MOUTH ONCE DAILY 90 tablet 1  . lurasidone (LATUDA) 40 MG TABS tablet Take 1 tablet (40 mg total) by mouth daily at 6 PM. 30 tablet 2  . Magnesium 500 MG CAPS Take 500 mg by mouth daily.    . metoprolol tartrate (LOPRESSOR) 100 MG tablet Take Metoprolol 100 mg 2 hours prior to CT 1 tablet 0  . Multiple Vitamin (MULTIVITAMIN) capsule Take 1 capsule by mouth daily.    . Omega-3 Fatty Acids (FISH OIL) 1000 MG CAPS Take 1 capsule by mouth 2 (two) times daily.    Marland Kitchen omeprazole (PRILOSEC) 40 MG capsule Take 1 capsule (40 mg total) by mouth daily. 30 capsule 2  . Probiotic Product (PROBIOTIC ADVANCED PO) Take 1 capsule by mouth daily.    . RESTASIS 0.05 % ophthalmic emulsion 1 drop 2 (two) times daily.    . traZODone (DESYREL) 50 MG tablet Take 1 tablet (50 mg total) by mouth at bedtime. 30 tablet 0   No current facility-administered medications for this visit.     Psychiatric Specialty Exam: Review of Systems  Cardiovascular: Negative for chest pain.  Psychiatric/Behavioral: Negative for agitation and self-injury.    Last menstrual period 06/18/2017.There is no height or weight on file to calculate BMI.  General Appearance: Casual  Eye Contact:  Fair  Speech:  Clear and Coherent  Volume:  Normal  Mood:  fair  Affect:  Congruent  Thought Process:  Goal Directed  Orientation:  Full (Time, Place, and Person)  Thought Content:  Rumination  Suicidal Thoughts:  No  Homicidal Thoughts:  No  Memory:  Immediate;   Fair Recent;   Fair  Judgement:  Fair  Insight:  Fair  Psychomotor Activity:  Normal  Concentration:  Concentration: Fair and Attention Span: Fair  Recall:  [employer] of Knowledge:Good  Language: Good  Akathisia:  No  Handed:    AIMS (if  indicated):  No involuntary movements  Assets:  Desire for Improvement  ADL's:  Intact  Cognition: WNL  Sleep:  variable to fair    Screenings: AUDIT   Flowsheet Row Admission (Discharged) from 11/08/2020 in [facility] [facility]  Alcohol Use Disorder Identification Test Final Score (AUDIT) 3    GAD-7   Flowsheet Row Video Visit from 09/11/2020 in [place] Video Visit from 08/25/2020 in [place] Office Visit from 08/11/2020 in [place] Visit from 11/24/2019 in [place] Video Visit from 09/10/2019 in [place]  Total GAD-7 Score 16 16 16 15 16     PHQ2-9   Flowsheet Row Video Visit from 11/30/2020 in [place] Video Visit from 09/11/2020 in [place] Video Visit from 08/25/2020 in [place] Office Visit from 08/11/2020 in [place] Office Visit from 11/24/2019 in [place]  PHQ-2 Total Score 3 4 6 6 4   PHQ-9 Total Score 9 20 23 25 13     Flowsheet Row Video Visit from 12/28/2020 in [place] Video Visit from 11/30/2020 in [place] Admission (Discharged) from 11/08/2020 in [place] [facility]  C-SSRS RISK CATEGORY Error: Q3, 4, or 5 should not be populated when Q2 is No Error: Q3, 4, or 5 should not be populated when Q2 is No Moderate Risk      Assessment and Plan: as follows  Major depressive disorder recurrent severe with psychotic features;better, not smelling smoke Continue latuda, some tremors but minimal, doesn't want to add any medicine and wants to observe  Generalized anxiety disorder;  manageble on cymbalta , continue   Fu 67m.    [name], MD 3/10/20224:24 PM

## 2021-01-01 ENCOUNTER — Other Ambulatory Visit: Payer: Self-pay | Admitting: Osteopathic Medicine

## 2021-01-01 DIAGNOSIS — I10 Essential (primary) hypertension: Secondary | ICD-10-CM

## 2021-02-05 MED FILL — Lurasidone HCl Tab 40 MG: ORAL | 30 days supply | Qty: 30 | Fill #0 | Status: AC

## 2021-02-05 MED FILL — Duloxetine HCl Enteric Coated Pellets Cap 30 MG (Base Eq): ORAL | 30 days supply | Qty: 30 | Fill #0 | Status: AC

## 2021-02-06 ENCOUNTER — Other Ambulatory Visit (HOSPITAL_BASED_OUTPATIENT_CLINIC_OR_DEPARTMENT_OTHER): Payer: Self-pay

## 2021-02-13 ENCOUNTER — Encounter: Payer: Self-pay | Admitting: Osteopathic Medicine

## 2021-02-16 ENCOUNTER — Other Ambulatory Visit: Payer: Self-pay

## 2021-02-16 ENCOUNTER — Encounter: Payer: Self-pay | Admitting: Family Medicine

## 2021-02-16 ENCOUNTER — Ambulatory Visit (INDEPENDENT_AMBULATORY_CARE_PROVIDER_SITE_OTHER): Payer: No Typology Code available for payment source | Admitting: Family Medicine

## 2021-02-16 VITALS — BP 118/75 | HR 79 | Temp 98.2°F | Wt 177.0 lb

## 2021-02-16 DIAGNOSIS — S09301A Unspecified injury of right middle and inner ear, initial encounter: Secondary | ICD-10-CM | POA: Diagnosis not present

## 2021-02-16 NOTE — Progress Notes (Signed)
Acute Office Visit  Subjective:    Patient ID: Shannon Santiago, female    DOB: 14-Feb-1966, 55 y.o.   MRN: [id number]  Chief Complaint  Patient presents with  . Ear Pain    HPI Patient is in today for right ear pain.  Patient states that on  Tuesday she was cleaning her ears with a qtip and hurt her right ear causing some bleeding and loss of hearing. She state the pain at the time of the incident was about 4-10 sharp pain that has since resolved, however she did have complete loss of hearing initially. Hearing is improving, though still diminished today. Yesterday her right ear was able to "pop" and that relieved some of the pressure and clogged sensation which made it feel better and help her hearing improve.   She has not had any headaches, facial pain, discharge from ears or nose, dizziness/vertigo.  Past Medical History:  Diagnosis Date  . Allergy   . Anxiety   . Asthma   . Depression   . Family history of breast cancer   . Family history of colon cancer   . Family history of lung cancer   . Family history of prostate cancer   . Hyperlipidemia   . Hypertension   . Hypothyroidism   . Prolonged PTT (partial thromboplastin time) 06/23/2019   Normal PT  . Thyroid disease     Past Surgical History:  Procedure Laterality Date  . ENDOMETRIAL BIOPSY  06/21/2019  . EYE SURGERY     x 5  . HYSTEROSCOPY WITH D & C N/A 10/27/2019   Procedure: DILATATION AND CURETTAGE /HYSTEROSCOPY;  Surgeon: Allie Bossier, MD;  Location: [facility];  Service: Gynecology;  Laterality: N/A;    Family History  Problem Relation Age of Onset  . Hypertension Mother   . Stroke Mother   . Lung cancer Mother 65  . Anxiety disorder Mother   . Depression Mother   . Alcohol abuse Mother   . Skin cancer Father   . Prostate cancer Father 28  . Hypertension Father   . Diabetes Father   . Thyroid disease Maternal Aunt   . Thyroid disease Maternal Uncle   . Diabetes Brother   .  Hypertension Brother   . Colon cancer Maternal Grandmother        dx 23s, d. 40s  . Prostate cancer Paternal Grandfather 46  . Breast cancer Cousin        dx 20s-30s    Social History   Socioeconomic History  . Marital status: Single    Spouse name: Not on file  . Number of children: Not on file  . Years of education: Not on file  . Highest education level: Not on file  Occupational History  . Not on file  Tobacco Use  . Smoking status: Never Smoker  . Smokeless tobacco: Never Used  Vaping Use  . Vaping Use: Never used  Substance and Sexual Activity  . Alcohol use: Yes    Comment: occ  . Drug use: Never  . Sexual activity: Not Currently    Birth control/protection: Abstinence  Other Topics Concern  . Not on file  Social History Narrative  . Not on file   Social Determinants of Health   Financial Resource Strain: Not on file  Food Insecurity: Not on file  Transportation Needs: Not on file  Physical Activity: Not on file  Stress: Not on file  Social Connections: Not on file  Intimate Partner Violence: Not on file    Outpatient Medications Prior to Visit  Medication Sig Dispense Refill  . Albuterol Sulfate (PROAIR RESPICLICK) 440 (90 Base) MCG/ACT AEPB Inhale 1-2 puffs into the lungs every 4 (four) hours as needed. 1 each 4  . ALPRAZolam (XANAX) 0.5 MG tablet Take 0.5-1 tablets (0.25-0.5 mg total) by mouth 2 (two) times daily as needed for anxiety. 15 tablet 0  . amLODipine (NORVASC) 5 MG tablet TAKE 1 TABLET BY MOUTH ONCE DAILY 90 tablet 1  . Ascorbic Acid (VITAMIN C) 1000 MG tablet Take 1,000 mg by mouth daily.    Marland Kitchen atorvastatin (LIPITOR) 20 MG tablet TAKE 1 TABLET (20 MG TOTAL) BY MOUTH DAILY. 90 tablet 3  . b complex vitamins tablet Take 1 tablet by mouth daily.    . busPIRone (BUSPAR) 15 MG tablet TAKE 1 TABLET BY MOUTH TWICE DAILY 180 tablet 3  . CALCIUM PO Take 500 mg by mouth daily.    . cetirizine (ZYRTEC) 10 MG tablet Take 10 mg by mouth daily.    .  Cholecalciferol (VITAMIN D3) 10 MCG (400 UNIT) tablet Take 400 Units by mouth daily.    . DULoxetine (CYMBALTA) 30 MG capsule TAKE 1 CAPSULE BY MOUTH DAILY AT 6PM 30 capsule 2  . DULoxetine (CYMBALTA) 60 MG capsule TAKE 1 CAPSULE BY MOUTH ONCE DAILY 30 capsule 0  . EPINEPHrine 0.3 mg/0.3 mL IJ SOAJ injection Inject 0.3 mLs (0.3 mg total) into the muscle once as needed (anaphylaxis/allergic reaction). 1 each 3  . Ferrous Sulfate (IRON PO) Take 325 mg by mouth daily.    . fluticasone (FLONASE) 50 MCG/ACT nasal spray Place 1 spray into both nostrils daily. 16 g 5  . Fluticasone-Salmeterol (ADVAIR) 250-50 MCG/DOSE AEPB INHALE 1 PUFF INTO THE LUNGS 2 (TWO) TIMES DAILY. 60 each 3  . Ginger, Zingiber officinalis, (GINGER ROOT PO) Take by mouth.    . levothyroxine (SYNTHROID) 75 MCG tablet TAKE 1 TABLET BY MOUTH DAILY BEFORE BREAKFAST 90 tablet 0  . lisinopril (ZESTRIL) 20 MG tablet TAKE 1 TABLET BY MOUTH ONCE DAILY 90 tablet 1  . lurasidone (LATUDA) 40 MG TABS tablet TAKE 1 TABLET BY MOUTH ONCE DAILY AT 6PM 30 tablet 2  . Magnesium 500 MG CAPS Take 500 mg by mouth daily.    . Multiple Vitamin (MULTIVITAMIN) capsule Take 1 capsule by mouth daily.    . Omega-3 Fatty Acids (FISH OIL) 1000 MG CAPS Take 1 capsule by mouth 2 (two) times daily.    . Probiotic Product (PROBIOTIC ADVANCED PO) Take 1 capsule by mouth daily.    . RESTASIS 0.05 % ophthalmic emulsion 1 drop 2 (two) times daily.    . traZODone (DESYREL) 50 MG tablet TAKE 1 TABLET BY MOUTH EVERY NIGHT AT BEDTIME 30 tablet 0  . metoprolol tartrate (LOPRESSOR) 100 MG tablet TAKE METOPROLOL 100 MG 2 HOURS PRIOR TO CT (Patient not taking: Reported on 02/16/2021) 1 tablet 0  . omeprazole (PRILOSEC) 40 MG capsule TAKE 1 CAPSULE (40 MG TOTAL) BY MOUTH DAILY. (Patient not taking: Reported on 02/16/2021) 30 capsule 2   No facility-administered medications prior to visit.    Allergies  Allergen Reactions  . Morphine And Related Other (See Comments)    Numb  hands and feet  . Peanut Oil Other (See Comments)    Stomach cramping, hand/foot swelling  . Penicillins Hives  . Polymyxin B-Trimethoprim Other (See Comments), Photosensitivity and Swelling  . Abilify [Aripiprazole] Other (See Comments)  Suicidal thoughts and actions  . Citalopram Other (See Comments)    SSRI's ineffective - Citalopram, Fluoxetine, Sertraline, Excitalopram   . Food Nausea Only, Swelling, Palpitations, Other (See Comments) and Cough    WHEAT  . Prednisone Anxiety, Nausea Only, Other (See Comments) and Palpitations  . Singulair [Montelukast Sodium] Other (See Comments)    Mood changes, worsening depression    Review of Systems All review of systems negative except what is listed in the HPI     Objective:    Physical Exam Constitutional:      Appearance: Normal appearance.  HENT:     Head: Normocephalic and atraumatic.     Right Ear: External ear normal.     Left Ear: Tympanic membrane, ear canal and external ear normal. There is impacted cerumen.     Ears:      Comments: Left ear initially 80% impacted, but irrigated in clinic and TM appears normal. Musculoskeletal:        General: Normal range of motion.     Cervical back: Normal range of motion.  Neurological:     General: No focal deficit present.     Mental Status: She is alert and oriented to person, place, and time. Mental status is at baseline.  Psychiatric:        Mood and Affect: Mood normal.        Behavior: Behavior normal.        Thought Content: Thought content normal.        Judgment: Judgment normal.     BP 118/75 (BP Location: Left Arm, Patient Position: Sitting, Cuff Size: Large)   Pulse 79   Temp 98.2 F (36.8 C) (Oral)   Wt 177 lb 0.6 oz (80.3 kg)   LMP 06/18/2017   BMI 33.45 kg/m  Wt Readings from Last 3 Encounters:  02/16/21 177 lb 0.6 oz (80.3 kg)  12/06/20 188 lb (85.3 kg)  11/02/20 188 lb (85.3 kg)    Health Maintenance Due  Topic Date Due  . Hepatitis C Screening   Never done  . COVID-19 Vaccine (3 - Booster for Pfizer series) 05/30/2020    There are no preventive care reminders to display for this patient.   Lab Results  Component Value Date   TSH 3.634 11/08/2020   Lab Results  Component Value Date   WBC 9.2 11/09/2020   HGB 15.1 (H) 11/09/2020   HCT 45.8 11/09/2020   MCV 98.7 11/09/2020   PLT 434 (H) 11/09/2020   Lab Results  Component Value Date   NA 138 11/07/2020   K 4.0 11/07/2020   CO2 25 11/07/2020   GLUCOSE 130 (H) 11/07/2020   BUN 23 (H) 11/07/2020   CREATININE 0.93 11/07/2020   BILITOT 0.3 11/07/2020   ALKPHOS 78 11/07/2020   AST 22 11/07/2020   ALT 35 11/07/2020   PROT 7.3 11/07/2020   ALBUMIN 4.2 11/07/2020   CALCIUM 9.0 11/07/2020   ANIONGAP 11 11/07/2020   Lab Results  Component Value Date   CHOL 205 (H) 11/08/2020   Lab Results  Component Value Date   HDL 50 11/08/2020   Lab Results  Component Value Date   LDLCALC 137 (H) 11/08/2020   Lab Results  Component Value Date   TRIG 88 11/08/2020   Lab Results  Component Value Date   CHOLHDL 4.1 11/08/2020   Lab Results  Component Value Date   HGBA1C 5.8 (H) 11/08/2020       Assessment & Plan:   1.  Injury of tympanic membrane of right ear, initial encounter Left ear irrigated by CMA today with no difficulty. Given the findings described above and unable to clearly see if there is a perforation, I did not feel comfortable having the right ear irrigated today. Since she has had some hearing loss and bleeding from the right ear, I want her to be evaluated by ENT. Educated her on signs and symptoms that would require urgent evaluation at the ED such as severe ear pain, drainage from the ear, dizziness, nausea/vomiting, hearing loss, etc. - Ambulatory referral to ENT  Follow-up after ENT visit, or sooner if needed.  Terrilyn Saver, NP

## 2021-03-01 ENCOUNTER — Telehealth (INDEPENDENT_AMBULATORY_CARE_PROVIDER_SITE_OTHER): Payer: No Typology Code available for payment source | Admitting: Psychiatry

## 2021-03-01 ENCOUNTER — Other Ambulatory Visit (HOSPITAL_BASED_OUTPATIENT_CLINIC_OR_DEPARTMENT_OTHER): Payer: Self-pay

## 2021-03-01 ENCOUNTER — Encounter (HOSPITAL_COMMUNITY): Payer: Self-pay | Admitting: Psychiatry

## 2021-03-01 DIAGNOSIS — F333 Major depressive disorder, recurrent, severe with psychotic symptoms: Secondary | ICD-10-CM

## 2021-03-01 DIAGNOSIS — F411 Generalized anxiety disorder: Secondary | ICD-10-CM | POA: Diagnosis not present

## 2021-03-01 MED ORDER — DULOXETINE HCL 60 MG PO CPEP
ORAL_CAPSULE | Freq: Every day | ORAL | 0 refills | Status: DC
  Filled 2021-03-01: qty 90, 90d supply, fill #0

## 2021-03-01 MED ORDER — DULOXETINE HCL 30 MG PO CPEP
ORAL_CAPSULE | ORAL | 2 refills | Status: DC
  Filled 2021-03-01: qty 30, fill #0

## 2021-03-01 MED ORDER — LURASIDONE HCL 20 MG PO TABS
20.0000 mg | ORAL_TABLET | Freq: Every day | ORAL | 0 refills | Status: DC
  Filled 2021-03-01: qty 60, 60d supply, fill #0

## 2021-03-01 MED ORDER — DULOXETINE HCL 30 MG PO CPEP
ORAL_CAPSULE | ORAL | 0 refills | Status: DC
  Filled 2021-03-01 – 2021-03-02 (×2): qty 90, 90d supply, fill #0

## 2021-03-01 NOTE — Progress Notes (Signed)
[place] Follow up visit   Patient Identification: Shannon Santiago MRN:  [id number] Date of Evaluation:  03/01/2021 Referral Source: [facility] discharge Chief Complaint:  establish care, depression Visit Diagnosis:    ICD-10-CM   1. MDD (major depressive disorder), recurrent, severe, with psychosis ([place])  F33.3   2. GAD (generalized anxiety disorder)  F41.1    Virtual Visit via Video Note  I connected with Shannon Santiago on 03/01/21 at  1:00 PM EDT by a video enabled telemedicine application and verified that I am speaking with the correct person using two identifiers.  Location: Patient: home work Provider: office   I discussed the limitations of evaluation and management by telemedicine and the availability of in person appointments. The patient expressed understanding and agreed to proceed.      I discussed the assessment and treatment plan with the patient. The patient was provided an opportunity to ask questions and all were answered. The patient agreed with the plan and demonstrated an understanding of the instructions.   The patient was advised to call back or seek an in-person evaluation if the symptoms worsen or if the condition fails to improve as anticipated.  I provided 15  minutes of non-face-to-face time during this encounter.       History of Present Illness: Patient is a 55 years old currently single Caucasian female initially referred after hospital discharge January 21 she was admitted for 4 days. 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  Consequences of Substance Abuse: NA  Past Medical History:  Past Medical History:  Diagnosis Date  . Allergy   . Anxiety   . Asthma   . Depression   . Family history of breast cancer   . Family history of colon cancer   . Family history of lung cancer   . Family history of prostate cancer   . Hyperlipidemia   . Hypertension   . Hypothyroidism   . Prolonged PTT (partial thromboplastin time) 06/23/2019   Normal PT  . Thyroid disease     Past Surgical History:  Procedure Laterality Date  . ENDOMETRIAL BIOPSY  06/21/2019  . EYE SURGERY     x 5  . HYSTEROSCOPY WITH D & C N/A 10/27/2019   Procedure: DILATATION AND CURETTAGE /HYSTEROSCOPY;  Surgeon: Emily Filbert, MD;  Location: [place];  Service: Gynecology;  Laterality: N/A;    Family Psychiatric History: mom depression, anxiety, alcohol use  Family History:  Family History  Problem Relation Age of Onset  . Hypertension Mother   . Stroke Mother   . Lung cancer Mother 72  . Anxiety disorder Mother   . Depression Mother   . Alcohol abuse Mother   . Skin cancer Father   . Prostate cancer Father 34  . Hypertension Father   . Diabetes Father   . Thyroid disease Maternal Aunt   . Thyroid disease Maternal Uncle   .  Diabetes Brother   . Hypertension Brother   . Colon cancer Maternal Grandmother        dx 31s, d. 17s  . Prostate cancer Paternal Grandfather 55  . Breast cancer Cousin        dx 20s-30s    Social History:   Social History   Socioeconomic History  . Marital status: Single    Spouse name: Not on file  . Number of children: Not on file  . Years of education: Not on file  . Highest education level: Not on file  Occupational History  . Not on file  Tobacco Use  . Smoking status: Never Smoker  . Smokeless  tobacco: Never Used  Vaping Use  . Vaping Use: Never used  Substance and Sexual Activity  . Alcohol use: Yes    Comment: occ  . Drug use: Never  . Sexual activity: Not Currently    Birth control/protection: Abstinence  Other Topics Concern  . Not on file  Social History Narrative  . Not on file   Social Determinants of Health   Financial Resource Strain: Not on file  Food Insecurity: Not on file  Transportation Needs: Not on file  Physical Activity: Not on file  Stress: Not on file  Social Connections: Not on file     Allergies:   Allergies  Allergen Reactions  . Morphine And Related Other (See Comments)    Numb hands and feet  . Peanut Oil Other (See Comments)    Stomach cramping, hand/foot swelling  . Penicillins Hives  . Polymyxin B-Trimethoprim Other (See Comments), Photosensitivity and Swelling  . Abilify [Aripiprazole] Other (See Comments)    Suicidal thoughts and actions  . Citalopram Other (See Comments)    SSRI's ineffective - Citalopram, Fluoxetine, Sertraline, Excitalopram   . Food Nausea Only, Swelling, Palpitations, Other (See Comments) and Cough    WHEAT  . Prednisone Anxiety, Nausea Only, Other (See Comments) and Palpitations  . Singulair [Montelukast Sodium] Other (See Comments)    Mood changes, worsening depression    Metabolic Disorder Labs: Lab Results  Component Value Date   HGBA1C 5.8 (H) 11/08/2020   MPG 119.76 11/08/2020   MPG 126 08/11/2020   Lab Results  Component Value Date   PROLACTIN 9.2 11/09/2020   PROLACTIN 10.6 06/18/2019   Lab Results  Component Value Date   CHOL 205 (H) 11/08/2020   TRIG 88 11/08/2020   HDL 50 11/08/2020   CHOLHDL 4.1 11/08/2020   VLDL 18 11/08/2020   LDLCALC 137 (H) 11/08/2020   LDLCALC 196 (H) 08/11/2020   Lab Results  Component Value Date   TSH 3.634 11/08/2020    Therapeutic Level Labs: No results found for: LITHIUM No results found for: CBMZ No results found for: VALPROATE  Current  Medications: Current Outpatient Medications  Medication Sig Dispense Refill  . lurasidone (LATUDA) 20 MG TABS tablet Take 1 tablet (20 mg total) by mouth daily. 60 tablet 0  . Albuterol Sulfate (PROAIR RESPICLICK) [id number] (90 Base) MCG/ACT AEPB Inhale 1-2 puffs into the lungs every 4 (four) hours as needed. 1 each 4  . ALPRAZolam (XANAX) 0.5 MG tablet Take 0.5-1 tablets (0.25-0.5 mg total) by mouth 2 (two) times daily as needed for anxiety. 15 tablet 0  . amLODipine (NORVASC) 5 MG tablet TAKE 1 TABLET BY MOUTH ONCE DAILY 90 tablet 1  . Ascorbic Acid (VITAMIN C) 1000 MG tablet Take 1,000 mg by mouth daily.    Marland Kitchen atorvastatin (LIPITOR) 20 MG tablet  TAKE 1 TABLET (20 MG TOTAL) BY MOUTH DAILY. 90 tablet 3  . b complex vitamins tablet Take 1 tablet by mouth daily.    . busPIRone (BUSPAR) 15 MG tablet TAKE 1 TABLET BY MOUTH TWICE DAILY 180 tablet 3  . CALCIUM PO Take 500 mg by mouth daily.    . cetirizine (ZYRTEC) 10 MG tablet Take 10 mg by mouth daily.    . Cholecalciferol (VITAMIN D3) 10 MCG (400 UNIT) tablet Take 400 Units by mouth daily.    . DULoxetine (CYMBALTA) 30 MG capsule TAKE 1 CAPSULE BY MOUTH DAILY AT 6PM 90 capsule 0  . DULoxetine (CYMBALTA) 60 MG capsule TAKE 1 CAPSULE BY MOUTH ONCE DAILY 90 capsule 0  . EPINEPHrine 0.3 mg/0.3 mL IJ SOAJ injection Inject 0.3 mLs (0.3 mg total) into the muscle once as needed (anaphylaxis/allergic reaction). 1 each 3  . Ferrous Sulfate (IRON PO) Take 325 mg by mouth daily.    . fluticasone (FLONASE) 50 MCG/ACT nasal spray Place 1 spray into both nostrils daily. 16 g 5  . Fluticasone-Salmeterol (ADVAIR) 250-50 MCG/DOSE AEPB INHALE 1 PUFF INTO THE LUNGS 2 (TWO) TIMES DAILY. 60 each 3  . Ginger, Zingiber officinalis, (GINGER ROOT PO) Take by mouth.    . levothyroxine (SYNTHROID) 75 MCG tablet TAKE 1 TABLET BY MOUTH DAILY BEFORE BREAKFAST 90 tablet 0  . lisinopril (ZESTRIL) 20 MG tablet TAKE 1 TABLET BY MOUTH ONCE DAILY 90 tablet 1  . lurasidone (LATUDA) 40 MG  TABS tablet TAKE 1 TABLET BY MOUTH ONCE DAILY AT 6PM 30 tablet 2  . Magnesium 500 MG CAPS Take 500 mg by mouth daily.    . metoprolol tartrate (LOPRESSOR) 100 MG tablet TAKE METOPROLOL 100 MG 2 HOURS PRIOR TO CT (Patient not taking: Reported on 02/16/2021) 1 tablet 0  . Multiple Vitamin (MULTIVITAMIN) capsule Take 1 capsule by mouth daily.    . Omega-3 Fatty Acids (FISH OIL) 1000 MG CAPS Take 1 capsule by mouth 2 (two) times daily.    Marland Kitchen omeprazole (PRILOSEC) 40 MG capsule TAKE 1 CAPSULE (40 MG TOTAL) BY MOUTH DAILY. (Patient not taking: Reported on 02/16/2021) 30 capsule 2  . Probiotic Product (PROBIOTIC ADVANCED PO) Take 1 capsule by mouth daily.    . RESTASIS 0.05 % ophthalmic emulsion 1 drop 2 (two) times daily.     No current facility-administered medications for this visit.     Psychiatric Specialty Exam: Review of Systems  Cardiovascular: Negative for chest pain.  Psychiatric/Behavioral: Negative for agitation and self-injury.    Last menstrual period 06/18/2017.There is no height or weight on file to calculate BMI.  General Appearance: Casual  Eye Contact:  Fair  Speech:  Clear and Coherent  Volume:  Normal  Mood:  fair  Affect:  Congruent  Thought Process:  Goal Directed  Orientation:  Full (Time, Place, and Person)  Thought Content:  Rumination  Suicidal Thoughts:  No  Homicidal Thoughts:  No  Memory:  Immediate;   Fair Recent;   Fair  Judgement:  Fair  Insight:  Fair  Psychomotor Activity:  Normal  Concentration:  Concentration: Fair and Attention Span: Fair  Recall:  [employer] of Knowledge:Good  Language: Good  Akathisia:  No  Handed:    AIMS (if indicated):  No involuntary movements  Assets:  Desire for Improvement  ADL's:  Intact  Cognition: WNL  Sleep:  variable to fair   Screenings: AUDIT   Flowsheet Row Admission (Discharged) from 11/08/2020 in [place]  INPATIENT ADULT [facility]  Alcohol Use Disorder Identification Test Final Score (AUDIT) 3     GAD-7   Flowsheet Row Video Visit from 09/11/2020 in [place] Video Visit from 08/25/2020 in [place] Office Visit from 08/11/2020 in [place] Visit from 11/24/2019 in [place] Video Visit from 09/10/2019 in [place]  Total GAD-7 Score 16 16 16 15 16     PHQ2-9   Flowsheet Row Video Visit from 11/30/2020 in [place] Video Visit from 09/11/2020 in [place] Video Visit from 08/25/2020 in [place] Office Visit from 08/11/2020 in [place] Office Visit from 11/24/2019 in [place]  PHQ-2 Total Score 3 4 6 6 4   PHQ-9 Total Score 9 20 23 25 13     Flowsheet Row Video Visit from 03/01/2021 in [place] Video Visit from 12/28/2020 in [place] Video Visit from 11/30/2020 in [place] No Risk Error: Q3, 4, or 5 should not be populated when Q2 is No Error: Q3, 4, or 5 should not be populated when Q2 is No      Assessment and Plan: as follows  Major depressive disorder recurrent severe with psychotic featuresimproved but has oro buccal movements Will lower latuda to 20mg , next step would be to stop or conisder lamictal, other option just to continue cymbalta  Generalized anxiety disorder;  Manageable continue cymbalta   Fu 61m.    [name], MD 5/12/20221:16 PM

## 2021-03-02 ENCOUNTER — Other Ambulatory Visit: Payer: Self-pay

## 2021-03-02 ENCOUNTER — Other Ambulatory Visit (HOSPITAL_BASED_OUTPATIENT_CLINIC_OR_DEPARTMENT_OTHER): Payer: Self-pay

## 2021-03-05 ENCOUNTER — Ambulatory Visit (INDEPENDENT_AMBULATORY_CARE_PROVIDER_SITE_OTHER): Payer: No Typology Code available for payment source | Admitting: Cardiology

## 2021-03-05 ENCOUNTER — Other Ambulatory Visit: Payer: Self-pay

## 2021-03-05 ENCOUNTER — Encounter: Payer: Self-pay | Admitting: Cardiology

## 2021-03-05 ENCOUNTER — Other Ambulatory Visit (HOSPITAL_BASED_OUTPATIENT_CLINIC_OR_DEPARTMENT_OTHER): Payer: Self-pay

## 2021-03-05 VITALS — BP 112/78 | HR 88 | Ht 61.0 in | Wt 173.0 lb

## 2021-03-05 DIAGNOSIS — E669 Obesity, unspecified: Secondary | ICD-10-CM | POA: Insufficient documentation

## 2021-03-05 DIAGNOSIS — E782 Mixed hyperlipidemia: Secondary | ICD-10-CM

## 2021-03-05 DIAGNOSIS — R7303 Prediabetes: Secondary | ICD-10-CM

## 2021-03-05 DIAGNOSIS — I2510 Atherosclerotic heart disease of native coronary artery without angina pectoris: Secondary | ICD-10-CM | POA: Insufficient documentation

## 2021-03-05 DIAGNOSIS — I491 Atrial premature depolarization: Secondary | ICD-10-CM

## 2021-03-05 DIAGNOSIS — I10 Essential (primary) hypertension: Secondary | ICD-10-CM | POA: Diagnosis not present

## 2021-03-05 MED ORDER — ATORVASTATIN CALCIUM 40 MG PO TABS
40.0000 mg | ORAL_TABLET | Freq: Every day | ORAL | 3 refills | Status: DC
  Filled 2021-03-05: qty 90, 90d supply, fill #0
  Filled 2021-05-23: qty 90, 90d supply, fill #1
  Filled 2021-09-19 – 2021-10-01 (×2): qty 90, 90d supply, fill #2
  Filled 2021-12-26: qty 90, 90d supply, fill #3

## 2021-03-05 NOTE — Progress Notes (Signed)
Cardiology Office Note:    Date:  03/05/2021   ID:  Shannon Santiago, DOB 11/04/1965, MRN [id number]  PCP:  Sunnie Nielsen, DO  Cardiologist:  Thomasene Ripple, DO  Electrophysiologist:  None   Referring MD: Sunnie Nielsen, DO    History of Present Illness:    Shannon Santiago is a 55 y.o. female with a hx of mild coronary artery disease, hypertension, hyperlipidemia, depression, anxiety is here today for follow-up visit.  Did see the patient virtually for telehealth video visit on December 06, 2020 at that time it she was experiencing intermittent chest pain and had an abnormal EKG therefore I recommended the patient undergo a coronary CTA.  In the interim she did get her coronary CTA which showed mild coronary artery disease.  She is here today for follow-up.  Past Medical History:  Diagnosis Date  . Allergy   . Anxiety   . Asthma   . Depression   . Family history of breast cancer   . Family history of colon cancer   . Family history of lung cancer   . Family history of prostate cancer   . Hyperlipidemia   . Hypertension   . Hypothyroidism   . Prolonged PTT (partial thromboplastin time) 06/23/2019   Normal PT  . Thyroid disease     Past Surgical History:  Procedure Laterality Date  . ENDOMETRIAL BIOPSY  06/21/2019  . EYE SURGERY     x 5  . HYSTEROSCOPY WITH D & C N/A 10/27/2019   Procedure: DILATATION AND CURETTAGE /HYSTEROSCOPY;  Surgeon: Allie Bossier, MD;  Location: [facility];  Service: Gynecology;  Laterality: N/A;    Current Medications: Current Meds  Medication Sig  . Albuterol Sulfate (PROAIR RESPICLICK) 108 (90 Base) MCG/ACT AEPB Inhale 1-2 puffs into the lungs every 4 (four) hours as needed.  . ALPRAZolam (XANAX) 0.5 MG tablet Take 0.5-1 tablets (0.25-0.5 mg total) by mouth 2 (two) times daily as needed for anxiety.  Marland Kitchen amLODipine (NORVASC) 5 MG tablet TAKE 1 TABLET BY MOUTH ONCE DAILY  . Ascorbic Acid (VITAMIN C) 1000 MG tablet Take 1,000 mg  by mouth daily.  Marland Kitchen atorvastatin (LIPITOR) 40 MG tablet Take 1 tablet (40 mg total) by mouth daily.  Marland Kitchen b complex vitamins tablet Take 1 tablet by mouth daily.  . busPIRone (BUSPAR) 15 MG tablet TAKE 1 TABLET BY MOUTH TWICE DAILY  . CALCIUM PO Take 500 mg by mouth daily.  . cetirizine (ZYRTEC) 10 MG tablet Take 10 mg by mouth daily.  . Cholecalciferol (VITAMIN D3) 10 MCG (400 UNIT) tablet Take 400 Units by mouth daily.  . DULoxetine (CYMBALTA) 30 MG capsule TAKE 1 CAPSULE BY MOUTH DAILY AT 6PM  . DULoxetine (CYMBALTA) 60 MG capsule TAKE 1 CAPSULE BY MOUTH ONCE DAILY  . EPINEPHrine 0.3 mg/0.3 mL IJ SOAJ injection Inject 0.3 mLs (0.3 mg total) into the muscle once as needed (anaphylaxis/allergic reaction).  . Ferrous Sulfate (IRON PO) Take 325 mg by mouth daily.  . fluticasone (FLONASE) 50 MCG/ACT nasal spray Place 1 spray into both nostrils daily.  . Fluticasone-Salmeterol (ADVAIR) 250-50 MCG/DOSE AEPB INHALE 1 PUFF INTO THE LUNGS 2 (TWO) TIMES DAILY.  . Ginger, Zingiber officinalis, (GINGER ROOT PO) Take by mouth.  . levothyroxine (SYNTHROID) 75 MCG tablet TAKE 1 TABLET BY MOUTH DAILY BEFORE BREAKFAST  . lisinopril (ZESTRIL) 20 MG tablet TAKE 1 TABLET BY MOUTH ONCE DAILY  . lurasidone (LATUDA) 20 MG TABS tablet Take 1 tablet (20 mg  total) by mouth daily.  . Magnesium 500 MG CAPS Take 500 mg by mouth daily.  . Multiple Vitamin (MULTIVITAMIN) capsule Take 1 capsule by mouth daily.  . Omega-3 Fatty Acids (FISH OIL) 1000 MG CAPS Take 1 capsule by mouth 2 (two) times daily.  . Probiotic Product (PROBIOTIC ADVANCED PO) Take 1 capsule by mouth daily.  . RESTASIS 0.05 % ophthalmic emulsion 1 drop 2 (two) times daily.  . [DISCONTINUED] atorvastatin (LIPITOR) 20 MG tablet TAKE 1 TABLET (20 MG TOTAL) BY MOUTH DAILY.     Allergies:   Morphine and related, Peanut oil, Penicillins, Polymyxin b-trimethoprim, Abilify [aripiprazole], Citalopram, Food, Prednisone, and Singulair [montelukast sodium]   Social  History   Socioeconomic History  . Marital status: Single    Spouse name: Not on file  . Number of children: Not on file  . Years of education: Not on file  . Highest education level: Not on file  Occupational History  . Not on file  Tobacco Use  . Smoking status: Never Smoker  . Smokeless tobacco: Never Used  Vaping Use  . Vaping Use: Never used  Substance and Sexual Activity  . Alcohol use: Yes    Comment: occ  . Drug use: Never  . Sexual activity: Not Currently    Birth control/protection: Abstinence  Other Topics Concern  . Not on file  Social History Narrative  . Not on file   Social Determinants of Health   Financial Resource Strain: Not on file  Food Insecurity: Not on file  Transportation Needs: Not on file  Physical Activity: Not on file  Stress: Not on file  Social Connections: Not on file     Family History: The patient's family history includes Alcohol abuse in her mother; Anxiety disorder in her mother; Breast cancer in her cousin; Colon cancer in her maternal grandmother; Depression in her mother; Diabetes in her brother and father; Hypertension in her brother, father, and mother; Lung cancer (age of onset: 10274) in her mother; Prostate cancer (age of onset: 5568) in her father; Prostate cancer (age of onset: 6770) in her paternal grandfather; Skin cancer in her father; Stroke in her mother; Thyroid disease in her maternal aunt and maternal uncle.  ROS:   Review of Systems  Constitution: Negative for decreased appetite, fever and weight gain.  HENT: Negative for congestion, ear discharge, hoarse voice and sore throat.   Eyes: Negative for discharge, redness, vision loss in right eye and visual halos.  Cardiovascular: Negative for chest pain, dyspnea on exertion, leg swelling, orthopnea and palpitations.  Respiratory: Negative for cough, hemoptysis, shortness of breath and snoring.   Endocrine: Negative for heat intolerance and polyphagia.  Hematologic/Lymphatic:  Negative for bleeding problem. Does not bruise/bleed easily.  Skin: Negative for flushing, nail changes, rash and suspicious lesions.  Musculoskeletal: Negative for arthritis, joint pain, muscle cramps, myalgias, neck pain and stiffness.  Gastrointestinal: Negative for abdominal pain, bowel incontinence, diarrhea and excessive appetite.  Genitourinary: Negative for decreased libido, genital sores and incomplete emptying.  Neurological: Negative for brief paralysis, focal weakness, headaches and loss of balance.  Psychiatric/Behavioral: Negative for altered mental status, depression and suicidal ideas.  Allergic/Immunologic: Negative for HIV exposure and persistent infections.    EKGs/Labs/Other Studies Reviewed:    The following studies were reviewed today:   EKG: None today  Coronary CTA March 2022 Aorta:  Normal size.  No calcifications.  No dissection.  Aortic Valve:  Tri-leaflet.  No calcifications.  Coronary Arteries:  Normal coronary origin.  Co-dominance.  Coronary calcium score of 34. This was 90th percentile for age, sex, and race matched control.  RCA is a large co-dominant artery that gives rise to PDA and PLA. There is a mild non-obstructive (25-49%) calcified plaque in the proximal vessel. There is a mild non-obstructive (25-49%) calcified plaque in the mid vessel.  Left main is a large artery that gives rise to LAD and LCX arteries. There is no plaque.  LAD is a large vessel that gives rise to one large D1 Branch that bifurcates. There is a minimal non-obstructive (1-24%) calcified plaque in the proximal vessel. There is a minimal non-obstructive (1-24%) calcified plaque in the mid vessel. There is a mild non-obstructive (25-49%) mixed plaque in the ostium of the D1 vessel- the Glagov phenomenon is noted. There is a mild non-obstructive plaque (25-49%) calcified plaque in the ostium of the D1 distal bifurcation.  LCX is a co-dominant artery that gives  rise to one large OM1 branch. There is no plaque.  Other findings:  Normal pulmonary vein drainage into the left atrium.  Normal left atrial appendage without a thrombus.  Normal size of the pulmonary artery.  Extra-cardiac findings: See attached radiology report for non-cardiac structures.  IMPRESSION: 1. Coronary calcium score of 34. This was 90th percentile for age, sex, and race matched control.  2. Normal coronary origin.  Co-dominance.  3. CAD-RADS 2. Mild non-obstructive CAD (25-49%). Consider non-atherosclerotic causes of chest pain. Consider preventive therapy and risk factor modification. CT-FFR sent based off of Ostial D1 remodeling.  September 2020 Transthoracic echocardiogram IMPRESSIONS  1. The left ventricle has normal systolic function with an ejection  fraction of 60-65%. The cavity size was normal. Left ventricular diastolic  Doppler parameters are consistent with impaired relaxation.  2. The right ventricle has normal systolic function. The cavity was  normal. There is no increase in right ventricular wall thickness.  3. The aorta is normal unless otherwise noted.   FINDINGS  Left Ventricle: The left ventricle has normal systolic function, with an  ejection fraction of 60-65%. The cavity size was normal. There is no  increase in left ventricular wall thickness. Left ventricular diastolic  Doppler parameters are consistent with  impaired relaxation.   Right Ventricle: The right ventricle has normal systolic function. The  cavity was normal. There is no increase in right ventricular wall  thickness.   Left Atrium: Left atrial size was normal in size.   Right Atrium: Right atrial size was normal in size. Right atrial pressure  is estimated at 3 mmHg.   Interatrial Septum: No atrial level shunt detected by color flow Doppler.   Pericardium: There is no evidence of pericardial effusion.   Mitral Valve: The mitral valve is normal in  structure. Mitral valve  regurgitation is trivial by color flow Doppler.   Tricuspid Valve: The tricuspid valve is normal in structure. Tricuspid  valve regurgitation is trivial by color flow Doppler.   Aortic Valve: The aortic valve is normal in structure. Aortic valve  regurgitation was not assessed by color flow Doppler.   Pulmonic Valve: The pulmonic valve was normal in structure. Pulmonic valve  regurgitation was not assessed by color flow Doppler.   Aorta: The aorta is normal unless otherwise noted.   Venous: The inferior vena cava measures 2.18 cm, is normal in size with  greater than 50% respiratory variability.    Recent Labs: 11/07/2020: ALT 35; BUN 23; Creatinine, Ser 0.93; Potassium 4.0; Sodium 138 11/08/2020: TSH 3.634 11/09/2020: Hemoglobin  15.1; Platelets 434  Recent Lipid Panel    Component Value Date/Time   CHOL 205 (H) 11/08/2020 1753   CHOL 265 (H) 09/09/2019 1141   TRIG 88 11/08/2020 1753   HDL 50 11/08/2020 1753   HDL 43 09/09/2019 1141   CHOLHDL 4.1 11/08/2020 1753   VLDL 18 11/08/2020 1753   LDLCALC 137 (H) 11/08/2020 1753   LDLCALC 196 (H) 08/11/2020 0909    Physical Exam:    VS:  BP 112/78   Pulse 88   Ht 5\' 1"  (1.549 m)   Wt 173 lb (78.5 kg)   LMP 06/18/2017   SpO2 97%   BMI 32.69 kg/m     Wt Readings from Last 3 Encounters:  03/05/21 173 lb (78.5 kg)  02/16/21 177 lb 0.6 oz (80.3 kg)  12/06/20 188 lb (85.3 kg)     GEN: Well nourished, well developed in no acute distress HEENT: Normal NECK: No JVD; No carotid bruits LYMPHATICS: No lymphadenopathy CARDIAC: S1S2 noted,RRR, no murmurs, rubs, gallops RESPIRATORY:  Clear to auscultation without rales, wheezing or rhonchi  ABDOMEN: Soft, non-tender, non-distended, +bowel sounds, no guarding. EXTREMITIES: No edema, No cyanosis, no clubbing MUSCULOSKELETAL:  No deformity  SKIN: Warm and dry NEUROLOGIC:  Alert and oriented x 3, non-focal PSYCHIATRIC:  Normal affect, good  insight  ASSESSMENT:    1. Mild CAD   2. Premature atrial complexes   3. Hypertension, unspecified type   4. Mixed hyperlipidemia   5. Obesity (BMI 30-39.9)   6. Prediabetes    PLAN:     We talked about her diagnosis of coronary artery disease.  Explained to the patient what this means.  Unfortunately she is not willing to take aspirin as this upsets her stomach she tells me.  She is on atorvastatin 20 mg daily.  I reviewed her most recent lipid profile which was done in January 2020.  Shows HDL 50, LDL 137, total cholesterol 2 5, triglyceride 88.  At this time I would recommend increasing her Lipitor to a higher intensity 40 mg daily.  Given her CAD and need to have her LDL be less than 77.  Prediabetes-this is being managed with diet and exercise.  The patient understands the need to lose weight with diet and exercise. We have discussed specific strategies for this.  The patient is in agreement with the above plan. The patient left the office in stable condition.  The patient will follow up in   Medication Adjustments/Labs and Tests Ordered: Current medicines are reviewed at length with the patient today.  Concerns regarding medicines are outlined above.  No orders of the defined types were placed in this encounter.  Meds ordered this encounter  Medications  . atorvastatin (LIPITOR) 40 MG tablet    Sig: Take 1 tablet (40 mg total) by mouth daily.    Dispense:  90 tablet    Refill:  3    Patient Instructions   Medication Instructions:  Your physician has recommended you make the following change in your medication:  INCREASE:  Lipitor 40 mg once daily *If you need a refill on your cardiac medications before your next appointment, please call your pharmacy*   Lab Work: None If you have labs (blood work) drawn today and your tests are completely normal, you will receive your results only by: Marland Kitchen MyChart Message (if you have MyChart) OR . A paper copy in the mail If you  have any lab test that is abnormal or we need to change  your treatment, we will call you to review the results.   Testing/Procedures: None   Follow-Up: At [facility], you and your health needs are our priority.  As part of our continuing mission to provide you with exceptional heart care, we have created designated Provider Care Teams.  These Care Teams include your primary Cardiologist (physician) and Advanced Practice Providers (APPs -  Physician Assistants and Nurse Practitioners) who all work together to provide you with the care you need, when you need it.  We recommend signing up for the patient portal called "MyChart".  Sign up information is provided on this After Visit Summary.  MyChart is used to connect with patients for Virtual Visits (Telemedicine).  Patients are able to view lab/test results, encounter notes, upcoming appointments, etc.  Non-urgent messages can be sent to your provider as well.   To learn more about what you can do with MyChart, go to [email or web address].    Your next appointment:   1 year(s)  The format for your next appointment:   In Person  Provider:   Berniece Salines, DO   Other Instructions  [place] refers to food and lifestyle choices that are based on the traditions of countries located on the [place]. This way of eating has been shown to help prevent certain conditions and improve outcomes for people who have chronic diseases, like kidney disease and heart disease. What are tips for following this plan? Lifestyle  Cook and eat meals together with your family, when possible.  Drink enough fluid to keep your urine clear or pale yellow.  Be physically active every day. This includes: ? Aerobic exercise like running or swimming. ? Leisure activities like gardening, walking, or housework.  Get 7-8 hours of sleep each night.  If recommended by your health care provider, drink red wine in moderation.  This means 1 glass a day for nonpregnant women and 2 glasses a day for men. A glass of wine equals 5 oz (150 mL). Reading food labels  Check the serving size of packaged foods. For foods such as rice and pasta, the serving size refers to the amount of cooked product, not dry.  Check the total fat in packaged foods. Avoid foods that have saturated fat or trans fats.  Check the ingredients list for added sugars, such as corn syrup.   Shopping  At the grocery store, buy most of your food from the areas near the walls of the store. This includes: ? Fresh fruits and vegetables (produce). ? Grains, beans, nuts, and seeds. Some of these may be available in unpackaged forms or large amounts (in bulk). ? Fresh seafood. ? Poultry and eggs. ? Low-fat dairy products.  Buy whole ingredients instead of prepackaged foods.  Buy fresh fruits and vegetables in-season from local farmers markets.  Buy frozen fruits and vegetables in resealable bags.  If you do not have access to quality fresh seafood, buy precooked frozen shrimp or canned fish, such as tuna, salmon, or sardines.  Buy small amounts of raw or cooked vegetables, salads, or olives from the deli or salad bar at your store.  Stock your pantry so you always have certain foods on hand, such as olive oil, canned tuna, canned tomatoes, rice, pasta, and beans. Cooking  Cook foods with extra-virgin olive oil instead of using butter or other vegetable oils.  Have meat as a side dish, and have vegetables or grains as your main dish. This means having meat in small portions  or adding small amounts of meat to foods like pasta or stew.  Use beans or vegetables instead of meat in common dishes like chili or lasagna.  Experiment with different cooking methods. Try roasting or broiling vegetables instead of steaming or sauteing them.  Add frozen vegetables to soups, stews, pasta, or rice.  Add nuts or seeds for added healthy fat at each meal. You  can add these to yogurt, salads, or vegetable dishes.  Marinate fish or vegetables using olive oil, lemon juice, garlic, and fresh herbs. Meal planning  Plan to eat 1 vegetarian meal one day each week. Try to work up to 2 vegetarian meals, if possible.  Eat seafood 2 or more times a week.  Have healthy snacks readily available, such as: ? Vegetable sticks with hummus. ? [place] yogurt. ? Fruit and nut trail mix.  Eat balanced meals throughout the week. This includes: ? Fruit: 2-3 servings a day ? Vegetables: 4-5 servings a day ? Low-fat dairy: 2 servings a day ? Fish, poultry, or lean meat: 1 serving a day ? Beans and legumes: 2 or more servings a week ? Nuts and seeds: 1-2 servings a day ? Whole grains: 6-8 servings a day ? Extra-virgin olive oil: 3-4 servings a day  Limit red meat and sweets to only a few servings a month   What are my food choices?  Mediterranean diet ? Recommended  Grains: Whole-grain pasta. Brown rice. Bulgar wheat. Polenta. Couscous. Whole-wheat bread. [place].  Vegetables: Artichokes. Beets. Broccoli. Cabbage. Carrots. Eggplant. Green beans. Chard. Kale. Spinach. Onions. Leeks. Peas. Squash. Tomatoes. Peppers. Radishes.  Fruits: Apples. Apricots. Avocado. Berries. Bananas. Cherries. Dates. Figs. Grapes. Lemons. Melon. Oranges. Peaches. Plums. Pomegranate.  Meats and other protein foods: Beans. Almonds. Sunflower seeds. Pine nuts. Peanuts. [place]. Salmon. Scallops. Shrimp. [place]. Tilapia. Clams. Oysters. Eggs.  Dairy: Low-fat milk. Cheese. Greek yogurt.  Beverages: Water. Red wine. Herbal tea.  Fats and oils: Extra virgin olive oil. Avocado oil. Grape seed oil.  Sweets and desserts: [place] yogurt with honey. Baked apples. Poached pears. Trail mix.  Seasoning and other foods: Basil. Cilantro. Coriander. Cumin. Mint. Parsley. Sage. Rosemary. Tarragon. Garlic. Oregano. Thyme. Pepper. Balsalmic vinegar. Tahini. Hummus. Tomato sauce. Olives.  Mushrooms. ? Limit these  Grains: Prepackaged pasta or rice dishes. Prepackaged cereal with added sugar.  Vegetables: Deep fried potatoes (french fries).  Fruits: Fruit canned in syrup.  Meats and other protein foods: Beef. Pork. Lamb. Poultry with skin. Hot dogs. Berniece Salines.  Dairy: Ice cream. Sour cream. Whole milk.  Beverages: Juice. Sugar-sweetened soft drinks. Beer. Liquor and spirits.  Fats and oils: Butter. Canola oil. Vegetable oil. Beef fat (tallow). Lard.  Sweets and desserts: Cookies. Cakes. Pies. Candy.  Seasoning and other foods: Mayonnaise. Premade sauces and marinades. The items listed may not be a complete list. Talk with your dietitian about what dietary choices are right for you. Summary  The Mediterranean diet includes both food and lifestyle choices.  Eat a variety of fresh fruits and vegetables, beans, nuts, seeds, and whole grains.  Limit the amount of red meat and sweets that you eat.  Talk with your health care provider about whether it is safe for you to drink red wine in moderation. This means 1 glass a day for nonpregnant women and 2 glasses a day for men. A glass of wine equals 5 oz (150 mL). This information is not intended to replace advice given to you by your health care provider. Make sure you discuss any questions  you have with your health care provider. Document Revised: 06/06/2016 Document Reviewed: 05/30/2016 Elsevier Patient Education  [place].      Adopting a Healthy Lifestyle.  Know what a healthy weight is for you (roughly BMI <25) and aim to maintain this   Aim for 7+ servings of fruits and vegetables daily   65-80+ fluid ounces of water or unsweet tea for healthy kidneys   Limit to max 1 drink of alcohol per day; avoid smoking/tobacco   Limit animal fats in diet for cholesterol and heart health - choose grass fed whenever available   Avoid highly processed foods, and foods high in saturated/trans fats   Aim for low  stress - take time to unwind and care for your mental health   Aim for 150 min of moderate intensity exercise weekly for heart health, and weights twice weekly for bone health   Aim for 7-9 hours of sleep daily   When it comes to diets, agreement about the perfect plan isnt easy to find, even among the experts. Experts at the [employer] developed an idea known as the Healthy Eating Plate. Just imagine a plate divided into logical, healthy portions.   The emphasis is on diet quality:   Load up on vegetables and fruits - one-half of your plate: Aim for color and variety, and remember that potatoes dont count.   Go for whole grains - one-quarter of your plate: Whole wheat, barley, wheat berries, quinoa, oats, brown rice, and foods made with them. If you want pasta, go with whole wheat pasta.   Protein power - one-quarter of your plate: Fish, chicken, beans, and nuts are all healthy, versatile protein sources. Limit red meat.   The diet, however, does go beyond the plate, offering a few other suggestions.   Use healthy plant oils, such as olive, canola, soy, corn, sunflower and peanut. Check the labels, and avoid partially hydrogenated oil, which have unhealthy trans fats.   If youre thirsty, drink water. Coffee and tea are good in moderation, but skip sugary drinks and limit milk and dairy products to one or two daily servings.   The type of carbohydrate in the diet is more important than the amount. Some sources of carbohydrates, such as vegetables, fruits, whole grains, and beans-are healthier than others.   Finally, stay active  Signed, Berniece Salines, DO  03/05/2021 9:54 AM    [place]

## 2021-03-05 NOTE — Patient Instructions (Signed)
Medication Instructions:  Your physician has recommended you make the following change in your medication:  INCREASE:  Lipitor 40 mg once daily *If you need a refill on your cardiac medications before your next appointment, please call your pharmacy*   Lab Work: None If you have labs (blood work) drawn today and your tests are completely normal, you will receive your results only by: Marland Kitchen MyChart Message (if you have MyChart) OR . A paper copy in the mail If you have any lab test that is abnormal or we need to change your treatment, we will call you to review the results.   Testing/Procedures: None   Follow-Up: At [facility], you and your health needs are our priority.  As part of our continuing mission to provide you with exceptional heart care, we have created designated Provider Care Teams.  These Care Teams include your primary Cardiologist (physician) and Advanced Practice Providers (APPs -  Physician Assistants and Nurse Practitioners) who all work together to provide you with the care you need, when you need it.  We recommend signing up for the patient portal called "MyChart".  Sign up information is provided on this After Visit Summary.  MyChart is used to connect with patients for Virtual Visits (Telemedicine).  Patients are able to view lab/test results, encounter notes, upcoming appointments, etc.  Non-urgent messages can be sent to your provider as well.   To learn more about what you can do with MyChart, go to [email or web address].    Your next appointment:   1 year(s)  The format for your next appointment:   In Person  Provider:   Berniece Salines, DO   Other Instructions  [place] refers to food and lifestyle choices that are based on the traditions of countries located on the [place]. This way of eating has been shown to help prevent certain conditions and improve outcomes for people who have chronic diseases, like kidney  disease and heart disease. What are tips for following this plan? Lifestyle  Cook and eat meals together with your family, when possible.  Drink enough fluid to keep your urine clear or pale yellow.  Be physically active every day. This includes: ? Aerobic exercise like running or swimming. ? Leisure activities like gardening, walking, or housework.  Get 7-8 hours of sleep each night.  If recommended by your health care provider, drink red wine in moderation. This means 1 glass a day for nonpregnant women and 2 glasses a day for men. A glass of wine equals 5 oz (150 mL). Reading food labels  Check the serving size of packaged foods. For foods such as rice and pasta, the serving size refers to the amount of cooked product, not dry.  Check the total fat in packaged foods. Avoid foods that have saturated fat or trans fats.  Check the ingredients list for added sugars, such as corn syrup.   Shopping  At the grocery store, buy most of your food from the areas near the walls of the store. This includes: ? Fresh fruits and vegetables (produce). ? Grains, beans, nuts, and seeds. Some of these may be available in unpackaged forms or large amounts (in bulk). ? Fresh seafood. ? Poultry and eggs. ? Low-fat dairy products.  Buy whole ingredients instead of prepackaged foods.  Buy fresh fruits and vegetables in-season from local farmers markets.  Buy frozen fruits and vegetables in resealable bags.  If you do not have access to quality fresh seafood, buy precooked  frozen shrimp or canned fish, such as tuna, salmon, or sardines.  Buy small amounts of raw or cooked vegetables, salads, or olives from the deli or salad bar at your store.  Stock your pantry so you always have certain foods on hand, such as olive oil, canned tuna, canned tomatoes, rice, pasta, and beans. Cooking  Cook foods with extra-virgin olive oil instead of using butter or other vegetable oils.  Have meat as a side  dish, and have vegetables or grains as your main dish. This means having meat in small portions or adding small amounts of meat to foods like pasta or stew.  Use beans or vegetables instead of meat in common dishes like chili or lasagna.  Experiment with different cooking methods. Try roasting or broiling vegetables instead of steaming or sauteing them.  Add frozen vegetables to soups, stews, pasta, or rice.  Add nuts or seeds for added healthy fat at each meal. You can add these to yogurt, salads, or vegetable dishes.  Marinate fish or vegetables using olive oil, lemon juice, garlic, and fresh herbs. Meal planning  Plan to eat 1 vegetarian meal one day each week. Try to work up to 2 vegetarian meals, if possible.  Eat seafood 2 or more times a week.  Have healthy snacks readily available, such as: ? Vegetable sticks with hummus. ? [place] yogurt. ? Fruit and nut trail mix.  Eat balanced meals throughout the week. This includes: ? Fruit: 2-3 servings a day ? Vegetables: 4-5 servings a day ? Low-fat dairy: 2 servings a day ? Fish, poultry, or lean meat: 1 serving a day ? Beans and legumes: 2 or more servings a week ? Nuts and seeds: 1-2 servings a day ? Whole grains: 6-8 servings a day ? Extra-virgin olive oil: 3-4 servings a day  Limit red meat and sweets to only a few servings a month   What are my food choices?  Mediterranean diet ? Recommended  Grains: Whole-grain pasta. Brown rice. Bulgar wheat. Polenta. Couscous. Whole-wheat bread. [place].  Vegetables: Artichokes. Beets. Broccoli. Cabbage. Carrots. Eggplant. Green beans. Chard. Kale. Spinach. Onions. Leeks. Peas. Squash. Tomatoes. Peppers. Radishes.  Fruits: Apples. Apricots. Avocado. Berries. Bananas. Cherries. Dates. Figs. Grapes. Lemons. Melon. Oranges. Peaches. Plums. Pomegranate.  Meats and other protein foods: Beans. Almonds. Sunflower seeds. Pine nuts. Peanuts. [place]. Salmon. Scallops. Shrimp. [place].  Tilapia. Clams. Oysters. Eggs.  Dairy: Low-fat milk. Cheese. Greek yogurt.  Beverages: Water. Red wine. Herbal tea.  Fats and oils: Extra virgin olive oil. Avocado oil. Grape seed oil.  Sweets and desserts: [place] yogurt with honey. Baked apples. Poached pears. Trail mix.  Seasoning and other foods: Basil. Cilantro. Coriander. Cumin. Mint. Parsley. Sage. Rosemary. Tarragon. Garlic. Oregano. Thyme. Pepper. Balsalmic vinegar. Tahini. Hummus. Tomato sauce. Olives. Mushrooms. ? Limit these  Grains: Prepackaged pasta or rice dishes. Prepackaged cereal with added sugar.  Vegetables: Deep fried potatoes (french fries).  Fruits: Fruit canned in syrup.  Meats and other protein foods: Beef. Pork. Lamb. Poultry with skin. Hot dogs. Berniece Salines.  Dairy: Ice cream. Sour cream. Whole milk.  Beverages: Juice. Sugar-sweetened soft drinks. Beer. Liquor and spirits.  Fats and oils: Butter. Canola oil. Vegetable oil. Beef fat (tallow). Lard.  Sweets and desserts: Cookies. Cakes. Pies. Candy.  Seasoning and other foods: Mayonnaise. Premade sauces and marinades. The items listed may not be a complete list. Talk with your dietitian about what dietary choices are right for you. Summary  The Mediterranean diet includes both food and lifestyle  choices.  Eat a variety of fresh fruits and vegetables, beans, nuts, seeds, and whole grains.  Limit the amount of red meat and sweets that you eat.  Talk with your health care provider about whether it is safe for you to drink red wine in moderation. This means 1 glass a day for nonpregnant women and 2 glasses a day for men. A glass of wine equals 5 oz (150 mL). This information is not intended to replace advice given to you by your health care provider. Make sure you discuss any questions you have with your health care provider. Document Revised: 06/06/2016 Document Reviewed: 05/30/2016 Elsevier Patient Education  [place].

## 2021-03-27 ENCOUNTER — Ambulatory Visit (INDEPENDENT_AMBULATORY_CARE_PROVIDER_SITE_OTHER): Payer: No Typology Code available for payment source | Admitting: Otolaryngology

## 2021-03-28 ENCOUNTER — Other Ambulatory Visit: Payer: Self-pay | Admitting: Osteopathic Medicine

## 2021-03-28 ENCOUNTER — Other Ambulatory Visit (HOSPITAL_BASED_OUTPATIENT_CLINIC_OR_DEPARTMENT_OTHER): Payer: Self-pay

## 2021-03-28 DIAGNOSIS — E063 Autoimmune thyroiditis: Secondary | ICD-10-CM

## 2021-03-28 MED ORDER — LEVOTHYROXINE SODIUM 75 MCG PO TABS
ORAL_TABLET | Freq: Every day | ORAL | 0 refills | Status: DC
  Filled 2021-03-28: qty 90, 90d supply, fill #0

## 2021-03-28 MED FILL — Amlodipine Besylate Tab 5 MG (Base Equivalent): ORAL | 90 days supply | Qty: 90 | Fill #0 | Status: AC

## 2021-03-28 MED FILL — Lisinopril Tab 20 MG: ORAL | 90 days supply | Qty: 90 | Fill #0 | Status: AC

## 2021-03-29 ENCOUNTER — Encounter (HOSPITAL_COMMUNITY): Payer: Self-pay | Admitting: Psychiatry

## 2021-03-29 ENCOUNTER — Telehealth (INDEPENDENT_AMBULATORY_CARE_PROVIDER_SITE_OTHER): Payer: No Typology Code available for payment source | Admitting: Psychiatry

## 2021-03-29 DIAGNOSIS — F333 Major depressive disorder, recurrent, severe with psychotic symptoms: Secondary | ICD-10-CM | POA: Diagnosis not present

## 2021-03-29 DIAGNOSIS — F411 Generalized anxiety disorder: Secondary | ICD-10-CM | POA: Diagnosis not present

## 2021-03-29 NOTE — Progress Notes (Signed)
[place] Follow up visit   Patient Identification: Shannon Santiago MRN:  [id number] Date of Evaluation:  03/29/2021 Referral Source: [facility] discharge Chief Complaint:  establish care, depression Visit Diagnosis:    ICD-10-CM   1. MDD (major depressive disorder), recurrent, severe, with psychosis ([place])  F33.3     2. GAD (generalized anxiety disorder)  F41.1      Virtual Visit via Video Note  I connected with Shannon Santiago on 03/29/21 at  3:30 PM EDT by a video enabled telemedicine application and verified that I am speaking with the correct person using two identifiers.  Location: Patient: work Provider: office   I discussed the limitations of evaluation and management by telemedicine and the availability of in person appointments. The patient expressed understanding and agreed to proceed.     I discussed the assessment and treatment plan with the patient. The patient was provided an opportunity to ask questions and all were answered. The patient agreed with the plan and demonstrated an understanding of the instructions.   The patient was advised to call back or seek an in-person evaluation if the symptoms worsen or if the condition fails to improve as anticipated.  I provided 10  minutes of non-face-to-face time during this encounter.    History of Present Illness: Patient is a 55 years old currently single Caucasian female initially referred after hospital discharge January 21 she was admitted for 4 days. 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 she lives with her dad does not have any kids  Patient is having buccal movements and we have cut down Latuda from 40 to 20 mg that has improved she wants to consider to stop it she believes Cymbalta by itself would be helpful she is not hearing voices or smelling smoke She is handling grief better her dad is sick   cymbalta 60 plus 30mg  helps depression  Aggravating factors; grief of mom dying 6  years ago,lonliness, dad sickness Modifying factors: pets Duration since young age  Alcohol abuse or using drugs denies Associated Signs/Symptoms:   Past Psychiatric History: depression, anxity  Previous Psychotropic Medications: Yes   Substance Abuse History in the last 12 months:  No.  Consequences of Substance Abuse: NA  Past Medical History:  Past Medical History:  Diagnosis Date   Allergy    Anxiety    Asthma    Depression    Family history of breast cancer    Family history of colon cancer    Family history of lung cancer    Family history of prostate cancer    Hyperlipidemia    Hypertension    Hypothyroidism    Prolonged PTT (partial thromboplastin time) 06/23/2019   Normal PT   Thyroid disease     Past Surgical History:  Procedure Laterality Date   ENDOMETRIAL BIOPSY  06/21/2019   EYE SURGERY     x 5   HYSTEROSCOPY WITH D & C N/A 10/27/2019   Procedure: DILATATION AND CURETTAGE /HYSTEROSCOPY;  Surgeon: Emily Filbert, MD;  Location: [place];  Service: Gynecology;  Laterality: N/A;    Family Psychiatric History: mom depression, anxiety, alcohol use  Family History:  Family History  Problem Relation Age of Onset   Hypertension Mother    Stroke Mother    Lung cancer Mother 71   Anxiety disorder Mother    Depression Mother    Alcohol abuse Mother    Skin cancer Father    Prostate cancer Father 14  Hypertension Father    Diabetes Father    Thyroid disease Maternal Aunt    Thyroid disease Maternal Uncle    Diabetes Brother    Hypertension Brother    Colon cancer Maternal Grandmother        dx 66s, d. 16s   Prostate cancer Paternal Grandfather 9   Breast cancer Cousin        dx 20s-30s    Social History:   Social History   Socioeconomic History   Marital status: Single    Spouse name: Not on file   Number of children: Not on file   Years of education: Not on file   Highest education level: Not on file  Occupational History    Not on file  Tobacco Use   Smoking status: Never   Smokeless tobacco: Never  Vaping Use   Vaping Use: Never used  Substance and Sexual Activity   Alcohol use: Yes    Comment: occ   Drug use: Never   Sexual activity: Not Currently    Birth control/protection: Abstinence  Other Topics Concern   Not on file  Social History Narrative   Not on file   Social Determinants of Health   Financial Resource Strain: Not on file  Food Insecurity: Not on file  Transportation Needs: Not on file  Physical Activity: Not on file  Stress: Not on file  Social Connections: Not on file     Allergies:   Allergies  Allergen Reactions   Morphine And Related Other (See Comments)    Numb hands and feet   Peanut Oil Other (See Comments)    Stomach cramping, hand/foot swelling   Penicillins Hives   Polymyxin B-Trimethoprim Other (See Comments), Photosensitivity and Swelling   Abilify [Aripiprazole] Other (See Comments)    Suicidal thoughts and actions   Citalopram Other (See Comments)    SSRI's ineffective - Citalopram, Fluoxetine, Sertraline, Excitalopram    Food Nausea Only, Swelling, Palpitations, Other (See Comments) and Cough    WHEAT   Prednisone Anxiety, Nausea Only, Other (See Comments) and Palpitations   Singulair [Montelukast Sodium] Other (See Comments)    Mood changes, worsening depression    Metabolic Disorder Labs: Lab Results  Component Value Date   HGBA1C 5.8 (H) 11/08/2020   MPG 119.76 11/08/2020   MPG 126 08/11/2020   Lab Results  Component Value Date   PROLACTIN 9.2 11/09/2020   PROLACTIN 10.6 06/18/2019   Lab Results  Component Value Date   CHOL 205 (H) 11/08/2020   TRIG 88 11/08/2020   HDL 50 11/08/2020   CHOLHDL 4.1 11/08/2020   VLDL 18 11/08/2020   LDLCALC 137 (H) 11/08/2020   LDLCALC 196 (H) 08/11/2020   Lab Results  Component Value Date   TSH 3.634 11/08/2020    Therapeutic Level Labs: No results found for: LITHIUM No results found for:  CBMZ No results found for: VALPROATE  Current Medications: Current Outpatient Medications  Medication Sig Dispense Refill   Albuterol Sulfate (PROAIR RESPICLICK) 510 (90 Base) MCG/ACT AEPB Inhale 1-2 puffs into the lungs every 4 (four) hours as needed. 1 each 4   ALPRAZolam (XANAX) 0.5 MG tablet Take 0.5-1 tablets (0.25-0.5 mg total) by mouth 2 (two) times daily as needed for anxiety. 15 tablet 0   amLODipine (NORVASC) 5 MG tablet TAKE 1 TABLET BY MOUTH ONCE DAILY 90 tablet 1   Ascorbic Acid (VITAMIN C) 1000 MG tablet Take 1,000 mg by mouth daily.     atorvastatin (LIPITOR) 40  MG tablet Take 1 tablet (40 mg total) by mouth daily. 90 tablet 3   b complex vitamins tablet Take 1 tablet by mouth daily.     busPIRone (BUSPAR) 15 MG tablet TAKE 1 TABLET BY MOUTH TWICE DAILY 180 tablet 3   CALCIUM PO Take 500 mg by mouth daily.     cetirizine (ZYRTEC) 10 MG tablet Take 10 mg by mouth daily.     Cholecalciferol (VITAMIN D3) 10 MCG (400 UNIT) tablet Take 400 Units by mouth daily.     DULoxetine (CYMBALTA) 30 MG capsule TAKE 1 CAPSULE BY MOUTH DAILY AT 6PM 90 capsule 0   DULoxetine (CYMBALTA) 60 MG capsule TAKE 1 CAPSULE BY MOUTH ONCE DAILY 90 capsule 0   EPINEPHrine 0.3 mg/0.3 mL IJ SOAJ injection Inject 0.3 mLs (0.3 mg total) into the muscle once as needed (anaphylaxis/allergic reaction). 1 each 3   Ferrous Sulfate (IRON PO) Take 325 mg by mouth daily.     fluticasone (FLONASE) 50 MCG/ACT nasal spray Place 1 spray into both nostrils daily. 16 g 5   Fluticasone-Salmeterol (ADVAIR) 250-50 MCG/DOSE AEPB INHALE 1 PUFF INTO THE LUNGS 2 (TWO) TIMES DAILY. 60 each 3   Ginger, Zingiber officinalis, (GINGER ROOT PO) Take by mouth.     levothyroxine (SYNTHROID) 75 MCG tablet TAKE 1 TABLET BY MOUTH DAILY BEFORE BREAKFAST 90 tablet 0   lisinopril (ZESTRIL) 20 MG tablet TAKE 1 TABLET BY MOUTH ONCE DAILY 90 tablet 1   lurasidone (LATUDA) 20 MG TABS tablet Take 1 tablet (20 mg total) by mouth daily. 60 tablet 0    Magnesium 500 MG CAPS Take 500 mg by mouth daily.     Multiple Vitamin (MULTIVITAMIN) capsule Take 1 capsule by mouth daily.     Omega-3 Fatty Acids (FISH OIL) 1000 MG CAPS Take 1 capsule by mouth 2 (two) times daily.     Probiotic Product (PROBIOTIC ADVANCED PO) Take 1 capsule by mouth daily.     RESTASIS 0.05 % ophthalmic emulsion 1 drop 2 (two) times daily.     No current facility-administered medications for this visit.     Psychiatric Specialty Exam: Review of Systems  Cardiovascular:  Negative for chest pain.  Psychiatric/Behavioral:  Negative for agitation and self-injury.    Last menstrual period 06/18/2017.There is no height or weight on file to calculate BMI.  General Appearance: Casual  Eye Contact:  Fair  Speech:  Clear and Coherent  Volume:  Normal  Mood:  fair  Affect:  Congruent  Thought Process:  Goal Directed  Orientation:  Full (Time, Place, and Person)  Thought Content:  Rumination  Suicidal Thoughts:  No  Homicidal Thoughts:  No  Memory:  Immediate;   Fair Recent;   Fair  Judgement:  Fair  Insight:  Fair  Psychomotor Activity:  Normal  Concentration:  Concentration: Fair and Attention Span: Fair  Recall:  [employer] of Knowledge:Good  Language: Good  Akathisia:  No  Handed:    AIMS (if indicated):  No involuntary movements  Assets:  Desire for Improvement  ADL's:  Intact  Cognition: WNL  Sleep:   variable to fair   Screenings: AUDIT    Flowsheet Row Admission (Discharged) from 11/08/2020 in [facility]  Alcohol Use Disorder Identification Test Final Score (AUDIT) 3      GAD-7    Flowsheet Row Video Visit from 09/11/2020 in [facility] Video Visit from 08/25/2020 in [place]  [facility] [employer] Office Visit from 08/11/2020 in [place] Office Visit from 11/24/2019 in [place] Video  Visit from 09/10/2019 in [place]  Total GAD-7 Score 16 16 16 15 16       PHQ2-9    Flowsheet Row Video Visit from 11/30/2020 in [place] Video Visit from 09/11/2020 in [place] Video Visit from 08/25/2020 in [place] Office Visit from 08/11/2020 in [place] Visit from 11/24/2019 in [place]  PHQ-2 Total Score 3 4 6 6 4   PHQ-9 Total Score 9 20 23 25 13       Flowsheet Row Video Visit from 03/29/2021 in [place] Video Visit from 03/01/2021 in [place] Video Visit from 12/28/2020 in [place] No Risk No Risk Error: Q3, 4, or 5 should not be populated when Q2 is No       Assessment and Plan: as follows  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

## 2021-04-01 MED FILL — Buspirone HCl Tab 15 MG: ORAL | 90 days supply | Qty: 180 | Fill #0 | Status: AC

## 2021-04-02 ENCOUNTER — Other Ambulatory Visit (HOSPITAL_BASED_OUTPATIENT_CLINIC_OR_DEPARTMENT_OTHER): Payer: Self-pay

## 2021-04-09 ENCOUNTER — Telehealth: Payer: Self-pay

## 2021-04-09 ENCOUNTER — Encounter: Payer: Self-pay | Admitting: Osteopathic Medicine

## 2021-04-09 DIAGNOSIS — E038 Other specified hypothyroidism: Secondary | ICD-10-CM

## 2021-04-09 DIAGNOSIS — Z0000 Encounter for general adult medical examination without abnormal findings: Secondary | ICD-10-CM

## 2021-04-09 DIAGNOSIS — I10 Essential (primary) hypertension: Secondary | ICD-10-CM

## 2021-04-09 DIAGNOSIS — E7801 Familial hypercholesterolemia: Secondary | ICD-10-CM

## 2021-04-09 DIAGNOSIS — R7303 Prediabetes: Secondary | ICD-10-CM

## 2021-04-09 DIAGNOSIS — E782 Mixed hyperlipidemia: Secondary | ICD-10-CM

## 2021-04-09 NOTE — Telephone Encounter (Signed)
Pt left a vm msg stating she has an upcoming appt on 04/16/21. Requesting annual labs to complete before her appt. Annual labs ordered.

## 2021-04-09 NOTE — Telephone Encounter (Signed)
MyChart message sent to pt re: lab orders avialable

## 2021-04-13 LAB — CBC WITH DIFFERENTIAL/PLATELET
Absolute Monocytes: 618 cells/uL (ref 200–950)
Basophils Absolute: 50 cells/uL (ref 0–200)
Basophils Relative: 0.7 %
Eosinophils Absolute: 163 cells/uL (ref 15–500)
Eosinophils Relative: 2.3 %
HCT: 42.2 % (ref 35.0–45.0)
Hemoglobin: 14.2 g/dL (ref 11.7–15.5)
Lymphs Abs: 1633 cells/uL (ref 850–3900)
MCH: 32.8 pg (ref 27.0–33.0)
MCHC: 33.6 g/dL (ref 32.0–36.0)
MCV: 97.5 fL (ref 80.0–100.0)
MPV: 10 fL (ref 7.5–12.5)
Monocytes Relative: 8.7 %
Neutro Abs: 4636 cells/uL (ref 1500–7800)
Neutrophils Relative %: 65.3 %
Platelets: 403 10*3/uL — ABNORMAL HIGH (ref 140–400)
RBC: 4.33 10*6/uL (ref 3.80–5.10)
RDW: 12.9 % (ref 11.0–15.0)
Total Lymphocyte: 23 %
WBC: 7.1 10*3/uL (ref 3.8–10.8)

## 2021-04-13 LAB — COMPLETE METABOLIC PANEL WITH GFR
AG Ratio: 1.7 (calc) (ref 1.0–2.5)
ALT: 15 U/L (ref 6–29)
AST: 12 U/L (ref 10–35)
Albumin: 4.5 g/dL (ref 3.6–5.1)
Alkaline phosphatase (APISO): 73 U/L (ref 37–153)
BUN: 18 mg/dL (ref 7–25)
CO2: 26 mmol/L (ref 20–32)
Calcium: 9.9 mg/dL (ref 8.6–10.4)
Chloride: 102 mmol/L (ref 98–110)
Creat: 1.03 mg/dL (ref 0.50–1.05)
GFR, Est African American: 71 mL/min/{1.73_m2} (ref >=60)
GFR, Est Non African American: 61 mL/min/{1.73_m2} (ref >=60)
Globulin: 2.6 g/dL (calc) (ref 1.9–3.7)
Glucose, Bld: 105 mg/dL — ABNORMAL HIGH (ref 65–99)
Potassium: 4.9 mmol/L (ref 3.5–5.3)
Sodium: 140 mmol/L (ref 135–146)
Total Bilirubin: 0.4 mg/dL (ref 0.2–1.2)
Total Protein: 7.1 g/dL (ref 6.1–8.1)

## 2021-04-13 LAB — HEMOGLOBIN A1C
Hgb A1c MFr Bld: 5.6 % of total Hgb (ref <5.7)
Mean Plasma Glucose: 114 mg/dL
eAG (mmol/L): 6.3 mmol/L

## 2021-04-13 LAB — LIPID PANEL W/REFLEX DIRECT LDL
Cholesterol: 174 mg/dL (ref <200)
HDL: 40 mg/dL — ABNORMAL LOW (ref >=50)
LDL Cholesterol (Calc): 113 mg/dL (calc) — ABNORMAL HIGH
Non-HDL Cholesterol (Calc): 134 mg/dL (calc) — ABNORMAL HIGH (ref <130)
Total CHOL/HDL Ratio: 4.4 (calc) (ref <5.0)
Triglycerides: 103 mg/dL (ref <150)

## 2021-04-13 LAB — TSH: TSH: 2.6 mIU/L

## 2021-04-16 ENCOUNTER — Ambulatory Visit (INDEPENDENT_AMBULATORY_CARE_PROVIDER_SITE_OTHER): Payer: No Typology Code available for payment source | Admitting: Physician Assistant

## 2021-04-16 ENCOUNTER — Other Ambulatory Visit: Payer: Self-pay

## 2021-04-16 ENCOUNTER — Encounter: Payer: No Typology Code available for payment source | Admitting: Osteopathic Medicine

## 2021-04-16 ENCOUNTER — Encounter: Payer: Self-pay | Admitting: Physician Assistant

## 2021-04-16 VITALS — BP 119/80 | HR 101 | Ht 61.0 in | Wt 170.0 lb

## 2021-04-16 DIAGNOSIS — Z23 Encounter for immunization: Secondary | ICD-10-CM

## 2021-04-16 DIAGNOSIS — Z1231 Encounter for screening mammogram for malignant neoplasm of breast: Secondary | ICD-10-CM

## 2021-04-16 DIAGNOSIS — Z0000 Encounter for general adult medical examination without abnormal findings: Secondary | ICD-10-CM

## 2021-04-16 DIAGNOSIS — Z1159 Encounter for screening for other viral diseases: Secondary | ICD-10-CM | POA: Diagnosis not present

## 2021-04-16 DIAGNOSIS — R300 Dysuria: Secondary | ICD-10-CM

## 2021-04-16 DIAGNOSIS — N3000 Acute cystitis without hematuria: Secondary | ICD-10-CM

## 2021-04-16 LAB — POCT URINALYSIS DIP (CLINITEK)
Bilirubin, UA: NEGATIVE
Blood, UA: NEGATIVE
Glucose, UA: NEGATIVE mg/dL
Nitrite, UA: NEGATIVE
POC PROTEIN,UA: NEGATIVE
Spec Grav, UA: 1.01 (ref 1.010–1.025)
Urobilinogen, UA: 0.2 E.U./dL
pH, UA: 6 (ref 5.0–8.0)

## 2021-04-16 NOTE — Patient Instructions (Addendum)
Zoster Vaccine, Recombinant injection What is this medication? ZOSTER VACCINE (ZOS ter vak SEEN) is a vaccine used to reduce the risk of getting shingles. This vaccine is not used to treat shingles or nerve pain fromshingles. This medicine may be used for other purposes; ask your health care provider orpharmacist if you have questions. COMMON BRAND NAME(S): [facility] What should I tell my care team before I take this medication? They need to know if you have any of these conditions: cancer immune system problems an unusual or allergic reaction to Zoster vaccine, other medications, foods, dyes, or preservatives pregnant or trying to get pregnant breast-feeding How should I use this medication? This vaccine is injected into a muscle. It is given by a health care provider. A copy of Vaccine Information Statements will be given before each vaccination. Be sure to read this information carefully each time. This sheet may changeoften. Talk to your health care provider about the use of this vaccine in children.This vaccine is not approved for use in children. Overdosage: If you think you have taken too much of this medicine contact apoison control center or emergency room at once. NOTE: This medicine is only for you. Do not share this medicine with others. What if I miss a dose? Keep appointments for follow-up (booster) doses. It is important not to miss your dose. Call your health care provider if you are unable to keep anappointment. What may interact with this medication? medicines that suppress your immune system medicines to treat cancer steroid medicines like prednisone or cortisone This list may not describe all possible interactions. Give your health care provider a list of all the medicines, herbs, non-prescription drugs, or dietary supplements you use. Also tell them if you smoke, drink alcohol, or use illegaldrugs. Some items may interact with your medicine. What should I watch for while  using this medication? Visit your health care provider regularly. This vaccine, like all vaccines, may not fully protect everyone. What side effects may I notice from receiving this medication? Side effects that you should report to your doctor or health care professionalas soon as possible: allergic reactions (skin rash, itching or hives; swelling of the face, lips, or tongue) trouble breathing Side effects that usually do not require medical attention (report these toyour doctor or health care professional if they continue or are bothersome): chills headache fever nausea pain, redness, or irritation at site where injected tiredness vomiting This list may not describe all possible side effects. Call your doctor for medical advice about side effects. You may report side effects to FDA at1-800-FDA-1088. Where should I keep my medication? This vaccine is only given by a health care provider. It will not be stored athome. NOTE: This sheet is a summary. It may not cover all possible information. If you have questions about this medicine, talk to your doctor, pharmacist, orhealth care provider.  2022 Elsevier/Gold Standard (2019-11-12 16:23:07)   Preventive Care 15-20 Years Old, Female Preventive care refers to lifestyle choices and visits with your health care provider that can promote health and wellness. This includes: A yearly physical exam. This is also called an annual wellness visit. Regular dental and eye exams. Immunizations. Screening for certain conditions. Healthy lifestyle choices, such as: Eating a healthy diet. Getting regular exercise. Not using drugs or products that contain nicotine and tobacco. Limiting alcohol use. What can I expect for my preventive care visit? Physical exam Your health care provider will check your: Height and weight. These may be used to calculate your  BMI (body mass index). BMI is a measurement that tells if you are at a healthy weight. Heart  rate and blood pressure. Body temperature. Skin for abnormal spots. Counseling Your health care provider may ask you questions about your: Past medical problems. Family's medical history. Alcohol, tobacco, and drug use. Emotional well-being. Home life and relationship well-being. Sexual activity. Diet, exercise, and sleep habits. Work and work Statistician. Access to firearms. Method of birth control. Menstrual cycle. Pregnancy history. What immunizations do I need?  Vaccines are usually given at various ages, according to a schedule. Your health care provider will recommend vaccines for you based on your age, medicalhistory, and lifestyle or other factors, such as travel or where you work. What tests do I need? Blood tests Lipid and cholesterol levels. These may be checked every 5 years, or more often if you are over 21 years old. Hepatitis C test. Hepatitis B test. Screening Lung cancer screening. You may have this screening every year starting at age 75 if you have a 30-pack-year history of smoking and currently smoke or have quit within the past 15 years. Colorectal cancer screening. All adults should have this screening starting at age 51 and continuing until age 48. Your health care provider may recommend screening at age 48 if you are at increased risk. You will have tests every 1-10 years, depending on your results and the type of screening test. Diabetes screening. This is done by checking your blood sugar (glucose) after you have not eaten for a while (fasting). You may have this done every 1-3 years. Mammogram. This may be done every 1-2 years. Talk with your health care provider about when you should start having regular mammograms. This may depend on whether you have a family history of breast cancer. BRCA-related cancer screening. This may be done if you have a family history of breast, ovarian, tubal, or peritoneal cancers. Pelvic exam and Pap test. This may be  done every 3 years starting at age 31. Starting at age 18, this may be done every 5 years if you have a Pap test in combination with an HPV test. Other tests STD (sexually transmitted disease) testing, if you are at risk. Bone density scan. This is done to screen for osteoporosis. You may have this scan if you are at high risk for osteoporosis. Talk with your health care provider about your test results, treatment options,and if necessary, the need for more tests. Follow these instructions at home: Eating and drinking  Eat a diet that includes fresh fruits and vegetables, whole grains, lean protein, and low-fat dairy products. Take vitamin and mineral supplements as recommended by your health care provider. Do not drink alcohol if: Your health care provider tells you not to drink. You are pregnant, may be pregnant, or are planning to become pregnant. If you drink alcohol: Limit how much you have to 0-1 drink a day. Be aware of how much alcohol is in your drink. In the [place] one drink equals one 12 oz bottle of beer (355 mL), one 5 oz glass of wine (148 mL), or one 1 oz glass of hard liquor (44 mL).  Lifestyle Take daily care of your teeth and gums. Brush your teeth every morning and night with fluoride toothpaste. Floss one time each day. Stay active. Exercise for at least 30 minutes 5 or more days each week. Do not use any products that contain nicotine or tobacco, such as cigarettes, e-cigarettes, and chewing tobacco. If you need help quitting,  ask your health care provider. Do not use drugs. If you are sexually active, practice safe sex. Use a condom or other form of protection to prevent STIs (sexually transmitted infections). If you do not wish to become pregnant, use a form of birth control. If you plan to become pregnant, see your health care provider for a prepregnancy visit. If told by your health care provider, take low-dose aspirin daily starting at age 79. Find healthy ways to  cope with stress, such as: Meditation, yoga, or listening to music. Journaling. Talking to a trusted person. Spending time with friends and family. Safety Always wear your seat belt while driving or riding in a vehicle. Do not drive: If you have been drinking alcohol. Do not ride with someone who has been drinking. When you are tired or distracted. While texting. Wear a helmet and other protective equipment during sports activities. If you have firearms in your house, make sure you follow all gun safety procedures. What's next? Visit your health care provider once a year for an annual wellness visit. Ask your health care provider how often you should have your eyes and teeth checked. Stay up to date on all vaccines. This information is not intended to replace advice given to you by your health care provider. Make sure you discuss any questions you have with your healthcare provider. Document Revised: 07/11/2020 Document Reviewed: 06/18/2018 Elsevier Patient Education  2022 [employer].

## 2021-04-16 NOTE — Progress Notes (Signed)
Established Patient Office Visit  Subjective:  Patient ID: Shannon Santiago, female    DOB: [birth year]  Age: 55 y.o. MRN: [id number]  CC: No chief complaint on file.   HPI Shannon Santiago presents for annual physical exam  Completed routine labs 4 days ago A1C normal at 5.6 CMP unremarkable TSH normal 2.6 CBC with mild thrombocytopenia at 403, stable compared to 434, 5 months ago Lipid panel with normal TC and LDL and slightly low HDL  The 10-year ASCVD risk score Shannon Bussing DC Jr., et al., 2013) is: 2.7%   Values used to calculate the score:     Age: 18 years     Sex: Female     Is Non-Hispanic African American: No     Diabetic: No     Tobacco smoker: No     Systolic Blood Pressure: 413 mmHg     Is BP treated: Yes     HDL Cholesterol: 40 mg/dL     Total Cholesterol: 174 mg/dL  She is concerned that she could have a UTI. About 1 month ago she developed dysuria, waxing and waning. Urine has been cloudy and more frequent urination.  No prior hx of pyelonephritis Denies fever, chills, persistent flank pain/vomiting  She is due for 2nd Shingrix. Last dose >1 year ago. No adverse effects with prior dose  She is following with Psychiatry for depression  Past Medical History:  Diagnosis Date   Allergy    Anxiety    Asthma    Depression    Family history of breast cancer    Family history of colon cancer    Family history of lung cancer    Family history of prostate cancer    Hyperlipidemia    Hypertension    Hypothyroidism    Prolonged PTT (partial thromboplastin time) 06/23/2019   Normal PT   Thyroid disease     Past Surgical History:  Procedure Laterality Date   ENDOMETRIAL BIOPSY  06/21/2019   EYE SURGERY     x 5   HYSTEROSCOPY WITH D & C N/A 10/27/2019   Procedure: DILATATION AND CURETTAGE /HYSTEROSCOPY;  Surgeon: Shannon Filbert, MD;  Location: [facility];  Service: Gynecology;  Laterality: N/A;    Family History  Problem Relation Age of Onset    Hypertension Mother    Stroke Mother    Lung cancer Mother 71   Anxiety disorder Mother    Depression Mother    Alcohol abuse Mother    Skin cancer Father    Prostate cancer Father 84   Hypertension Father    Diabetes Father    Thyroid disease Maternal Aunt    Thyroid disease Maternal Uncle    Diabetes Brother    Hypertension Brother    Colon cancer Maternal Grandmother        dx 44s, d. 60s   Prostate cancer Paternal Grandfather 54   Breast cancer Cousin        dx 20s-30s    Social History   Socioeconomic History   Marital status: Single    Spouse name: Not on file   Number of children: Not on file   Years of education: Not on file   Highest education level: Not on file  Occupational History   Not on file  Tobacco Use   Smoking status: Never   Smokeless tobacco: Never  Vaping Use   Vaping Use: Never used  Substance and Sexual Activity   Alcohol use: Yes  Comment: occ   Drug use: Never   Sexual activity: Not Currently    Birth control/protection: Abstinence  Other Topics Concern   Not on file  Social History Narrative   Not on file   Social Determinants of Health   Financial Resource Strain: Not on file  Food Insecurity: Not on file  Transportation Needs: Not on file  Physical Activity: Not on file  Stress: Not on file  Social Connections: Not on file  Intimate Partner Violence: Not on file    Outpatient Medications Prior to Visit  Medication Sig Dispense Refill   Albuterol Sulfate (PROAIR RESPICLICK) 706 (90 Base) MCG/ACT AEPB Inhale 1-2 puffs into the lungs every 4 (four) hours as needed. 1 each 4   ALPRAZolam (XANAX) 0.5 MG tablet Take 0.5-1 tablets (0.25-0.5 mg total) by mouth 2 (two) times daily as needed for anxiety. 15 tablet 0   amLODipine (NORVASC) 5 MG tablet TAKE 1 TABLET BY MOUTH ONCE DAILY 90 tablet 1   Ascorbic Acid (VITAMIN C) 1000 MG tablet Take 1,000 mg by mouth daily.     atorvastatin (LIPITOR) 40 MG tablet Take 1 tablet (40 mg  total) by mouth daily. 90 tablet 3   b complex vitamins tablet Take 1 tablet by mouth daily.     busPIRone (BUSPAR) 15 MG tablet TAKE 1 TABLET BY MOUTH TWICE DAILY 180 tablet 3   CALCIUM PO Take 500 mg by mouth daily.     cetirizine (ZYRTEC) 10 MG tablet Take 10 mg by mouth daily.     Cholecalciferol (VITAMIN D3) 10 MCG (400 UNIT) tablet Take 400 Units by mouth daily.     DULoxetine (CYMBALTA) 30 MG capsule TAKE 1 CAPSULE BY MOUTH DAILY AT 6PM 90 capsule 0   DULoxetine (CYMBALTA) 60 MG capsule TAKE 1 CAPSULE BY MOUTH ONCE DAILY 90 capsule 0   EPINEPHrine 0.3 mg/0.3 mL IJ SOAJ injection Inject 0.3 mLs (0.3 mg total) into the muscle once as needed (anaphylaxis/allergic reaction). 1 each 3   Ferrous Sulfate (IRON PO) Take 325 mg by mouth daily.     fluticasone (FLONASE) 50 MCG/ACT nasal spray Place 1 spray into both nostrils daily. 16 g 5   Ginger, Zingiber officinalis, (GINGER ROOT PO) Take by mouth.     levothyroxine (SYNTHROID) 75 MCG tablet TAKE 1 TABLET BY MOUTH DAILY BEFORE BREAKFAST 90 tablet 0   lisinopril (ZESTRIL) 20 MG tablet TAKE 1 TABLET BY MOUTH ONCE DAILY 90 tablet 1   Magnesium 500 MG CAPS Take 500 mg by mouth daily.     Multiple Vitamin (MULTIVITAMIN) capsule Take 1 capsule by mouth daily.     Omega-3 Fatty Acids (FISH OIL) 1000 MG CAPS Take 1 capsule by mouth 2 (two) times daily.     Probiotic Product (PROBIOTIC ADVANCED PO) Take 1 capsule by mouth daily.     RESTASIS 0.05 % ophthalmic emulsion 1 drop 2 (two) times daily.     ARIPiprazole (ABILIFY) 10 MG tablet Take 1 tablet (10 mg total) by mouth daily. 90 tablet 1   buPROPion (WELLBUTRIN XL) 150 MG 24 hr tablet Take 150 mg by mouth daily.     Fluticasone-Salmeterol (ADVAIR) 250-50 MCG/DOSE AEPB INHALE 1 PUFF INTO THE LUNGS 2 (TWO) TIMES DAILY. 60 each 3   lurasidone (LATUDA) 20 MG TABS tablet Take 1 tablet (20 mg total) by mouth daily. 60 tablet 0   traZODone (DESYREL) 50 MG tablet TAKE 1 TABLET BY MOUTH EVERY NIGHT AT BEDTIME  30 tablet 0  No facility-administered medications prior to visit.    Allergies  Allergen Reactions   Morphine And Related Other (See Comments)    Numb hands and feet   Peanut Oil Other (See Comments)    Stomach cramping, hand/foot swelling   Penicillins Hives   Polymyxin B-Trimethoprim Other (See Comments), Photosensitivity and Swelling   Abilify [Aripiprazole] Other (See Comments)    Suicidal thoughts and actions   Lurasidone Hcl     Tardive dyskinesia, akathisia   Citalopram Other (See Comments)    SSRI's ineffective - Citalopram, Fluoxetine, Sertraline, Excitalopram    Food Nausea Only, Swelling, Palpitations, Other (See Comments) and Cough    WHEAT   Prednisone Anxiety, Nausea Only, Other (See Comments) and Palpitations   Singulair [Montelukast Sodium] Other (See Comments)    Mood changes, worsening depression    ROS Review of Systems  Gastrointestinal:  Negative for abdominal pain and vomiting.  Genitourinary:  Positive for dysuria (intermittent) and frequency. Negative for difficulty urinating, flank pain, hematuria and pelvic pain.  Psychiatric/Behavioral:  Positive for dysphoric mood.   All other systems reviewed and are negative.    Objective:    Physical Exam Vitals reviewed.    BP 119/80   Pulse (!) 101   Ht 5\' 1"  (1.549 m)   Wt 170 lb (77.1 kg)   LMP 06/18/2017   BMI 32.12 kg/m   BP Readings from Last 3 Encounters:  04/16/21 119/80  03/05/21 112/78  02/16/21 118/75   Pulse Readings from Last 3 Encounters:  04/16/21 (!) 101  03/05/21 88  02/16/21 79   General Appearance:  Alert, cooperative, no distress, appropriate for age                            Head:  Normocephalic, without obvious abnormality                             Eyes:  PERRL, EOM's intact, conjunctiva and cornea clear                             Ears:  TM pearly gray color and semitransparent, external ear canals normal, both ears                            Nose:  Nares  symmetrical, mucosa pink                          Throat:  Lips, tongue, and mucosa are moist, pink, and intact; oropharynx clear, uvula midline; good dentition                             Neck:  Supple; symmetrical, trachea midline, no adenopathy; thyroid: no enlargement, symmetric, no tenderness/mass/nodules                             Back:  Symmetrical, no curvature, ROM normal               Chest/Breast:  deferred                           Lungs:  Clear to auscultation bilaterally, respirations unlabored  Heart:  normal rate at 90 bpm & regular rhythm, S1 and S2 normal, no murmurs, rubs, or gallops                     Abdomen:  Soft, non-tender, no mass or organomegaly; no CVA tenderness              Genitourinary:  deferred         Musculoskeletal:  Tone and strength strong and symmetrical, all extremities; no joint pain or edema, normal gait and station,                             Lymphatic:  No adenopathy             Skin/Hair/Nails:  Skin warm, dry and intact, no rashes or abnormal dyspigmentation on limited exam                   Neurologic:  Alert and oriented x3, no cranial nerve deficits, sensation grossly intact, normal gait and station, no tremor Psych: well-groomed, cooperative, good eye contact, depressed mood, affect mood-congruent, speech is articulate, and thought processes clear and goal-directed   Health Maintenance Due  Topic Date Due   COVID-19 Vaccine (3 - Booster for Pfizer series) 04/29/2020    There are no preventive care reminders to display for this patient.  Lab Results  Component Value Date   TSH 2.60 04/12/2021   Lab Results  Component Value Date   WBC 7.1 04/12/2021   HGB 14.2 04/12/2021   HCT 42.2 04/12/2021   MCV 97.5 04/12/2021   PLT 403 (H) 04/12/2021   Lab Results  Component Value Date   NA 140 04/12/2021   K 4.9 04/12/2021   CO2 26 04/12/2021   GLUCOSE 105 (H) 04/12/2021   BUN 18 04/12/2021   CREATININE 1.03  04/12/2021   BILITOT 0.4 04/12/2021   ALKPHOS 78 11/07/2020   AST 12 04/12/2021   ALT 15 04/12/2021   PROT 7.1 04/12/2021   ALBUMIN 4.2 11/07/2020   CALCIUM 9.9 04/12/2021   ANIONGAP 11 11/07/2020   Lab Results  Component Value Date   CHOL 174 04/12/2021   Lab Results  Component Value Date   HDL 40 (L) 04/12/2021   Lab Results  Component Value Date   LDLCALC 113 (H) 04/12/2021   Lab Results  Component Value Date   TRIG 103 04/12/2021   Lab Results  Component Value Date   CHOLHDL 4.4 04/12/2021   Lab Results  Component Value Date   HGBA1C 5.6 04/12/2021      Assessment & Plan:   Problem List Items Addressed This Visit   None Visit Diagnoses     Encounter for annual physical exam    -  Primary   Need for shingles vaccine       Relevant Orders   Varicella-zoster vaccine IM (Shingrix) (Completed)   Encounter for hepatitis C screening test for low risk patient       Relevant Orders   Hepatitis C Antibody (Completed)   Dysuria       Relevant Orders   POCT URINALYSIS DIP (CLINITEK) (Completed)   Urine Culture (Completed)   Breast cancer screening by mammogram       Relevant Orders   MM DIGITAL SCREENING BILATERAL   Acute cystitis without hematuria       Relevant Medications   nitrofurantoin, macrocrystal-monohydrate, (MACROBID) 100 MG capsule  Health maintenance personally reviewed Shringix #2 given to complete the series Reviewed fasting labs dated 04/12/21 with patient and all questions answered Hep C antibody ordered for low risk screening  UA unremarkable apart from small leuks (which could represent contamination) and incidental small ketones - patient reports she is adhering to a ketogenic diet for weight loss. Symptoms have been waxing and waning x 1 month with no signs/symptoms of pyelonephritis. Urine culture pending and will treat definitively if positive  Meds ordered this encounter  Medications   nitrofurantoin, macrocrystal-monohydrate,  (MACROBID) 100 MG capsule    Sig: Take 1 capsule (100 mg total) by mouth 2 (two) times daily for 5 days.    Dispense:  10 capsule    Refill:  0    Order Specific Question:   Supervising Provider    Answer:   Emeterio Reeve [[id number]]    Follow-up: Return in about 1 year (around 04/16/2022) for CPE.    Trixie Dredge, [place]

## 2021-04-17 LAB — HEPATITIS C ANTIBODY
Hepatitis C Ab: NONREACTIVE
SIGNAL TO CUT-OFF: 0.01 (ref <1.00)

## 2021-04-18 ENCOUNTER — Encounter: Payer: Self-pay | Admitting: Osteopathic Medicine

## 2021-04-19 LAB — URINE CULTURE
MICRO NUMBER:: 12057200
SPECIMEN QUALITY:: ADEQUATE

## 2021-04-20 ENCOUNTER — Other Ambulatory Visit (HOSPITAL_BASED_OUTPATIENT_CLINIC_OR_DEPARTMENT_OTHER): Payer: Self-pay

## 2021-04-20 MED ORDER — NITROFURANTOIN MONOHYD MACRO 100 MG PO CAPS
100.0000 mg | ORAL_CAPSULE | Freq: Two times a day (BID) | ORAL | 0 refills | Status: AC
  Filled 2021-04-20: qty 10, 5d supply, fill #0

## 2021-04-20 NOTE — Progress Notes (Signed)
Urine culture + 50-100K Klebsiella Resistant to Ampicillin and Cefazolin Patient has allergy to PCN Rx for Macrobid 100 mg bid x 5 days sent to pharmacy Instructions sent to patient via [facility]

## 2021-04-26 ENCOUNTER — Other Ambulatory Visit: Payer: Self-pay

## 2021-04-26 ENCOUNTER — Ambulatory Visit (INDEPENDENT_AMBULATORY_CARE_PROVIDER_SITE_OTHER): Payer: No Typology Code available for payment source

## 2021-04-26 DIAGNOSIS — Z1231 Encounter for screening mammogram for malignant neoplasm of breast: Secondary | ICD-10-CM | POA: Diagnosis not present

## 2021-05-23 ENCOUNTER — Telehealth (INDEPENDENT_AMBULATORY_CARE_PROVIDER_SITE_OTHER): Payer: No Typology Code available for payment source | Admitting: Psychiatry

## 2021-05-23 ENCOUNTER — Other Ambulatory Visit (HOSPITAL_BASED_OUTPATIENT_CLINIC_OR_DEPARTMENT_OTHER): Payer: Self-pay

## 2021-05-23 ENCOUNTER — Encounter (HOSPITAL_COMMUNITY): Payer: Self-pay | Admitting: Psychiatry

## 2021-05-23 DIAGNOSIS — F333 Major depressive disorder, recurrent, severe with psychotic symptoms: Secondary | ICD-10-CM | POA: Diagnosis not present

## 2021-05-23 DIAGNOSIS — F411 Generalized anxiety disorder: Secondary | ICD-10-CM | POA: Diagnosis not present

## 2021-05-23 MED ORDER — DULOXETINE HCL 30 MG PO CPEP
ORAL_CAPSULE | ORAL | 0 refills | Status: DC
  Filled 2021-05-23: qty 90, 90d supply, fill #0

## 2021-05-23 NOTE — Progress Notes (Signed)
[place] Follow up visit   Patient Identification: Shannon Santiago MRN:  [id number] Date of Evaluation:  05/23/2021 Referral Source: [facility] discharge Chief Complaint:  establish care, depression Visit Diagnosis:    ICD-10-CM   1. MDD (major depressive disorder), recurrent, severe, with psychosis ([place])  F33.3     2. GAD (generalized anxiety disorder)  F41.1     Virtual Visit via Video Note  I connected with Shannon Santiago on 05/23/21 at 11:00 AM EDT by a video enabled telemedicine application and verified that I am speaking with the correct person using two identifiers.  Location: Patient: home Provider: home office   I discussed the limitations of evaluation and management by telemedicine and the availability of in person appointments. The patient expressed understanding and agreed to proceed.      I discussed the assessment and treatment plan with the patient. The patient was provided an opportunity to ask questions and all were answered. The patient agreed with the plan and demonstrated an understanding of the instructions.   The patient was advised to call back or seek an in-person evaluation if the symptoms worsen or if the condition fails to improve as anticipated.  I provided 15  minutes of non-face-to-face time during this encounter.       History of Present Illness: Patient is a 55 years old currently single Caucasian female initially referred after hospital discharge January 21 she was admitted for 4 days. 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  She is still on Cymbalta 30+60 struggling with her dad going through renal dialysis and medical comorbidity patient is working on his paperwork power of attorney and this can be stressful but she is handling it with some support of her  brother  She does not believe medication needs to be adjusted as it is situational anxiety and stress her dad is in rehab currently    Aggravating factors; grief of mom dying 6 years ago,lonliness, dad sickness Modifying factors: Pets Duration since young age  Alcohol abuse or using drugs denies Associated Signs/Symptoms:   Past Psychiatric History: depression, anxity  Previous Psychotropic Medications: Yes   Substance Abuse History in the last 12 months:  No.  Consequences of Substance Abuse: NA  Past Medical History:  Past Medical History:  Diagnosis Date   Allergy    Anxiety    Asthma    Depression    Family history of breast cancer    Family history of colon cancer    Family history of lung cancer    Family history of prostate cancer    Hyperlipidemia    Hypertension    Hypothyroidism    Prolonged PTT (partial thromboplastin time) 06/23/2019   Normal PT   Thyroid disease     Past Surgical History:  Procedure Laterality Date   ENDOMETRIAL BIOPSY  06/21/2019   EYE SURGERY     x 5   HYSTEROSCOPY WITH D & C N/A 10/27/2019   Procedure: DILATATION AND CURETTAGE /HYSTEROSCOPY;  Surgeon: Emily Filbert, MD;  Location: [place];  Service: Gynecology;  Laterality: N/A;    Family Psychiatric History: mom depression, anxiety, alcohol use  Family History:  Family History  Problem Relation Age of Onset   Hypertension Mother    Stroke Mother    Lung cancer Mother 24  Anxiety disorder Mother    Depression Mother    Alcohol abuse Mother    Skin cancer Father    Prostate cancer Father 22   Hypertension Father    Diabetes Father    Thyroid disease Maternal Aunt    Thyroid disease Maternal Uncle    Diabetes Brother    Hypertension Brother    Colon cancer Maternal Grandmother        dx 27s, d. 16s   Prostate cancer Paternal Grandfather 82   Breast cancer Cousin        dx 20s-30s    Social History:   Social History   Socioeconomic History    Marital status: Single    Spouse name: Not on file   Number of children: Not on file   Years of education: Not on file   Highest education level: Not on file  Occupational History   Not on file  Tobacco Use   Smoking status: Never   Smokeless tobacco: Never  Vaping Use   Vaping Use: Never used  Substance and Sexual Activity   Alcohol use: Yes    Comment: occ   Drug use: Never   Sexual activity: Not Currently    Birth control/protection: Abstinence  Other Topics Concern   Not on file  Social History Narrative   Not on file   Social Determinants of Health   Financial Resource Strain: Not on file  Food Insecurity: Not on file  Transportation Needs: Not on file  Physical Activity: Not on file  Stress: Not on file  Social Connections: Not on file     Allergies:   Allergies  Allergen Reactions   Morphine And Related Other (See Comments)    Numb hands and feet   Peanut Oil Other (See Comments)    Stomach cramping, hand/foot swelling   Penicillins Hives   Polymyxin B-Trimethoprim Other (See Comments), Photosensitivity and Swelling   Abilify [Aripiprazole] Other (See Comments)    Suicidal thoughts and actions   Lurasidone Hcl     Tardive dyskinesia, akathisia   Citalopram Other (See Comments)    SSRI's ineffective - Citalopram, Fluoxetine, Sertraline, Excitalopram    Food Nausea Only, Swelling, Palpitations, Other (See Comments) and Cough    WHEAT   Prednisone Anxiety, Nausea Only, Other (See Comments) and Palpitations   Singulair [Montelukast Sodium] Other (See Comments)    Mood changes, worsening depression    Metabolic Disorder Labs: Lab Results  Component Value Date   HGBA1C 5.6 04/12/2021   MPG 114 04/12/2021   MPG 119.76 11/08/2020   Lab Results  Component Value Date   PROLACTIN 9.2 11/09/2020   PROLACTIN 10.6 06/18/2019   Lab Results  Component Value Date   CHOL 174 04/12/2021   TRIG 103 04/12/2021   HDL 40 (L) 04/12/2021   CHOLHDL 4.4 04/12/2021    VLDL 18 11/08/2020   LDLCALC 113 (H) 04/12/2021   LDLCALC 137 (H) 11/08/2020   Lab Results  Component Value Date   TSH 2.60 04/12/2021    Therapeutic Level Labs: No results found for: LITHIUM No results found for: CBMZ No results found for: VALPROATE  Current Medications: Current Outpatient Medications  Medication Sig Dispense Refill   Albuterol Sulfate (PROAIR RESPICLICK) [id number] (90 Base) MCG/ACT AEPB Inhale 1-2 puffs into the lungs every 4 (four) hours as needed. 1 each 4   ALPRAZolam (XANAX) 0.5 MG tablet Take 0.5-1 tablets (0.25-0.5 mg total) by mouth 2 (two) times daily as needed for anxiety. 15 tablet 0  amLODipine (NORVASC) 5 MG tablet TAKE 1 TABLET BY MOUTH ONCE DAILY 90 tablet 1   Ascorbic Acid (VITAMIN C) 1000 MG tablet Take 1,000 mg by mouth daily.     atorvastatin (LIPITOR) 40 MG tablet Take 1 tablet (40 mg total) by mouth daily. 90 tablet 3   b complex vitamins tablet Take 1 tablet by mouth daily.     busPIRone (BUSPAR) 15 MG tablet TAKE 1 TABLET BY MOUTH TWICE DAILY 180 tablet 3   CALCIUM PO Take 500 mg by mouth daily.     cetirizine (ZYRTEC) 10 MG tablet Take 10 mg by mouth daily.     Cholecalciferol (VITAMIN D3) 10 MCG (400 UNIT) tablet Take 400 Units by mouth daily.     DULoxetine (CYMBALTA) 30 MG capsule TAKE 1 CAPSULE BY MOUTH DAILY AT 6PM 90 capsule 0   DULoxetine (CYMBALTA) 60 MG capsule TAKE 1 CAPSULE BY MOUTH ONCE DAILY 90 capsule 0   EPINEPHrine 0.3 mg/0.3 mL IJ SOAJ injection Inject 0.3 mLs (0.3 mg total) into the muscle once as needed (anaphylaxis/allergic reaction). 1 each 3   Ferrous Sulfate (IRON PO) Take 325 mg by mouth daily.     fluticasone (FLONASE) 50 MCG/ACT nasal spray Place 1 spray into both nostrils daily. 16 g 5   Ginger, Zingiber officinalis, (GINGER ROOT PO) Take by mouth.     levothyroxine (SYNTHROID) 75 MCG tablet TAKE 1 TABLET BY MOUTH DAILY BEFORE BREAKFAST 90 tablet 0   lisinopril (ZESTRIL) 20 MG tablet TAKE 1 TABLET BY MOUTH ONCE DAILY  90 tablet 1   Magnesium 500 MG CAPS Take 500 mg by mouth daily.     Multiple Vitamin (MULTIVITAMIN) capsule Take 1 capsule by mouth daily.     Omega-3 Fatty Acids (FISH OIL) 1000 MG CAPS Take 1 capsule by mouth 2 (two) times daily.     Probiotic Product (PROBIOTIC ADVANCED PO) Take 1 capsule by mouth daily.     RESTASIS 0.05 % ophthalmic emulsion 1 drop 2 (two) times daily.     No current facility-administered medications for this visit.     Psychiatric Specialty Exam: Review of Systems  Cardiovascular:  Negative for chest pain.  Psychiatric/Behavioral:  Negative for agitation and self-injury.    Last menstrual period 06/18/2017.There is no height or weight on file to calculate BMI.  General Appearance: Casual  Eye Contact:  Fair  Speech:  Clear and Coherent  Volume:  Normal  Mood: Somewhat stressed  Affect:  Congruent  Thought Process:  Goal Directed  Orientation:  Full (Time, Place, and Person)  Thought Content:  Rumination  Suicidal Thoughts:  No  Homicidal Thoughts:  No  Memory:  Immediate;   Fair Recent;   Fair  Judgement:  Fair  Insight:  Fair  Psychomotor Activity:  Normal  Concentration:  Concentration: Fair and Attention Span: Fair  Recall:  [employer] of Knowledge:Good  Language: Good  Akathisia:  No  Handed:    AIMS (if indicated):  No involuntary movements  Assets:  Desire for Improvement  ADL's:  Intact  Cognition: WNL  Sleep:   variable to fair   Screenings: AUDIT    Flowsheet Row Admission (Discharged) from 11/08/2020 in [facility] [facility]  Alcohol Use Disorder Identification Test Final Score (AUDIT) 3      GAD-7    Flowsheet Row Video Visit from 09/11/2020 in [facility] Video Visit from 08/25/2020 in [place]  Office Visit from 08/11/2020 in [place] Office Visit from 11/24/2019 in [place] Video Visit from 09/10/2019 in [place]  Total GAD-7 Score '16 16 16 15 16      '$ PHQ2-9    Flowsheet Row Video Visit from 11/30/2020 in [place] Video Visit from 09/11/2020 in [place] Video Visit from 08/25/2020 in [place] Visit from 08/11/2020 in [place] Visit from 11/24/2019 in [place]  PHQ-2 Total Score '3 4 6 6 4  '$ PHQ-9 Total Score '9 20 23 25 13      '$ Flowsheet Row Video Visit from 05/23/2021 in [place] Video Visit from 03/29/2021 in [place] Video Visit from 03/01/2021 in [place] No Risk No Risk No Risk       Assessment and Plan: as follows Prior documentation reviewed 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

## 2021-06-28 DIAGNOSIS — K3532 Acute appendicitis with perforation and localized peritonitis, without abscess: Secondary | ICD-10-CM | POA: Insufficient documentation

## 2021-07-04 ENCOUNTER — Telehealth (HOSPITAL_COMMUNITY): Payer: No Typology Code available for payment source | Admitting: Psychiatry

## 2021-07-05 ENCOUNTER — Other Ambulatory Visit (HOSPITAL_BASED_OUTPATIENT_CLINIC_OR_DEPARTMENT_OTHER): Payer: Self-pay

## 2021-07-05 MED ORDER — OXYCODONE HCL 5 MG PO TABS
ORAL_TABLET | ORAL | 0 refills | Status: DC
  Filled 2021-07-05: qty 10, 4d supply, fill #0

## 2021-07-05 MED ORDER — METRONIDAZOLE 500 MG PO TABS
ORAL_TABLET | ORAL | 0 refills | Status: DC
  Filled 2021-07-05: qty 21, 7d supply, fill #0

## 2021-07-05 MED ORDER — CEFDINIR 300 MG PO CAPS
ORAL_CAPSULE | ORAL | 0 refills | Status: DC
  Filled 2021-07-05: qty 14, 7d supply, fill #0

## 2021-07-06 ENCOUNTER — Other Ambulatory Visit: Payer: Self-pay | Admitting: *Deleted

## 2021-07-06 ENCOUNTER — Encounter: Payer: Self-pay | Admitting: *Deleted

## 2021-07-06 DIAGNOSIS — Z9049 Acquired absence of other specified parts of digestive tract: Secondary | ICD-10-CM

## 2021-07-06 HISTORY — DX: Acquired absence of other specified parts of digestive tract: Z90.49

## 2021-07-06 NOTE — Patient Outreach (Signed)
Shannon Santiago) Care Management  07/06/2021  Shannon Santiago August 18, 1966 [id number]   Transition of care call/case closure   Referral received:07/05/21 Initial outreach:07/06/21 Insurance: [place] [employer]   Subjective: Initial successful telephone call to patient's preferred number in order to complete transition of care assessment; 2 HIPAA identifiers verified. Explained purpose of call and completed transition of care assessment.  Shannon Santiago states that she is sore but doing well. She denies post-operative problems, says surgical incisions are unremarkable, has dry dressing at one site. states surgical pain well managed with prescribed medications, tolerating diet,soft diet drinking plenty fluids. She denies bowel or bladder problems. She discussed having post op ileus but denies symptoms of nausea, reports passing gas and having bowel movement ,she has stool softener on hand.She reports tolerating mobility in the home, reinforced deep breathing and cough exercise.   Reviewed accessing the following [place] Benefits : She discussed ongoing health issue of Asthma and enrolled in Active health management health coach for disease management . Shannon Santiago discussed completing her all [facility] telephone visits and completing annual physical on 6/27 she report being told that she did not meet all steps for premium discounts. Provided contact number to [employer] and [place] benefits for follow up. She does not have the hospital indemnity plan.  She uses a [facility] outpatient pharmacy [facility]   Objective:  Shannon Santiago  was hospitalized at [facility] , 9/8-9/15/22 for early perforated acute appendicitis, Laparoscopic Appendectomy, post op ileus.  Comorbidities include: Asthma ,Hypertension , mild CAD, anxiety, hyperlipidemia  She  was discharged to home on 07/05/21 without the need for home health services or DME.   Assessment:  Patient voices  good understanding of all discharge instructions.  See transition of care flowsheet for assessment details.   Plan:  Reviewed hospital discharge diagnosis of Laparoscopic Appendectomy  and discharge treatment plan using hospital discharge instructions, assessing medication adherence, reviewing problems requiring provider notification, and discussing the importance of follow up with surgeon, primary care provider and/or specialists as directed.  Reviewed [place] healthy lifestyle program information to receive discounted premium for  2023   Step 1: Get  your annual physical  Step 2: Complete your health assessment  Step 3:Identify your current health status and complete the corresponding action step between October 21, 2020 and June 21, 2021.    Using [place] website, verified that patient is an active participate in [facility]'s Active Health Management chronic disease management program.    No ongoing care management needs identified so will close case to [place] Management services and route successful outreach letter with [place] Management pamphlet and 24 Hour Nurse Line Magnet to [place] Management clinical pool to be mailed to patient's home address.  Thanked patient for their services to [facility].  Joylene Draft, RN, BSN  [place] Management Coordinator  [phone]- Mobile [phone]- Toll Free Main Office

## 2021-07-16 ENCOUNTER — Other Ambulatory Visit: Payer: Self-pay | Admitting: Osteopathic Medicine

## 2021-07-16 ENCOUNTER — Other Ambulatory Visit (HOSPITAL_BASED_OUTPATIENT_CLINIC_OR_DEPARTMENT_OTHER): Payer: Self-pay

## 2021-07-16 DIAGNOSIS — I10 Essential (primary) hypertension: Secondary | ICD-10-CM

## 2021-07-16 DIAGNOSIS — E063 Autoimmune thyroiditis: Secondary | ICD-10-CM

## 2021-07-16 MED ORDER — LEVOTHYROXINE SODIUM 75 MCG PO TABS
ORAL_TABLET | Freq: Every day | ORAL | 0 refills | Status: DC
  Filled 2021-07-16: qty 90, 90d supply, fill #0

## 2021-07-16 MED ORDER — AMLODIPINE BESYLATE 5 MG PO TABS
ORAL_TABLET | Freq: Every day | ORAL | 0 refills | Status: DC
  Filled 2021-07-16: qty 90, 90d supply, fill #0

## 2021-07-16 MED ORDER — LISINOPRIL 20 MG PO TABS
ORAL_TABLET | Freq: Every day | ORAL | 0 refills | Status: DC
  Filled 2021-07-16: qty 90, 90d supply, fill #0

## 2021-07-16 MED FILL — Buspirone HCl Tab 15 MG: ORAL | 90 days supply | Qty: 180 | Fill #1 | Status: AC

## 2021-07-26 ENCOUNTER — Other Ambulatory Visit (HOSPITAL_BASED_OUTPATIENT_CLINIC_OR_DEPARTMENT_OTHER): Payer: Self-pay

## 2021-07-26 MED ORDER — ONDANSETRON 8 MG PO TBDP
ORAL_TABLET | ORAL | 0 refills | Status: DC
  Filled 2021-07-26: qty 20, 7d supply, fill #0

## 2021-07-31 ENCOUNTER — Other Ambulatory Visit (HOSPITAL_BASED_OUTPATIENT_CLINIC_OR_DEPARTMENT_OTHER): Payer: Self-pay

## 2021-08-06 ENCOUNTER — Other Ambulatory Visit: Payer: Self-pay

## 2021-08-06 DIAGNOSIS — R109 Unspecified abdominal pain: Secondary | ICD-10-CM

## 2021-08-06 DIAGNOSIS — Z9049 Acquired absence of other specified parts of digestive tract: Secondary | ICD-10-CM

## 2021-08-17 ENCOUNTER — Other Ambulatory Visit (HOSPITAL_BASED_OUTPATIENT_CLINIC_OR_DEPARTMENT_OTHER): Payer: Self-pay

## 2021-08-17 ENCOUNTER — Encounter: Payer: Self-pay | Admitting: Medical-Surgical

## 2021-08-17 ENCOUNTER — Ambulatory Visit (INDEPENDENT_AMBULATORY_CARE_PROVIDER_SITE_OTHER): Payer: No Typology Code available for payment source | Admitting: Medical-Surgical

## 2021-08-17 ENCOUNTER — Other Ambulatory Visit: Payer: Self-pay

## 2021-08-17 VITALS — BP 130/82 | HR 107 | Resp 20 | Ht 61.0 in | Wt 153.4 lb

## 2021-08-17 DIAGNOSIS — Z7689 Persons encountering health services in other specified circumstances: Secondary | ICD-10-CM | POA: Diagnosis not present

## 2021-08-17 DIAGNOSIS — F324 Major depressive disorder, single episode, in partial remission: Secondary | ICD-10-CM | POA: Diagnosis not present

## 2021-08-17 DIAGNOSIS — Z09 Encounter for follow-up examination after completed treatment for conditions other than malignant neoplasm: Secondary | ICD-10-CM

## 2021-08-17 MED ORDER — DULOXETINE HCL 60 MG PO CPEP
ORAL_CAPSULE | Freq: Every day | ORAL | 0 refills | Status: DC
  Filled 2021-08-17: qty 90, 90d supply, fill #0

## 2021-08-17 NOTE — Progress Notes (Signed)
  HPI with pertinent ROS:   CC: transfer of care, surgery follow up  HPI: Pleasant 55 year old female presenting today to transfer care to a new PCP and to complete a surgery follow-up.  In early September, she had a hospital admission for ruptured appendicitis with appendectomy.  Unfortunately she had complications including an ileus which prolonged her recovery.  Today she reports she is doing very well overall and feeling much better.  She did have IBS in her younger years and had not had issues with it for a while.  Unfortunately, her surgery has aggravated her symptoms.  She often has bloating and swelling in the abdominal area, worse with eating certain foods like breads and grains.  She started drinking aloe vera juice daily about 2 to 3 weeks ago and notes this has made a difference in her symptoms.  She does report getting COVID at the beginning of this month and recovered fairly well but does still have some intermittent wheezing and chest congestion.  She is using Mucinex for this with occasional relief.  She does not use her albuterol inhaler but does have 1 available.  Reports needing a refill on her Cymbalta 60 mg today.  She is followed by psychiatry.  I reviewed the past medical history, family history, social history, surgical history, and allergies today and no changes were needed.  Please see the problem list section below in epic for further details.  Physical exam:   General: Well Developed, well nourished, and in no acute distress.  Neuro: Alert and oriented x3.  HEENT: Normocephalic, atraumatic.  Skin: Warm and dry. Cardiac: Regular rate and rhythm, no murmurs rubs or gallops, no lower extremity edema.  Respiratory: Clear to auscultation bilaterally. Not using accessory muscles, speaking in full sentences.  Impression and Recommendations:    1. Encounter to establish care Reviewed available information and discussed care concerns with patient.   2. Surgery  follow-up She is doing very well after her surgery and recovering slowly but surely.  She has no current needs aside from a refill of her Cymbalta and feels that her symptoms are fairly well controlled.  3.  Depression Refilling Cymbalta.  This will remain under psychiatry management but do not want her to run out of a long-term medication.  Return in about 6 months (around 02/15/2022) for chronic disease follow up. ___________________________________________ Clearnce Sorrel, DNP, APRN, FNP-BC [facility] and [place]

## 2021-08-23 ENCOUNTER — Ambulatory Visit: Payer: No Typology Code available for payment source | Admitting: Gastroenterology

## 2021-09-19 ENCOUNTER — Other Ambulatory Visit: Payer: Self-pay

## 2021-09-19 ENCOUNTER — Ambulatory Visit (INDEPENDENT_AMBULATORY_CARE_PROVIDER_SITE_OTHER): Payer: No Typology Code available for payment source | Admitting: Gastroenterology

## 2021-09-19 ENCOUNTER — Other Ambulatory Visit (HOSPITAL_BASED_OUTPATIENT_CLINIC_OR_DEPARTMENT_OTHER): Payer: Self-pay

## 2021-09-19 ENCOUNTER — Other Ambulatory Visit (INDEPENDENT_AMBULATORY_CARE_PROVIDER_SITE_OTHER): Payer: No Typology Code available for payment source

## 2021-09-19 ENCOUNTER — Encounter: Payer: Self-pay | Admitting: Gastroenterology

## 2021-09-19 VITALS — BP 142/92 | HR 62 | Ht 61.0 in | Wt 159.1 lb

## 2021-09-19 DIAGNOSIS — R1032 Left lower quadrant pain: Secondary | ICD-10-CM | POA: Diagnosis not present

## 2021-09-19 DIAGNOSIS — K581 Irritable bowel syndrome with constipation: Secondary | ICD-10-CM

## 2021-09-19 DIAGNOSIS — R1031 Right lower quadrant pain: Secondary | ICD-10-CM

## 2021-09-19 LAB — COMPREHENSIVE METABOLIC PANEL
ALT: 25 U/L (ref 0–35)
AST: 18 U/L (ref 0–37)
Albumin: 4.4 g/dL (ref 3.5–5.2)
Alkaline Phosphatase: 78 U/L (ref 39–117)
BUN: 18 mg/dL (ref 6–23)
CO2: 28 mEq/L (ref 19–32)
Calcium: 9.5 mg/dL (ref 8.4–10.5)
Chloride: 100 mEq/L (ref 96–112)
Creatinine, Ser: 0.78 mg/dL (ref 0.40–1.20)
GFR: 85.37 mL/min (ref >60.00)
Glucose, Bld: 98 mg/dL (ref 70–99)
Potassium: 4.5 mEq/L (ref 3.5–5.1)
Sodium: 136 mEq/L (ref 135–145)
Total Bilirubin: 0.5 mg/dL (ref 0.2–1.2)
Total Protein: 7.4 g/dL (ref 6.0–8.3)

## 2021-09-19 LAB — C-REACTIVE PROTEIN: CRP: <1 mg/dL (ref 0.5–20.0)

## 2021-09-19 LAB — CBC WITH DIFFERENTIAL/PLATELET
Basophils Absolute: 0 10*3/uL (ref 0.0–0.1)
Basophils Relative: 0.7 % (ref 0.0–3.0)
Eosinophils Absolute: 0.2 10*3/uL (ref 0.0–0.7)
Eosinophils Relative: 3.5 % (ref 0.0–5.0)
HCT: 39.9 % (ref 36.0–46.0)
Hemoglobin: 13.1 g/dL (ref 12.0–15.0)
Lymphocytes Relative: 28.8 % (ref 12.0–46.0)
Lymphs Abs: 2 10*3/uL (ref 0.7–4.0)
MCHC: 32.7 g/dL (ref 30.0–36.0)
MCV: 94.7 fl (ref 78.0–100.0)
Monocytes Absolute: 0.6 10*3/uL (ref 0.1–1.0)
Monocytes Relative: 8.5 % (ref 3.0–12.0)
Neutro Abs: 4 10*3/uL (ref 1.4–7.7)
Neutrophils Relative %: 58.5 % (ref 43.0–77.0)
Platelets: 428 10*3/uL — ABNORMAL HIGH (ref 150.0–400.0)
RBC: 4.22 Mil/uL (ref 3.87–5.11)
RDW: 15.2 % (ref 11.5–15.5)
WBC: 6.9 10*3/uL (ref 4.0–10.5)

## 2021-09-19 LAB — LIPASE: Lipase: 27 U/L (ref 11.0–59.0)

## 2021-09-19 NOTE — Patient Instructions (Addendum)
If you are age 55 or older, your body mass index should be between 23-30. Your Body mass index is 30.07 kg/m. If this is out of the aforementioned range listed, please consider follow up with your Primary Care Provider.  If you are age 70 or younger, your body mass index should be between 19-25. Your Body mass index is 30.07 kg/m. If this is out of the aformentioned range listed, please consider follow up with your Primary Care Provider.   ________________________________________________________  The [facility] providers would like to encourage you to use [facility] to communicate with providers for non-urgent requests or questions.  Due to long hold times on the telephone, sending your provider a message by [facility] may be a faster and more efficient way to get a response.  Please allow 48 business hours for a response.  Please remember that this is for non-urgent requests.  _______________________________________________________  Please go to the lab on the 2nd floor [address] before you leave the office today.   Recall Colon set for 10-2026. We will send a letter as it gets closer but you can call 2 months prior to see about scheduling this procedure.   Call in 3 days to schedule this CT You have been scheduled for a CT scan of the abdomen and pelvis [facility] [place] [id number] [address]).   You are scheduled on          at            . You should arrive 15 minutes prior to your appointment time for registration. Please follow the written instructions below on the day of your exam:  WARNING: IF YOU ARE ALLERGIC TO IODINE/X-RAY DYE, PLEASE NOTIFY RADIOLOGY IMMEDIATELY AT [phone]! YOU WILL BE GIVEN A 13 HOUR PREMEDICATION PREP.  1) Do not eat or drink anything after            (4 hours prior to your test) 2) You have been given 2 bottles of oral contrast to drink. The solution may taste better if refrigerated, but do NOT add ice or any  other liquid to this solution. Shake well before drinking.    Drink 1 bottle of contrast @          (2 hours prior to your exam)  Drink 1 bottle of contrast @          (1 hour prior to your exam)  You may take any medications as prescribed with a small amount of water, if necessary. If you take any of the following medications: METFORMIN, GLUCOPHAGE, GLUCOVANCE, AVANDAMET, RIOMET, FORTAMET, [place] MET, JANUMET, GLUMETZA or METAGLIP, you MAY be asked to HOLD this medication 48 hours AFTER the exam.  The purpose of you drinking the oral contrast is to aid in the visualization of your intestinal tract. The contrast solution may cause some diarrhea. Depending on your individual set of symptoms, you may also receive an intravenous injection of x-ray contrast/dye. Plan on being at [facility] for 30 minutes or longer, depending on the type of exam you are having performed.  This test typically takes 30-45 minutes to complete.  If you have any questions regarding your exam or if you need to reschedule, you may call the CT department at [phone] between the hours of 8:00 am and 5:00 pm, Monday-Friday.  ________________________________________________________________________  Thank you,  Dr. Jackquline Denmark

## 2021-09-19 NOTE — Progress Notes (Signed)
Chief Complaint: To establish care  Referring Provider:  Emeterio Reeve, DO      ASSESSMENT AND PLAN;   #1. RLQ/LLQ pain.  Recent lap appendicectomy 06/28/2021 d/t ruptured appendix. R/O any abscesses.  Neg colon Jan 2018 (rpt in 10 years)  #2. IBS-C  Plan: -Check CBC, CMP, CRP, lipase, celiac serology. -CT Abdo/pelvis with p.o. and IV contrast -Colonoscopy Jan 2028.  Earlier, if with any new problems or change in family history. -FU therafter  HPI:    Shannon Santiago is a 55 y.o. female  With HTN, mild CAD, asthma, Hashimoto's thyroiditis, DJD, right plantar fasciitis, anxiety/depression S/P lap appendicectomy 06/28/2021 for ruptured appendix, seen for follow-up visit by Dr. Kela Millin (general surgery) was advised CT.  Since patient is [employer] employee, she would prefer to get it done here.   Longstanding H/O IBS-C.  Mostly with constipation with pellet-like stools, abdominal bloating and lower abdominal discomfort which gets better with defecation.  She occasionally would have diarrhea.  She does not like to take medications for IBS.  She is better with probiotics alovera and ginger. Has more problems with gluten.  No history suggestive of lactose intolerance.  Denies having any upper GI symptoms including heartburn, regurgitation, odynophagia or dysphagia.  She denies having any nausea or vomiting.  No fever chills or night sweats.  No recent weight loss.  No family history of colon cancer in a first-degree relative. GM had colon ca. No FH of polyps.  Past GI procedures: Screening colonoscopy [facility]) 11/08/2016 normal to TI.  Repeat in 10 years. EGD: HH early 51s Past Medical History:  Diagnosis Date   Allergy    Anxiety    Asthma    Depression    Family history of breast cancer    Family history of colon cancer    Family history of lung cancer    Family history of prostate cancer    Hyperlipidemia    Hypertension    Hypothyroidism    Prolonged  PTT (partial thromboplastin time) 06/23/2019   Normal PT   Thyroid disease     Past Surgical History:  Procedure Laterality Date   APPENDECTOMY     ENDOMETRIAL BIOPSY  06/21/2019   EYE SURGERY     x 5   HYSTEROSCOPY WITH D & C N/A 10/27/2019   Procedure: DILATATION AND CURETTAGE /HYSTEROSCOPY;  Surgeon: Emily Filbert, MD;  Location: [place];  Service: Gynecology;  Laterality: N/A;    Family History  Problem Relation Age of Onset   Hypertension Mother    Stroke Mother    Lung cancer Mother 68   Anxiety disorder Mother    Depression Mother    Alcohol abuse Mother    Skin cancer Father    Prostate cancer Father 32   Hypertension Father    Diabetes Father    Thyroid disease Maternal Aunt    Thyroid disease Maternal Uncle    Diabetes Brother    Hypertension Brother    Colon cancer Maternal Grandmother        dx 61s, d. 18s   Prostate cancer Paternal Grandfather 44   Breast cancer Cousin        dx 20s-30s    Social History   Tobacco Use   Smoking status: Never   Smokeless tobacco: Never  Vaping Use   Vaping Use: Never used  Substance Use Topics   Alcohol use: Yes    Comment: occ   Drug use: Never  Current Outpatient Medications  Medication Sig Dispense Refill   Albuterol Sulfate (PROAIR RESPICLICK) 027 (90 Base) MCG/ACT AEPB Inhale 1-2 puffs into the lungs every 4 (four) hours as needed. 1 each 4   ALPRAZolam (XANAX) 0.5 MG tablet Take 0.5-1 tablets (0.25-0.5 mg total) by mouth 2 (two) times daily as needed for anxiety. 15 tablet 0   amLODipine (NORVASC) 5 MG tablet TAKE 1 TABLET BY MOUTH ONCE DAILY 90 tablet 0   Ascorbic Acid (VITAMIN C) 1000 MG tablet Take 1,000 mg by mouth daily.     b complex vitamins tablet Take 1 tablet by mouth daily.     busPIRone (BUSPAR) 15 MG tablet TAKE 1 TABLET BY MOUTH TWICE DAILY 180 tablet 3   CALCIUM PO Take 500 mg by mouth daily.     cetirizine (ZYRTEC) 10 MG tablet Take 10 mg by mouth daily.      Cholecalciferol (VITAMIN D3) 10 MCG (400 UNIT) tablet Take 400 Units by mouth daily.     DULoxetine (CYMBALTA) 30 MG capsule TAKE 1 CAPSULE BY MOUTH DAILY AT 6PM 90 capsule 0   DULoxetine (CYMBALTA) 60 MG capsule TAKE 1 CAPSULE BY MOUTH ONCE DAILY 90 capsule 0   EPINEPHrine 0.3 mg/0.3 mL IJ SOAJ injection Inject 0.3 mLs (0.3 mg total) into the muscle once as needed (anaphylaxis/allergic reaction). 1 each 3   Ferrous Sulfate (IRON PO) Take 325 mg by mouth daily.     fluticasone (FLONASE) 50 MCG/ACT nasal spray Place 1 spray into both nostrils daily. 16 g 5   Ginger, Zingiber officinalis, (GINGER ROOT PO) Take by mouth.     levothyroxine (SYNTHROID) 75 MCG tablet TAKE 1 TABLET BY MOUTH DAILY BEFORE BREAKFAST 90 tablet 0   lisinopril (ZESTRIL) 20 MG tablet TAKE 1 TABLET BY MOUTH ONCE DAILY 90 tablet 0   Magnesium 500 MG CAPS Take 500 mg by mouth daily.     Multiple Vitamin (MULTIVITAMIN) capsule Take 1 capsule by mouth daily.     Omega-3 Fatty Acids (FISH OIL) 1000 MG CAPS Take 1 capsule by mouth 2 (two) times daily.     Probiotic Product (PROBIOTIC ADVANCED PO) Take 1 capsule by mouth daily.     atorvastatin (LIPITOR) 40 MG tablet Take 1 tablet (40 mg total) by mouth daily. 90 tablet 3   RESTASIS 0.05 % ophthalmic emulsion 1 drop 2 (two) times daily. (Patient not taking: Reported on 09/19/2021)     No current facility-administered medications for this visit.    Allergies  Allergen Reactions   Morphine And Related Other (See Comments)    Numb hands and feet   Peanut Oil Other (See Comments)    Stomach cramping, hand/foot swelling   Penicillins Hives   Polymyxin B-Trimethoprim Other (See Comments), Photosensitivity and Swelling   Abilify [Aripiprazole] Other (See Comments)    Suicidal thoughts and actions   Lurasidone Hcl     Tardive dyskinesia, akathisia   Citalopram Other (See Comments)    SSRI's ineffective - Citalopram, Fluoxetine, Sertraline, Excitalopram    Food Nausea Only,  Swelling, Palpitations, Other (See Comments) and Cough    WHEAT   Prednisone Anxiety, Nausea Only, Other (See Comments) and Palpitations   Singulair [Montelukast Sodium] Other (See Comments)    Mood changes, worsening depression    Review of Systems:  Constitutional: Denies fever, chills, diaphoresis, appetite change and has fatigue.  HEENT: Has allergies Respiratory: Denies SOB, DOE, cough, chest tightness,  and wheezing.   Cardiovascular: Denies chest pain, palpitations and leg  swelling.  Genitourinary: Denies dysuria, urgency, frequency, hematuria, flank pain and difficulty urinating.  Musculoskeletal: Denies myalgias, Has back pain, joint swelling, arthralgias and gait problem.  Skin: No rash.  Neurological: Denies dizziness, seizures, syncope, weakness, light-headedness, numbness and has headaches.  Hematological: Denies adenopathy. Easy bruising, personal or family bleeding history  Psychiatric/Behavioral: has anxiety or depression. Has sleeping problems.     Physical Exam:    BP (!) 142/92 (BP Location: Left Arm, Patient Position: Sitting, Cuff Size: Normal)   Pulse 62   Ht 5\' 1"  (1.549 m)   Wt 159 lb 2 oz (72.2 kg)   LMP 06/18/2017   BMI 30.07 kg/m  Wt Readings from Last 3 Encounters:  09/19/21 159 lb 2 oz (72.2 kg)  08/17/21 153 lb 6.4 oz (69.6 kg)  04/16/21 170 lb (77.1 kg)   Constitutional:  Well-developed, in no acute distress. Psychiatric: Normal mood and affect. Behavior is normal. HEENT: Pupils normal.  Conjunctivae are normal. No scleral icterus. Neck supple.  Cardiovascular: Normal rate, regular rhythm. No edema Pulmonary/chest: Effort normal and breath sounds normal. No wheezing, rales or rhonchi. Abdominal: Soft, nondistended. Mild LLQ/RLQ abdominal tenderness.  No rebound. Bowel sounds active throughout. There are no masses palpable. No hepatomegaly. Rectal: Deferred Neurological: Alert and oriented to person place and time. Skin: Skin is warm and dry.  No rashes noted.  Data Reviewed: I have personally reviewed following labs and imaging studies  CBC: CBC Latest Ref Rng & Units 04/12/2021 11/09/2020 11/07/2020  WBC 3.8 - 10.8 Thousand/uL 7.1 9.2 11.8(H)  Hemoglobin 11.7 - 15.5 g/dL 14.2 15.1(H) 13.9  Hematocrit 35.0 - 45.0 % 42.2 45.8 42.1  Platelets 140 - 400 Thousand/uL 403(H) 434(H) 400    CMP: CMP Latest Ref Rng & Units 04/12/2021 11/07/2020 08/11/2020  Glucose 65 - 99 mg/dL 105(H) 130(H) 109(H)  BUN 7 - 25 mg/dL 18 23(H) 16  Creatinine 0.50 - 1.05 mg/dL 1.03 0.93 0.88  Sodium 135 - 146 mmol/L 140 138 138  Potassium 3.5 - 5.3 mmol/L 4.9 4.0 4.5  Chloride 98 - 110 mmol/L 102 102 105  CO2 20 - 32 mmol/L 26 25 25   Calcium 8.6 - 10.4 mg/dL 9.9 9.0 10.0  Total Protein 6.1 - 8.1 g/dL 7.1 7.3 7.1  Total Bilirubin 0.2 - 1.2 mg/dL 0.4 0.3 0.5  Alkaline Phos 38 - 126 U/L - 78 -  AST 10 - 35 U/L 12 22 24   ALT 6 - 29 U/L 15 35 39(H)      Carmell Austria, MD 09/19/2021, 10:30 AM  Cc: Emeterio Reeve, DO

## 2021-09-20 ENCOUNTER — Telehealth (HOSPITAL_BASED_OUTPATIENT_CLINIC_OR_DEPARTMENT_OTHER): Payer: Self-pay

## 2021-09-22 LAB — CELIAC PANEL 10
Antigliadin Abs, IgA: 5 units (ref 0–19)
Endomysial IgA: NEGATIVE
Gliadin IgG: 2 units (ref 0–19)
IgA/Immunoglobulin A, Serum: 273 mg/dL (ref 87–352)
Tissue Transglut Ab: <2 U/mL (ref 0–5)
Transglutaminase IgA: <2 U/mL (ref 0–3)

## 2021-09-25 ENCOUNTER — Emergency Department (HOSPITAL_BASED_OUTPATIENT_CLINIC_OR_DEPARTMENT_OTHER)
Admission: EM | Admit: 2021-09-25 | Discharge: 2021-09-25 | Disposition: A | Discharge status: Home / Self Care | Payer: No Typology Code available for payment source | Attending: Emergency Medicine | Admitting: Emergency Medicine

## 2021-09-25 ENCOUNTER — Encounter (HOSPITAL_BASED_OUTPATIENT_CLINIC_OR_DEPARTMENT_OTHER): Payer: Self-pay

## 2021-09-25 ENCOUNTER — Other Ambulatory Visit: Payer: Self-pay

## 2021-09-25 ENCOUNTER — Emergency Department (HOSPITAL_BASED_OUTPATIENT_CLINIC_OR_DEPARTMENT_OTHER)
Admission: RE | Admit: 2021-09-25 | Discharge: 2021-09-25 | Disposition: A | Discharge status: Home / Self Care | Payer: No Typology Code available for payment source | Source: Ambulatory Visit | Attending: Gastroenterology | Admitting: Gastroenterology

## 2021-09-25 DIAGNOSIS — I2510 Atherosclerotic heart disease of native coronary artery without angina pectoris: Secondary | ICD-10-CM | POA: Diagnosis not present

## 2021-09-25 DIAGNOSIS — T80818A Extravasation of other vesicant agent, initial encounter: Secondary | ICD-10-CM | POA: Diagnosis not present

## 2021-09-25 DIAGNOSIS — E039 Hypothyroidism, unspecified: Secondary | ICD-10-CM | POA: Insufficient documentation

## 2021-09-25 DIAGNOSIS — Z91010 Allergy to peanuts: Secondary | ICD-10-CM | POA: Insufficient documentation

## 2021-09-25 DIAGNOSIS — K581 Irritable bowel syndrome with constipation: Secondary | ICD-10-CM | POA: Insufficient documentation

## 2021-09-25 DIAGNOSIS — I119 Hypertensive heart disease without heart failure: Secondary | ICD-10-CM | POA: Insufficient documentation

## 2021-09-25 DIAGNOSIS — J4520 Mild intermittent asthma, uncomplicated: Secondary | ICD-10-CM | POA: Diagnosis not present

## 2021-09-25 DIAGNOSIS — R1032 Left lower quadrant pain: Secondary | ICD-10-CM

## 2021-09-25 DIAGNOSIS — R1031 Right lower quadrant pain: Secondary | ICD-10-CM | POA: Diagnosis not present

## 2021-09-25 DIAGNOSIS — S4991XA Unspecified injury of right shoulder and upper arm, initial encounter: Secondary | ICD-10-CM | POA: Diagnosis present

## 2021-09-25 DIAGNOSIS — Z79899 Other long term (current) drug therapy: Secondary | ICD-10-CM | POA: Diagnosis not present

## 2021-09-25 DIAGNOSIS — M7989 Other specified soft tissue disorders: Secondary | ICD-10-CM

## 2021-09-25 DIAGNOSIS — X58XXXA Exposure to other specified factors, initial encounter: Secondary | ICD-10-CM | POA: Insufficient documentation

## 2021-09-25 LAB — CBC WITH DIFFERENTIAL/PLATELET
Abs Immature Granulocytes: 0.03 10*3/uL (ref 0.00–0.07)
Basophils Absolute: 0.1 10*3/uL (ref 0.0–0.1)
Basophils Relative: 1 %
Eosinophils Absolute: 0.3 10*3/uL (ref 0.0–0.5)
Eosinophils Relative: 3 %
HCT: 40.9 % (ref 36.0–46.0)
Hemoglobin: 14 g/dL (ref 12.0–15.0)
Immature Granulocytes: 0 %
Lymphocytes Relative: 22 %
Lymphs Abs: 2 10*3/uL (ref 0.7–4.0)
MCH: 31.8 pg (ref 26.0–34.0)
MCHC: 34.2 g/dL (ref 30.0–36.0)
MCV: 93 fL (ref 80.0–100.0)
Monocytes Absolute: 0.7 10*3/uL (ref 0.1–1.0)
Monocytes Relative: 8 %
Neutro Abs: 6.2 10*3/uL (ref 1.7–7.7)
Neutrophils Relative %: 66 %
Platelets: 420 10*3/uL — ABNORMAL HIGH (ref 150–400)
RBC: 4.4 MIL/uL (ref 3.87–5.11)
RDW: 15 % (ref 11.5–15.5)
WBC: 9.3 10*3/uL (ref 4.0–10.5)
nRBC: 0 % (ref 0.0–0.2)

## 2021-09-25 LAB — BASIC METABOLIC PANEL
Anion gap: 9 (ref 5–15)
BUN: 19 mg/dL (ref 6–20)
CO2: 25 mmol/L (ref 22–32)
Calcium: 9.2 mg/dL (ref 8.9–10.3)
Chloride: 101 mmol/L (ref 98–111)
Creatinine, Ser: 0.83 mg/dL (ref 0.44–1.00)
GFR, Estimated: >60 mL/min (ref >60)
Glucose, Bld: 105 mg/dL — ABNORMAL HIGH (ref 70–99)
Potassium: 3.9 mmol/L (ref 3.5–5.1)
Sodium: 135 mmol/L (ref 135–145)

## 2021-09-25 LAB — CK: Total CK: 66 U/L (ref 38–234)

## 2021-09-25 MED ORDER — IOHEXOL 300 MG/ML  SOLN
100.0000 mL | Freq: Once | INTRAMUSCULAR | Status: AC | PRN
  Administered 2021-09-25: 75 mL via INTRAVENOUS

## 2021-09-25 MED ORDER — IOHEXOL 300 MG/ML  SOLN
100.0000 mL | Freq: Once | INTRAMUSCULAR | Status: AC | PRN
  Administered 2021-09-25: 100 mL via INTRAVENOUS

## 2021-09-25 NOTE — ED Provider Notes (Signed)
[facility] Provider Note   CSN: [id number] Arrival date & time: 09/25/21  1233     History Chief Complaint  Patient presents with   Arm Swelling    Shannon Santiago is a 55 y.o. female.  She is here with a complaint of increased swelling of her right arm.  She was getting an outpatient CT earlier today when she had about 100 cc of contrast extravasated into her right antecubital area.  I have evaluated her when this happened at the request of the CAT scan technician.  She had some localized swelling to her proximal forearm.  They elevated her and watched her for a little bit and discharged her.  She said since then she has noticed swelling down into her hand and up in her bicep.  Its become a little achy.  No numbness or weakness.  No other systemic symptoms.  The history is provided by the patient.  Arm Injury Location:  Arm Arm location:  R arm Injury: yes   Time since incident:  4 hours Mechanism of injury comment:  IV contrast extravasation Pain details:    Quality:  Aching   Severity:  Moderate   Onset quality:  Gradual   Duration:  4 hours   Timing:  Constant   Progression:  Worsening Prior injury to area:  No Relieved by:  None tried Worsened by:  Movement Ineffective treatments:  Rest and heat Associated symptoms: swelling   Associated symptoms: no fever, no neck pain, no numbness and no tingling       Past Medical History:  Diagnosis Date   Allergy    Anxiety    Asthma    Depression    Family history of breast cancer    Family history of colon cancer    Family history of lung cancer    Family history of prostate cancer    Hyperlipidemia    Hypertension    Hypothyroidism    Prolonged PTT (partial thromboplastin time) 06/23/2019   Normal PT   Thyroid disease     Patient Active Problem List   Diagnosis Date Noted   Status post laparoscopic appendectomy 07/06/2021   Mild CAD 03/05/2021   Obesity (BMI 30-39.9) 03/05/2021    Prediabetes 03/05/2021   Thyroid disease    Hypothyroidism    Hyperlipidemia    Hypertension    Depression    Asthma    Anxiety    Allergy    MDD (major depressive disorder), recurrent, severe, 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   Genetic testing 09/15/2019   Premature atrial complexes 09/10/2019   Family history of breast cancer    Family history of lung cancer    Family history of colon cancer    Family history of prostate cancer    Prolonged PTT (partial thromboplastin time) 06/23/2019   Plantar fasciitis, right 06/03/2019   Fasting hyperglycemia 05/03/2019   Hashimoto's thyroiditis 11/30/2018   Postmenopausal 11/30/2018   Moderate episode of recurrent major depressive disorder ([place]) 11/30/2018   Statin declined 11/30/2018   Past heart attack 06/28/2015   Mixed hyperlipidemia 05/04/2015   Moderate persistent asthma without complication 28/31/5176   Major depressive disorder, recurrent, severe without psychotic features ([place]) 06/10/2014   DDD (degenerative disc disease), lumbosacral 12/10/2013   Chronic headaches 11/24/2013   Hypertension goal BP (blood pressure) < 130/80 11/24/2013    Past Surgical History:  Procedure Laterality Date   APPENDECTOMY     ENDOMETRIAL BIOPSY  06/21/2019   EYE SURGERY  x 5   HYSTEROSCOPY WITH D & C N/A 10/27/2019   Procedure: DILATATION AND CURETTAGE /HYSTEROSCOPY;  Surgeon: Emily Filbert, MD;  Location: [place];  Service: Gynecology;  Laterality: N/A;     OB History     Gravida  0   Para  0   Term  0   Preterm  0   AB  0   Living  0      SAB  0   IAB  0   Ectopic  0   Multiple  0   Live Births  0           Family History  Problem Relation Age of Onset   Hypertension Mother    Stroke Mother    Lung cancer Mother 26   Anxiety disorder Mother    Depression Mother    Alcohol abuse Mother    Skin cancer Father    Prostate cancer Father 79   Hypertension Father    Diabetes Father     Thyroid disease Maternal Aunt    Thyroid disease Maternal Uncle    Diabetes Brother    Hypertension Brother    Colon cancer Maternal Grandmother        dx 96s, d. 37s   Prostate cancer Paternal Grandfather 63   Breast cancer Cousin        dx 20s-30s    Social History   Tobacco Use   Smoking status: Never   Smokeless tobacco: Never  Vaping Use   Vaping Use: Never used  Substance Use Topics   Alcohol use: Yes    Comment: occ   Drug use: Never    Home Medications Prior to Admission medications   Medication Sig Start Date End Date Taking? Authorizing Provider  Albuterol Sulfate (PROAIR RESPICLICK) 124 (90 Base) MCG/ACT AEPB Inhale 1-2 puffs into the lungs every 4 (four) hours as needed. 04/12/20   Emeterio Reeve, DO  ALPRAZolam Duanne Moron) 0.5 MG tablet Take 0.5-1 tablets (0.25-0.5 mg total) by mouth 2 (two) times daily as needed for anxiety. 11/24/19   Emeterio Reeve, DO  amLODipine (NORVASC) 5 MG tablet TAKE 1 TABLET BY MOUTH ONCE DAILY 07/16/21 07/16/22  Silverio Decamp, MD  Ascorbic Acid (VITAMIN C) 1000 MG tablet Take 1,000 mg by mouth daily.    [provider]  atorvastatin (LIPITOR) 40 MG tablet Take 1 tablet (40 mg total) by mouth daily. 03/05/21 12/18/21  Tobb, Godfrey Pick, DO  b complex vitamins tablet Take 1 tablet by mouth daily.    [provider]  busPIRone (BUSPAR) 15 MG tablet TAKE 1 TABLET BY MOUTH TWICE DAILY 01/01/21 01/01/22  Emeterio Reeve, DO  CALCIUM PO Take 500 mg by mouth daily.    [provider]  cetirizine (ZYRTEC) 10 MG tablet Take 10 mg by mouth daily. 07/05/20   [provider]  Cholecalciferol (VITAMIN D3) 10 MCG (400 UNIT) tablet Take 400 Units by mouth daily.    [provider]  DULoxetine (CYMBALTA) 30 MG capsule TAKE 1 CAPSULE BY MOUTH DAILY AT [facility] 05/23/21 05/23/22  [name], MD  DULoxetine (CYMBALTA) 60 MG capsule TAKE 1 CAPSULE BY MOUTH ONCE DAILY 08/17/21 08/17/22  Samuel Bouche, NP  EPINEPHrine  0.3 mg/0.3 mL IJ SOAJ injection Inject 0.3 mLs (0.3 mg total) into the muscle once as needed (anaphylaxis/allergic reaction). 05/06/19   Trixie Dredge, PA-C  Ferrous Sulfate (IRON PO) Take 325 mg by mouth daily.    [provider]  fluticasone (FLONASE) 50 MCG/ACT nasal spray Place 1 spray into both nostrils daily. 10/19/20   Martyn Ehrich, NP  Ginger, Zingiber officinalis, (GINGER ROOT PO) Take by mouth.    [provider]  levothyroxine (SYNTHROID) 75 MCG tablet TAKE 1 TABLET BY MOUTH DAILY BEFORE BREAKFAST 07/16/21 07/16/22  Silverio Decamp, MD  lisinopril (ZESTRIL) 20 MG tablet TAKE 1 TABLET BY MOUTH ONCE DAILY 07/16/21 07/16/22  Silverio Decamp, MD  Magnesium 500 MG CAPS Take 500 mg by mouth daily.    [provider]  Multiple Vitamin (MULTIVITAMIN) capsule Take 1 capsule by mouth daily.    [provider]  Omega-3 Fatty Acids (FISH OIL) 1000 MG CAPS Take 1 capsule by mouth 2 (two) times daily.    [provider]  Probiotic Product (PROBIOTIC ADVANCED PO) Take 1 capsule by mouth daily.    [provider]  RESTASIS 0.05 % ophthalmic emulsion 1 drop 2 (two) times daily. Patient not taking: Reported on 09/19/2021 06/08/20   [provider]    Allergies    Morphine and related, Peanut oil, Penicillins, Polymyxin b-trimethoprim, Abilify [aripiprazole], Lurasidone hcl, Citalopram, Food, Prednisone, and Singulair [montelukast sodium]  Review of Systems   Review of Systems  Constitutional:  Negative for fever.  HENT:  Negative for sore throat.   Eyes:  Negative for visual disturbance.  Respiratory:  Negative for shortness of breath.   Cardiovascular:  Negative for chest pain.  Gastrointestinal:  Negative for abdominal pain.  Genitourinary:  Negative for dysuria.  Musculoskeletal:  Negative for neck pain.  Skin:  Negative for rash.  Neurological:  Negative for headaches.   Physical Exam Updated Vital  Signs BP (!) 140/97 (BP Location: Left Arm)   Pulse 96   Temp 98.4 F (36.9 C) (Oral)   Resp 18   Ht 5\' 1"  (1.549 m)   Wt 73 kg   LMP 06/18/2017   SpO2 100%   BMI 30.42 kg/m   Physical Exam Vitals and nursing note reviewed.  Constitutional:      General: She is not in acute distress.    Appearance: Normal appearance. She is well-developed.  HENT:     Head: Normocephalic and atraumatic.  Eyes:     Conjunctiva/sclera: Conjunctivae normal.  Cardiovascular:     Rate and Rhythm: Normal rate and regular rhythm.     Heart sounds: No murmur heard. Pulmonary:     Effort: Pulmonary effort is normal. No respiratory distress.     Breath sounds: Normal breath sounds.  Abdominal:     Palpations: Abdomen is soft.     Tenderness: There is no abdominal tenderness.  Musculoskeletal:        General: Swelling and tenderness present. Normal range of motion.     Cervical back: Neck supple.     Comments: She has some fullness of her right forearm and some swelling on the dorsum of her hand.  Radial pulse 2+.  There are some mild upper arm swelling.  A little bit of bruising starting in her hand.  Skin:    General: Skin is warm and dry.     Capillary Refill: Capillary refill takes less than 2 seconds.  Neurological:     General: No focal deficit present.     Mental Status: She is alert.  Psychiatric:        Mood and Affect: Mood normal.    ED Results / Procedures / Treatments   Labs (all labs ordered are listed, but only  abnormal results are displayed) Labs Reviewed  BASIC METABOLIC PANEL - Abnormal; Notable for the following components:      Result Value   Glucose, Bld 105 (*)    All other components within normal limits  CBC WITH DIFFERENTIAL/PLATELET - Abnormal; Notable for the following components:   Platelets 420 (*)    All other components within normal limits  CK    EKG None  Radiology No results found.  Procedures Procedures   Medications Ordered in ED Medications  - No data to display  ED Course  I have reviewed the triage vital signs and the nursing notes.  Pertinent labs & imaging results that were available during my care of the patient were reviewed by me and considered in my medical decision making (see chart for details).  Clinical Course as of 09/25/21 1739  Tue Sep 25, 2021  1345 With radiologist Dr. Lin Landsman.  He said to elevate has the most important, alternate ice with heat.  Very infrequently this can go on to compartment syndrome where the patient may need a fasciotomy. [MB]  [id number] Reassessed patient.  Her arm is nicely elevated and she is in ice.  Compartments already feels softer.  She has no complaints. [MB]    Clinical Course User Index [MB] Hayden Rasmussen, MD   MDM Rules/Calculators/A&P                          This patient complains of swelling in right arm after extravasation of IV contrast; this involves an extensive number of treatment Options and is a complaint that carries with it a high risk of complications and Morbidity. The differential includes local reaction, compartment syndrome, tissue injury, rhabdomyolysis  I ordered, reviewed and interpreted labs, which included CBC with normal white count normal hemoglobin, chemistries normal, CK normal Previous records obtained and reviewed in epic, was admitted back in September for ruptured appendicitis I consulted Dr. Lin Landsman radiology and discussed lab and imaging findings  Critical Interventions: None  After the interventions stated above, I reevaluated the patient and found patient to be improving with elevation and ice.  Signed out to oncoming provider Dr. Dina Rich to reassess and likely can be discharged home compartments remain soft and improving.   Final Clinical Impression(s) / ED Diagnoses Final diagnoses:  Extravasation of intravenous contrast medium  Arm swelling    Rx / DC Orders ED Discharge Orders     None        Hayden Rasmussen, MD 09/25/21  [phone]

## 2021-09-25 NOTE — ED Provider Notes (Signed)
Patient signed out to me by previous provider. Please refer to their note for full HPI.  Briefly this is a 55 year old female who had an IV contrast dye study this morning through a right AC IV.  The contrast extravasated and she returns with worsening swelling of the right forearm.  Arm was reportedly 8/10, red and uncomfortable on arrival.  Vital stable.  Otherwise neurovascularly intact.  Plan for elevation ice and reevaluation. Physical Exam  BP 124/90   Pulse 94   Temp 98.4 F (36.9 C) (Oral)   Resp 17   Ht 5\' 1"  (1.549 m)   Wt 73 kg   LMP 06/18/2017   SpO2 97%   BMI 30.42 kg/m   Physical Exam Vitals and nursing note reviewed.  Constitutional:      Appearance: Normal appearance.  HENT:     Head: Normocephalic.     Mouth/Throat:     Mouth: Mucous membranes are moist.  Cardiovascular:     Rate and Rhythm: Normal rate.  Pulmonary:     Effort: Pulmonary effort is normal. No respiratory distress.  Musculoskeletal:     Comments: Slightly swollen right forearm and bicep area, soft to the touch, redness resolved, palpable radial pulse, right hand is otherwise neurovascularly intact, no findings of compartment syndrome, nontender  Skin:    General: Skin is warm.  Neurological:     Mental Status: She is alert and oriented to person, place, and time. Mental status is at baseline.  Psychiatric:        Mood and Affect: Mood normal.    ED Course/Procedures   Clinical Course as of 09/25/21 1642  Tue Sep 25, 2021  1345 With radiologist Dr. Lin Landsman.  He said to elevate has the most important, alternate ice with heat.  Very infrequently this can go on to compartment syndrome where the patient may need a fasciotomy. [MB]  8115 Reassessed patient.  Her arm is nicely elevated and she is in ice.  Compartments already feels softer.  She has no complaints. [MB]    Clinical Course User Index [MB] Hayden Rasmussen, MD    Procedures  MDM   On reevaluation the right upper extremity is  soft the touch, almost all the redness has resolved, right hand is neurovascularly intact.  No acute findings of compartment syndrome.  Plan for continued elevation and ice at home.  Went over specific discharge instructions and precautions with the patient with strict return to ED precautions.  She feels much improved, pain has resolved.  Plan for outpatient follow-up.  Patient at this time appears safe and stable for discharge and will be treated as an outpatient.  Discharge plan and strict return to ED precautions discussed, patient verbalizes understanding and agreement.       Lorelle Gibbs, DO 09/25/21 1648

## 2021-09-25 NOTE — ED Triage Notes (Signed)
Pt states she had CT scan today ~915am-"IV blew with contrast" /right arm swelling-NAD-steady gait

## 2021-09-25 NOTE — Discharge Instructions (Addendum)
Please continue to elevate your arm.  You can alternate ice and heat.  Return if any numbness tingling or any other concerns.

## 2021-09-25 NOTE — Progress Notes (Signed)
Patient with RUE contrast extravasation today in CT - patient received 100 mL 300 mg/mL Omnipaque with nearly entire amount entering into SQ. RUE IV removed and new line placed in LUE to complete scan. Per CT tech patient evaluated by ED physician and found to have no areas of open wounds/necrosis, pulses palpable, motor function in tact.   Instructions for contrast extravasation are as follows (also placed in AVS):  Patient to keep an ice pack on the area for 20-60 minutes at a time for about 48 hours.   Keep arm elevated as much as possible  Return to the ED if there is: - increase in pain or swelling - changed or altered sensation - ulceration or blistering - increasing redness - warmth or increasing firmness - decreased tissue perfusion as noted by decreased capillary refill or discoloration of skin - decreased pulses peripheral to site  [employer] PA-C 09/25/2021 9:52 AM

## 2021-09-25 NOTE — Discharge Instructions (Signed)
Keep an ice pack on the area for 20-60 minutes at a time for about 48 hours.   Keep arm elevated as much as possible  Return to the ED if there is: - increase in pain or swelling - changed or altered sensation - ulceration or blistering - increasing redness - warmth or increasing firmness - decreased tissue perfusion as noted by decreased capillary refill or discoloration of skin - decreased pulses peripheral to site

## 2021-09-26 ENCOUNTER — Telehealth: Payer: Self-pay

## 2021-09-26 NOTE — Telephone Encounter (Signed)
Received a call report from [place] at [facility] to give call report on patient's CT scan from yesterday.   IMPRESSION: 1. No clear explanation for low abdominal pain. The appendix is not visualized and by report is surgically absent. The cecum is located in the anterior right mid abdomen. Moderate stool throughout the colon. 2. Indeterminate right upper quadrant process between the liver and anterior aspect of the right 8th rib, potentially inflammatory or posttraumatic. This appears removed from the hepatic flexure of the colon. This could reflect a small area of fat necrosis. Consider follow-up noncontrast CT in 2-3 months to assess evolution. 3. Small right pleural effusion with associated mild right lower lobe atelectasis. 4. Additional incidental findings including a small uterine fibroid, right renal cortical scarring and Aortic Atherosclerosis (ICD10-I70.0). 5. Study was complicated by IV infiltration and contrast extravasation into the right arm. See discussion above in the technique section. These results will be called to the ordering clinician or representative by the Radiologist Assistant, and communication documented in the PACS or [employer].

## 2021-09-26 NOTE — Telephone Encounter (Signed)
Please see note below and Advise

## 2021-09-28 ENCOUNTER — Other Ambulatory Visit (HOSPITAL_BASED_OUTPATIENT_CLINIC_OR_DEPARTMENT_OTHER): Payer: Self-pay

## 2021-10-01 ENCOUNTER — Other Ambulatory Visit (HOSPITAL_BASED_OUTPATIENT_CLINIC_OR_DEPARTMENT_OTHER): Payer: Self-pay

## 2021-10-02 ENCOUNTER — Other Ambulatory Visit: Payer: Self-pay

## 2021-10-02 DIAGNOSIS — K581 Irritable bowel syndrome with constipation: Secondary | ICD-10-CM

## 2021-10-02 MED ORDER — POLYETHYLENE GLYCOL 3350 17 G PO PACK: PACK | ORAL | 6 refills | Status: AC

## 2021-10-24 ENCOUNTER — Other Ambulatory Visit (HOSPITAL_BASED_OUTPATIENT_CLINIC_OR_DEPARTMENT_OTHER): Payer: Self-pay

## 2021-10-24 ENCOUNTER — Other Ambulatory Visit: Payer: Self-pay | Admitting: Sports Medicine

## 2021-10-24 DIAGNOSIS — I10 Essential (primary) hypertension: Secondary | ICD-10-CM

## 2021-10-24 DIAGNOSIS — E063 Autoimmune thyroiditis: Secondary | ICD-10-CM

## 2021-10-24 MED ORDER — AMLODIPINE BESYLATE 5 MG PO TABS
ORAL_TABLET | Freq: Every day | ORAL | 1 refills | Status: DC
  Filled 2021-10-24: qty 90, 90d supply, fill #0
  Filled 2022-02-04: qty 90, 90d supply, fill #1

## 2021-10-24 MED ORDER — LEVOTHYROXINE SODIUM 75 MCG PO TABS
ORAL_TABLET | Freq: Every day | ORAL | 1 refills | Status: DC
  Filled 2021-10-24: qty 90, 90d supply, fill #0
  Filled 2022-02-04: qty 90, 90d supply, fill #1

## 2021-10-24 MED ORDER — LISINOPRIL 20 MG PO TABS
ORAL_TABLET | Freq: Every day | ORAL | 1 refills | Status: DC
  Filled 2021-10-24: qty 90, 90d supply, fill #0
  Filled 2022-02-04: qty 90, 90d supply, fill #1

## 2021-11-01 ENCOUNTER — Other Ambulatory Visit (HOSPITAL_BASED_OUTPATIENT_CLINIC_OR_DEPARTMENT_OTHER): Payer: Self-pay

## 2021-11-13 ENCOUNTER — Encounter (HOSPITAL_COMMUNITY): Payer: Self-pay | Admitting: Psychiatry

## 2021-11-13 ENCOUNTER — Telehealth (INDEPENDENT_AMBULATORY_CARE_PROVIDER_SITE_OTHER): Payer: No Typology Code available for payment source | Admitting: Psychiatry

## 2021-11-13 ENCOUNTER — Other Ambulatory Visit (HOSPITAL_BASED_OUTPATIENT_CLINIC_OR_DEPARTMENT_OTHER): Payer: Self-pay

## 2021-11-13 DIAGNOSIS — F411 Generalized anxiety disorder: Secondary | ICD-10-CM | POA: Diagnosis not present

## 2021-11-13 DIAGNOSIS — F333 Major depressive disorder, recurrent, severe with psychotic symptoms: Secondary | ICD-10-CM | POA: Diagnosis not present

## 2021-11-13 DIAGNOSIS — F5102 Adjustment insomnia: Secondary | ICD-10-CM

## 2021-11-13 MED ORDER — TRAZODONE HCL 50 MG PO TABS
50.0000 mg | ORAL_TABLET | Freq: Every day | ORAL | 0 refills | Status: DC
  Filled 2021-11-13: qty 30, 30d supply, fill #0

## 2021-11-13 MED ORDER — DULOXETINE HCL 60 MG PO CPEP
60.0000 mg | ORAL_CAPSULE | Freq: Two times a day (BID) | ORAL | 0 refills | Status: DC
  Filled 2021-11-13: qty 60, 30d supply, fill #0

## 2021-11-13 NOTE — Progress Notes (Signed)
[facility] Follow up visit   Patient Identification: Shannon Santiago MRN:  [id number] Date of Evaluation:  11/13/2021 Referral Source: [facility] discharge Chief Complaint:  establish care, depression Visit Diagnosis:    ICD-10-CM   1. MDD (major depressive disorder), recurrent, severe, with psychosis ([place])  F33.3     2. GAD (generalized anxiety disorder)  F41.1     3. Adjustment insomnia  F51.02     Virtual Visit via Video Note  I connected with Shannon Santiago on 11/13/21 at  1:15 PM EST by a video enabled telemedicine application and verified that I am speaking with the correct person using two identifiers.  Location: Patient: home Provider: work   I discussed the limitations of evaluation and management by telemedicine and the availability of in person appointments. The patient expressed understanding and agreed to proceed.      I discussed the assessment and treatment plan with the patient. The patient was provided an opportunity to ask questions and all were answered. The patient agreed with the plan and demonstrated an understanding of the instructions.   The patient was advised to call back or seek an in-person evaluation if the symptoms worsen or if the condition fails to improve as anticipated.  I provided 15 -20  minutes of non-face-to-face time during this encounter incljuding chart review, documentation , med adjustment          History of Present Illness: Patient is a 56 years old currently single Caucasian female initially referred after hospital discharge January 21 she was admitted for 4 days. 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  Consequences of Substance Abuse: NA  Past Medical History:  Past Medical History:  Diagnosis Date   Allergy    Anxiety    Asthma    Depression    Family history of breast cancer    Family history of colon cancer    Family history of lung cancer    Family history of prostate cancer    Hyperlipidemia    Hypertension    Hypothyroidism    Prolonged PTT (partial thromboplastin time) 06/23/2019   Normal PT   Thyroid disease     Past Surgical History:  Procedure Laterality Date   APPENDECTOMY     ENDOMETRIAL BIOPSY  06/21/2019   EYE SURGERY     x 5   HYSTEROSCOPY WITH D & C N/A 10/27/2019   Procedure: DILATATION AND CURETTAGE /HYSTEROSCOPY;  Surgeon: Emily Filbert, MD;  Location: [place];  Service: Gynecology;  Laterality: N/A;    Family Psychiatric History: mom depression, anxiety, alcohol use  Family History:  Family History  Problem Relation Age of Onset   Hypertension Mother    Stroke Mother    Lung cancer Mother 42   Anxiety disorder Mother    Depression Mother    Alcohol abuse Mother    Skin cancer Father    Prostate cancer Father 46   Hypertension Father  Diabetes Father    Thyroid disease Maternal Aunt    Thyroid disease Maternal Uncle    Diabetes Brother    Hypertension Brother    Colon cancer Maternal Grandmother        dx 68s, d. 12s   Prostate cancer Paternal Grandfather 68   Breast cancer Cousin        dx 20s-30s    Social History:   Social History   Socioeconomic History   Marital status: Single    Spouse name: Not on file   Number of children: Not on file   Years of education: Not on file   Highest education level: Not on file  Occupational History   Not on file  Tobacco Use   Smoking status: Never    Smokeless tobacco: Never  Vaping Use   Vaping Use: Never used  Substance and Sexual Activity   Alcohol use: Yes    Comment: occ   Drug use: Never   Sexual activity: Not Currently    Birth control/protection: Abstinence  Other Topics Concern   Not on file  Social History Narrative   Not on file   Social Determinants of Health   Financial Resource Strain: Not on file  Food Insecurity: Not on file  Transportation Needs: Not on file  Physical Activity: Not on file  Stress: Not on file  Social Connections: Not on file     Allergies:   Allergies  Allergen Reactions   Morphine And Related Other (See Comments)    Numb hands and feet   Peanut Oil Other (See Comments)    Stomach cramping, hand/foot swelling   Penicillins Hives   Polymyxin B-Trimethoprim Other (See Comments), Photosensitivity and Swelling   Abilify [Aripiprazole] Other (See Comments)    Suicidal thoughts and actions   Lurasidone Hcl     Tardive dyskinesia, akathisia   Citalopram Other (See Comments)    SSRI's ineffective - Citalopram, Fluoxetine, Sertraline, Excitalopram    Food Nausea Only, Swelling, Palpitations, Other (See Comments) and Cough    WHEAT   Prednisone Anxiety, Nausea Only, Other (See Comments) and Palpitations   Singulair [Montelukast Sodium] Other (See Comments)    Mood changes, worsening depression    Metabolic Disorder Labs: Lab Results  Component Value Date   HGBA1C 5.6 04/12/2021   MPG 114 04/12/2021   MPG 119.76 11/08/2020   Lab Results  Component Value Date   PROLACTIN 9.2 11/09/2020   PROLACTIN 10.6 06/18/2019   Lab Results  Component Value Date   CHOL 174 04/12/2021   TRIG 103 04/12/2021   HDL 40 (L) 04/12/2021   CHOLHDL 4.4 04/12/2021   VLDL 18 11/08/2020   LDLCALC 113 (H) 04/12/2021   LDLCALC 137 (H) 11/08/2020   Lab Results  Component Value Date   TSH 2.60 04/12/2021    Therapeutic Level Labs: No results found for: LITHIUM No results found for: CBMZ No  results found for: VALPROATE  Current Medications: Current Outpatient Medications  Medication Sig Dispense Refill   traZODone (DESYREL) 50 MG tablet Take 1 tablet (50 mg total) by mouth at bedtime. 30 tablet 0   Albuterol Sulfate (PROAIR RESPICLICK) 244 (90 Base) MCG/ACT AEPB Inhale 1-2 puffs into the lungs every 4 (four) hours as needed. 1 each 4   ALPRAZolam (XANAX) 0.5 MG tablet Take 0.5-1 tablets (0.25-0.5 mg total) by mouth 2 (two) times daily as needed for anxiety. 15 tablet 0   amLODipine (NORVASC) 5 MG tablet TAKE 1 TABLET BY MOUTH ONCE DAILY  90 tablet 1   Ascorbic Acid (VITAMIN C) 1000 MG tablet Take 1,000 mg by mouth daily.     atorvastatin (LIPITOR) 40 MG tablet Take 1 tablet (40 mg total) by mouth daily. 90 tablet 3   b complex vitamins tablet Take 1 tablet by mouth daily.     busPIRone (BUSPAR) 15 MG tablet TAKE 1 TABLET BY MOUTH TWICE DAILY 180 tablet 3   CALCIUM PO Take 500 mg by mouth daily.     cetirizine (ZYRTEC) 10 MG tablet Take 10 mg by mouth daily.     Cholecalciferol (VITAMIN D3) 10 MCG (400 UNIT) tablet Take 400 Units by mouth daily.     DULoxetine (CYMBALTA) 60 MG capsule TAKE 1 CAPSULE BY MOUTH ONCE DAILY 90 capsule 0   DULoxetine (CYMBALTA) 60 MG capsule Take 1 capsule (60 mg total) by mouth 2 (two) times daily. 60 capsule 0   EPINEPHrine 0.3 mg/0.3 mL IJ SOAJ injection Inject 0.3 mLs (0.3 mg total) into the muscle once as needed (anaphylaxis/allergic reaction). 1 each 3   Ferrous Sulfate (IRON PO) Take 325 mg by mouth daily.     fluticasone (FLONASE) 50 MCG/ACT nasal spray Place 1 spray into both nostrils daily. 16 g 5   Ginger, Zingiber officinalis, (GINGER ROOT PO) Take by mouth.     levothyroxine (SYNTHROID) 75 MCG tablet TAKE 1 TABLET BY MOUTH DAILY BEFORE BREAKFAST 90 tablet 1   lisinopril (ZESTRIL) 20 MG tablet TAKE 1 TABLET BY MOUTH ONCE DAILY 90 tablet 1   Magnesium 500 MG CAPS Take 500 mg by mouth daily.     Multiple Vitamin (MULTIVITAMIN) capsule Take 1  capsule by mouth daily.     Omega-3 Fatty Acids (FISH OIL) 1000 MG CAPS Take 1 capsule by mouth 2 (two) times daily.     polyethylene glycol (MIRALAX) 17 g packet Take the Miralax 17 G twice daily until Large BM then once daily. 30 each 6   Probiotic Product (PROBIOTIC ADVANCED PO) Take 1 capsule by mouth daily.     RESTASIS 0.05 % ophthalmic emulsion 1 drop 2 (two) times daily. (Patient not taking: Reported on 09/19/2021)     No current facility-administered medications for this visit.     Psychiatric Specialty Exam: Review of Systems  Cardiovascular:  Negative for leg swelling.  Psychiatric/Behavioral:  Positive for dysphoric mood and sleep disturbance. Negative for agitation and self-injury.    Last menstrual period 06/18/2017.There is no height or weight on file to calculate BMI.  General Appearance: Casual  Eye Contact:  Fair  Speech:  Clear and Coherent  Volume:  Normal  Mood: subdued  Affect:  Congruent  Thought Process:  Goal Directed  Orientation:  Full (Time, Place, and Person)  Thought Content:  Rumination  Suicidal Thoughts:  No  Homicidal Thoughts:  No  Memory:  Immediate;   Fair Recent;   Fair  Judgement:  Fair  Insight:  Fair  Psychomotor Activity:  Normal  Concentration:  Concentration: Fair and Attention Span: Fair  Recall:  [employer] of Knowledge:Good  Language: Good  Akathisia:  No  Handed:    AIMS (if indicated):  No involuntary movements  Assets:  Desire for Improvement  ADL's:  Intact  Cognition: WNL  Sleep:   variable to fair   Screenings: AUDIT    Flowsheet Row Admission (Discharged) from 11/08/2020 in [place] [facility]  Alcohol Use Disorder Identification Test Final Score (AUDIT) 3      GAD-7  Flowsheet Row Video Visit from 09/11/2020 in [place] Video Visit from 08/25/2020 in [place] Office Visit from 08/11/2020 in [place] Office Visit from 11/24/2019 in [place] Video Visit from 09/10/2019 in [place]  Total GAD-7 Score 16 16 16 15 16       PHQ2-9    [place] Office Visit from 08/17/2021 in [place] Video Visit from 11/30/2020 in [place] Video Visit from 09/11/2020 in [place] Video Visit from 08/25/2020 in [place] Office Visit from 08/11/2020 in [place]  PHQ-2 Total Score 2 3 4 6 6   PHQ-9 Total Score 7 9 20 23 25       Flowsheet Row Video Visit from 11/13/2021 in [place] ED from 09/25/2021 in [place] Video Visit from 05/23/2021 in [place] No Risk No Risk No Risk       Assessment and Plan: as follows   Prior documentation reviewed  Major depressive disorder recurrent severe without psychotic featuresi: Subdued, increase cymbalta to 60mg  bid  Continue therapy     Generalized anxiety disorder; anxious, continue cymbalta increased, continue buspar, add ME time  Insomnia: adjustment insomnia, reviewed sleep hygiene, add trazadone 50mg  qhs , can take half or one if needed  Recommend to establish care with a therapist Follow-up in 6 weeks or earlier if needed   [name], MD 1/24/20231:28 PM

## 2021-11-15 ENCOUNTER — Other Ambulatory Visit (HOSPITAL_BASED_OUTPATIENT_CLINIC_OR_DEPARTMENT_OTHER): Payer: Self-pay

## 2021-11-15 MED FILL — Buspirone HCl Tab 15 MG: ORAL | 90 days supply | Qty: 180 | Fill #2 | Status: AC

## 2021-12-19 ENCOUNTER — Telehealth (INDEPENDENT_AMBULATORY_CARE_PROVIDER_SITE_OTHER): Payer: No Typology Code available for payment source | Admitting: Psychiatry

## 2021-12-19 ENCOUNTER — Encounter (HOSPITAL_COMMUNITY): Payer: Self-pay | Admitting: Psychiatry

## 2021-12-19 ENCOUNTER — Other Ambulatory Visit (HOSPITAL_BASED_OUTPATIENT_CLINIC_OR_DEPARTMENT_OTHER): Payer: Self-pay

## 2021-12-19 DIAGNOSIS — F333 Major depressive disorder, recurrent, severe with psychotic symptoms: Secondary | ICD-10-CM

## 2021-12-19 DIAGNOSIS — F5102 Adjustment insomnia: Secondary | ICD-10-CM | POA: Diagnosis not present

## 2021-12-19 DIAGNOSIS — F411 Generalized anxiety disorder: Secondary | ICD-10-CM | POA: Diagnosis not present

## 2021-12-19 MED ORDER — DULOXETINE HCL 60 MG PO CPEP
60.0000 mg | ORAL_CAPSULE | Freq: Two times a day (BID) | ORAL | 2 refills | Status: DC
  Filled 2021-12-19: qty 60, 30d supply, fill #0
  Filled 2022-02-04: qty 60, 30d supply, fill #1
  Filled 2022-03-28: qty 60, 30d supply, fill #2

## 2021-12-19 NOTE — Progress Notes (Signed)
[facility] Follow up visit   Patient Identification: Shannon Santiago MRN:  [id number] Date of Evaluation:  12/19/2021 Referral Source: [facility] discharge Chief Complaint:  follow up depression, grief Visit Diagnosis:    ICD-10-CM   1. MDD (major depressive disorder), recurrent, severe, with psychosis ([place])  F33.3     2. GAD (generalized anxiety disorder)  F41.1     3. Adjustment insomnia  F51.02       Virtual Visit via Video Note  I connected with Shannon Santiago on 12/19/21 at  2:30 PM EST by a video enabled telemedicine application and verified that I am speaking with the correct person using two identifiers.  Location: Patient: home Provider: home offic   I discussed the limitations of evaluation and management by telemedicine and the availability of in person appointments. The patient expressed understanding and agreed to proceed.      I discussed the assessment and treatment plan with the patient. The patient was provided an opportunity to ask questions and all were answered. The patient agreed with the plan and demonstrated an understanding of the instructions.   The patient was advised to call back or seek an in-person evaluation if the symptoms worsen or if the condition fails to improve as anticipated.  I provided 15 minutes of non-face-to-face time during this encounter including chart review and documentat       History of Present Illness: Patient is a 55 years old currently single Caucasian female initially referred after hospital discharge January 21 she was admitted for 4 days. 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  Consequences of Substance Abuse: NA  Past Medical History:  Past Medical History:  Diagnosis Date   Allergy    Anxiety    Asthma    Depression    Family history of breast cancer    Family history of colon cancer    Family history of lung cancer    Family history of prostate cancer    Hyperlipidemia    Hypertension    Hypothyroidism    Prolonged PTT (partial thromboplastin time) 06/23/2019   Normal PT   Thyroid disease     Past Surgical History:  Procedure Laterality Date   APPENDECTOMY     ENDOMETRIAL BIOPSY  06/21/2019   EYE SURGERY     x 5   HYSTEROSCOPY WITH D & C N/A 10/27/2019   Procedure: DILATATION AND CURETTAGE /HYSTEROSCOPY;  Surgeon: Emily Filbert, MD;  Location: [place];  Service: Gynecology;  Laterality: N/A;    Family Psychiatric History: mom depression, anxiety, alcohol use  Family History:  Family History  Problem Relation Age of Onset   Hypertension Mother    Stroke Mother    Lung cancer Mother 49   Anxiety disorder Mother    Depression Mother    Alcohol abuse Mother    Skin cancer Father    Prostate cancer Father 40   Hypertension Father  Diabetes Father    Thyroid disease Maternal Aunt    Thyroid disease Maternal Uncle    Diabetes Brother    Hypertension Brother    Colon cancer Maternal Grandmother        dx 77s, d. 91s   Prostate cancer Paternal Grandfather 41   Breast cancer Cousin        dx 20s-30s    Social History:   Social History   Socioeconomic History   Marital status: Single    Spouse name: Not on file   Number of children: Not on file   Years of education: Not on file   Highest education level: Not on file  Occupational History   Not on  file  Tobacco Use   Smoking status: Never   Smokeless tobacco: Never  Vaping Use   Vaping Use: Never used  Substance and Sexual Activity   Alcohol use: Yes    Comment: occ   Drug use: Never   Sexual activity: Not Currently    Birth control/protection: Abstinence  Other Topics Concern   Not on file  Social History Narrative   Not on file   Social Determinants of Health   Financial Resource Strain: Not on file  Food Insecurity: Not on file  Transportation Needs: Not on file  Physical Activity: Not on file  Stress: Not on file  Social Connections: Not on file     Allergies:   Allergies  Allergen Reactions   Morphine And Related Other (See Comments)    Numb hands and feet   Peanut Oil Other (See Comments)    Stomach cramping, hand/foot swelling   Penicillins Hives   Polymyxin B-Trimethoprim Other (See Comments), Photosensitivity and Swelling   Abilify [Aripiprazole] Other (See Comments)    Suicidal thoughts and actions   Lurasidone Hcl     Tardive dyskinesia, akathisia   Citalopram Other (See Comments)    SSRI's ineffective - Citalopram, Fluoxetine, Sertraline, Excitalopram    Food Nausea Only, Swelling, Palpitations, Other (See Comments) and Cough    WHEAT   Prednisone Anxiety, Nausea Only, Other (See Comments) and Palpitations   Singulair [Montelukast Sodium] Other (See Comments)    Mood changes, worsening depression    Metabolic Disorder Labs: Lab Results  Component Value Date   HGBA1C 5.6 04/12/2021   MPG 114 04/12/2021   MPG 119.76 11/08/2020   Lab Results  Component Value Date   PROLACTIN 9.2 11/09/2020   PROLACTIN 10.6 06/18/2019   Lab Results  Component Value Date   CHOL 174 04/12/2021   TRIG 103 04/12/2021   HDL 40 (L) 04/12/2021   CHOLHDL 4.4 04/12/2021   VLDL 18 11/08/2020   LDLCALC 113 (H) 04/12/2021   LDLCALC 137 (H) 11/08/2020   Lab Results  Component Value Date   TSH 2.60 04/12/2021    Therapeutic Level Labs: No results found  for: LITHIUM No results found for: CBMZ No results found for: VALPROATE  Current Medications: Current Outpatient Medications  Medication Sig Dispense Refill   Albuterol Sulfate (PROAIR RESPICLICK) 801 (90 Base) MCG/ACT AEPB Inhale 1-2 puffs into the lungs every 4 (four) hours as needed. 1 each 4   ALPRAZolam (XANAX) 0.5 MG tablet Take 0.5-1 tablets (0.25-0.5 mg total) by mouth 2 (two) times daily as needed for anxiety. 15 tablet 0   amLODipine (NORVASC) 5 MG tablet TAKE 1 TABLET BY MOUTH ONCE DAILY 90 tablet 1   Ascorbic Acid (VITAMIN C) 1000 MG tablet Take 1,000 mg by mouth daily.  atorvastatin (LIPITOR) 40 MG tablet Take 1 tablet (40 mg total) by mouth daily. 90 tablet 3   b complex vitamins tablet Take 1 tablet by mouth daily.     busPIRone (BUSPAR) 15 MG tablet TAKE 1 TABLET BY MOUTH TWICE DAILY 180 tablet 3   CALCIUM PO Take 500 mg by mouth daily.     cetirizine (ZYRTEC) 10 MG tablet Take 10 mg by mouth daily.     Cholecalciferol (VITAMIN D3) 10 MCG (400 UNIT) tablet Take 400 Units by mouth daily.     DULoxetine (CYMBALTA) 60 MG capsule Take 1 capsule (60 mg total) by mouth 2 (two) times daily. 60 capsule 2   EPINEPHrine 0.3 mg/0.3 mL IJ SOAJ injection Inject 0.3 mLs (0.3 mg total) into the muscle once as needed (anaphylaxis/allergic reaction). 1 each 3   Ferrous Sulfate (IRON PO) Take 325 mg by mouth daily.     fluticasone (FLONASE) 50 MCG/ACT nasal spray Place 1 spray into both nostrils daily. 16 g 5   Ginger, Zingiber officinalis, (GINGER ROOT PO) Take by mouth.     levothyroxine (SYNTHROID) 75 MCG tablet TAKE 1 TABLET BY MOUTH DAILY BEFORE BREAKFAST 90 tablet 1   lisinopril (ZESTRIL) 20 MG tablet TAKE 1 TABLET BY MOUTH ONCE DAILY 90 tablet 1   Magnesium 500 MG CAPS Take 500 mg by mouth daily.     Multiple Vitamin (MULTIVITAMIN) capsule Take 1 capsule by mouth daily.     Omega-3 Fatty Acids (FISH OIL) 1000 MG CAPS Take 1 capsule by mouth 2 (two) times daily.     polyethylene  glycol (MIRALAX) 17 g packet Take the Miralax 17 G twice daily until Large BM then once daily. 30 each 6   Probiotic Product (PROBIOTIC ADVANCED PO) Take 1 capsule by mouth daily.     RESTASIS 0.05 % ophthalmic emulsion 1 drop 2 (two) times daily. (Patient not taking: Reported on 09/19/2021)     traZODone (DESYREL) 50 MG tablet Take 1 tablet (50 mg total) by mouth at bedtime. 30 tablet 0   No current facility-administered medications for this visit.     Psychiatric Specialty Exam: Review of Systems  Cardiovascular:  Negative for chest pain.  Psychiatric/Behavioral:  Positive for sleep disturbance. Negative for agitation and self-injury.    Last menstrual period 06/18/2017.There is no height or weight on file to calculate BMI.  General Appearance: Casual  Eye Contact:  Fair  Speech:  Clear and Coherent  Volume:  Normal  Mood: fair  Affect:  Congruent  Thought Process:  Goal Directed  Orientation:  Full (Time, Place, and Person)  Thought Content:  Rumination  Suicidal Thoughts:  No  Homicidal Thoughts:  No  Memory:  Immediate;   Fair Recent;   Fair  Judgement:  Fair  Insight:  Fair  Psychomotor Activity:  Normal  Concentration:  Concentration: Fair and Attention Span: Fair  Recall:  [employer] of Knowledge:Good  Language: Good  Akathisia:  No  Handed:    AIMS (if indicated):  No involuntary movements  Assets:  Desire for Improvement  ADL's:  Intact  Cognition: WNL  Sleep:   variable to fair   Screenings: AUDIT    Flowsheet Row Admission (Discharged) from 11/08/2020 in [place] [facility]  Alcohol Use Disorder Identification Test Final Score (AUDIT) 3      GAD-7    Flowsheet Row Video [facility] 09/11/2020 in [facility] Video Visit from 08/25/2020 in [place]  [facility] Office Visit from 08/11/2020 in [place] Office Visit from 11/24/2019 in  [place] Video Visit from 09/10/2019 in [place]  Total GAD-7 Score 16 16 16 15 16       PHQ2-9    [place] Office Visit from 08/17/2021 in [place] Video Visit from 11/30/2020 in [place] Video Visit from 09/11/2020 in [place] Video Visit from 08/25/2020 in [place] Visit from 08/11/2020 in [place]  PHQ-2 Total Score 2 3 4 6 6   PHQ-9 Total Score 7 9 20 23 25       Flowsheet Row Video Visit from 12/19/2021 in [place] Video Visit from 11/13/2021 in [place] ED from 09/25/2021 in [place] No Risk No Risk No Risk       Assessment and Plan: as follows  Prior documentation reviewed   Major depressive disorder recurrent severe without psychotic featuresi: Better continue cymbalta bid   Continue therapy     Generalized anxiety disorder; manageable, continue cymbalta and ME time  Insomnia: improved and does not take trazadone regularly  Fu 41m.    [name], MD 3/1/20232:41 PM

## 2021-12-24 ENCOUNTER — Other Ambulatory Visit: Payer: Self-pay

## 2021-12-24 DIAGNOSIS — K581 Irritable bowel syndrome with constipation: Secondary | ICD-10-CM

## 2021-12-24 DIAGNOSIS — R109 Unspecified abdominal pain: Secondary | ICD-10-CM

## 2021-12-25 ENCOUNTER — Telehealth: Payer: Self-pay

## 2021-12-25 ENCOUNTER — Other Ambulatory Visit: Payer: Self-pay

## 2021-12-25 DIAGNOSIS — R109 Unspecified abdominal pain: Secondary | ICD-10-CM

## 2021-12-25 NOTE — Telephone Encounter (Signed)
-----   Message from Gillermina Hu, RN sent at 10/02/2021 12:40 PM EST ----- ?Regarding: Noncotrast CT ? Jackquline Denmark, MD  ?09/30/2021 ?3:01 PM EST Back to Top ? ?Please inform the patient. ?CT AP with contrast ?-No acute abnormalities ?-Does show significant constipation ?-Inflammatory area between liver and R eighth rib. ?Radiology recommends noncontrast CT liver in 3 months. ?Plan: ?-MiraLAX 17 g p.o. twice daily until large bowel movement, then once a day. ?-Noncontrast CT liver in 3 months. ?-Very sorry that IV infiltrated during the above CT. ?Hope, IV site is doing good ?? ?Send report to family physician ? ? ? ?

## 2021-12-25 NOTE — Telephone Encounter (Signed)
Pt scheduled for repeat CT scan on 01/02/2022 at 8:00 at [facility] ?Pt to arrive at 7:30 ?Pt not to eat or drink anything 4 hours prior: ?Pt to pick up prep prior to date of CT scan: ?Pt required to have labs drawn prior to CT scan:  ?Orders for labs placed in Epic ?Left message for pt to call back  ?

## 2021-12-26 ENCOUNTER — Other Ambulatory Visit (HOSPITAL_BASED_OUTPATIENT_CLINIC_OR_DEPARTMENT_OTHER): Payer: Self-pay

## 2021-12-26 ENCOUNTER — Other Ambulatory Visit: Payer: Self-pay

## 2021-12-26 NOTE — Telephone Encounter (Signed)
-----   Message from Gillermina Hu, RN sent at 10/02/2021 12:40 PM EST ----- ?Regarding: Noncotrast CT ? Jackquline Denmark, MD  ?09/30/2021 ?3:01 PM EST Back to Top ? ?Please inform the patient. ?CT AP with contrast ?-No acute abnormalities ?-Does show significant constipation ?-Inflammatory area between liver and R eighth rib. ?Radiology recommends noncontrast CT liver in 3 months. ?Plan: ?-MiraLAX 17 g p.o. twice daily until large bowel movement, then once a day. ?-Noncontrast CT liver in 3 months. ?-Very sorry that IV infiltrated during the above CT. ?Hope, IV site is doing good ?? ?Send report to family physician ? ? ? ?

## 2021-12-26 NOTE — Telephone Encounter (Signed)
Pt scheduled for repeat CT scan for 01/02/2022 at 8:00 AM at [place]: Pt to arrive at 7:30 AM: Pt to Pick up oral Prep prior to date of CT scan. Nothing to eat or drink 4 hours prior. Pt made aware: Number given to radiology to Reschedule if needed [phone]:  ?Orders for BMP placed due to pt needing Labs before CT scan: Pt requesting labs to be done in [place] at [place] at the [facility] [address]: Order adjusted for pt: Phone number provided:  ?Pt verbalized understanding with all questions answered.  ? ?

## 2021-12-26 NOTE — Addendum Note (Signed)
Addended by: Gillermina Hu on: 12/26/2021 02:01 PM ? ? Modules accepted: Orders ? ?

## 2022-01-01 ENCOUNTER — Other Ambulatory Visit (INDEPENDENT_AMBULATORY_CARE_PROVIDER_SITE_OTHER): Payer: No Typology Code available for payment source

## 2022-01-01 ENCOUNTER — Encounter: Payer: Self-pay | Admitting: Gastroenterology

## 2022-01-01 DIAGNOSIS — R109 Unspecified abdominal pain: Secondary | ICD-10-CM

## 2022-01-01 LAB — BASIC METABOLIC PANEL
BUN: 21 mg/dL (ref 6–23)
CO2: 24 mEq/L (ref 19–32)
Calcium: 9.5 mg/dL (ref 8.4–10.5)
Chloride: 103 mEq/L (ref 96–112)
Creatinine, Ser: 0.81 mg/dL (ref 0.40–1.20)
GFR: 81.42 mL/min (ref >60.00)
Glucose, Bld: 126 mg/dL — ABNORMAL HIGH (ref 70–99)
Potassium: 4.2 mEq/L (ref 3.5–5.1)
Sodium: 137 mEq/L (ref 135–145)

## 2022-01-01 NOTE — Telephone Encounter (Signed)
LVM at [phone] to the patient to get her lab drawn today as soon as possible in the morning and pick up contrast at [facility] office if she hasn't picked up by now. Patient is scheduled for CT tomorrow. She needs to reschedule her CT if lab is not drawn today.  ?Tried to contact but  couldn't leave VM at [phone] ?

## 2022-01-02 ENCOUNTER — Ambulatory Visit (HOSPITAL_COMMUNITY): Payer: No Typology Code available for payment source

## 2022-01-02 ENCOUNTER — Other Ambulatory Visit: Payer: Self-pay

## 2022-01-02 ENCOUNTER — Encounter (HOSPITAL_BASED_OUTPATIENT_CLINIC_OR_DEPARTMENT_OTHER): Payer: Self-pay

## 2022-01-02 ENCOUNTER — Ambulatory Visit (HOSPITAL_BASED_OUTPATIENT_CLINIC_OR_DEPARTMENT_OTHER)
Admission: RE | Admit: 2022-01-02 | Discharge: 2022-01-02 | Disposition: A | Discharge status: Home / Self Care | Payer: No Typology Code available for payment source | Source: Ambulatory Visit | Attending: Gastroenterology | Admitting: Gastroenterology

## 2022-01-02 DIAGNOSIS — R109 Unspecified abdominal pain: Secondary | ICD-10-CM | POA: Diagnosis present

## 2022-01-02 MED ORDER — IOHEXOL 300 MG/ML  SOLN
100.0000 mL | Freq: Once | INTRAMUSCULAR | Status: AC | PRN
  Administered 2022-01-02: 100 mL via INTRAVENOUS

## 2022-01-03 NOTE — Progress Notes (Signed)
Please inform the patient. ?CT Abdo pelvis 12/2021 shows ?-Decrease in right upper quadrant inflammation likely fat necrosis ?-Short segment wall thickening in the ascending colon ? ?She is not having any UGI symptoms. ? ?Given above findings, should proceed with colonoscopy at [facility] ? ?Send report to family physician

## 2022-01-04 ENCOUNTER — Other Ambulatory Visit: Payer: Self-pay

## 2022-01-04 DIAGNOSIS — R109 Unspecified abdominal pain: Secondary | ICD-10-CM

## 2022-01-04 DIAGNOSIS — R9389 Abnormal findings on diagnostic imaging of other specified body structures: Secondary | ICD-10-CM

## 2022-01-14 ENCOUNTER — Encounter: Payer: Self-pay | Admitting: Gastroenterology

## 2022-01-15 ENCOUNTER — Ambulatory Visit (AMBULATORY_SURGERY_CENTER): Payer: No Typology Code available for payment source | Admitting: Gastroenterology

## 2022-01-15 ENCOUNTER — Encounter: Payer: Self-pay | Admitting: Gastroenterology

## 2022-01-15 VITALS — BP 133/73 | HR 80 | Temp 97.9°F | Resp 12 | Ht 61.0 in | Wt 159.0 lb

## 2022-01-15 DIAGNOSIS — K5730 Diverticulosis of large intestine without perforation or abscess without bleeding: Secondary | ICD-10-CM | POA: Diagnosis not present

## 2022-01-15 DIAGNOSIS — K640 First degree hemorrhoids: Secondary | ICD-10-CM

## 2022-01-15 DIAGNOSIS — D123 Benign neoplasm of transverse colon: Secondary | ICD-10-CM

## 2022-01-15 DIAGNOSIS — R1032 Left lower quadrant pain: Secondary | ICD-10-CM | POA: Diagnosis not present

## 2022-01-15 DIAGNOSIS — K5140 Inflammatory polyps of colon without complications: Secondary | ICD-10-CM | POA: Diagnosis not present

## 2022-01-15 DIAGNOSIS — R9389 Abnormal findings on diagnostic imaging of other specified body structures: Secondary | ICD-10-CM

## 2022-01-15 MED ORDER — SODIUM CHLORIDE 0.9 % IV SOLN: 500.0000 mL | Freq: Once | INTRAVENOUS | Status: DC

## 2022-01-15 NOTE — Progress Notes (Signed)
PT taken to PACU. Monitors in place. VSS. Report given to RN. 

## 2022-01-15 NOTE — Progress Notes (Signed)
? ? ?Chief Complaint: To establish care ? ?Referring Provider:  Samuel Bouche, NP    ? ? ?ASSESSMENT AND PLAN;  ? ?#1. RLQ/LLQ pain.  Recent lap appendicectomy 06/28/2021 d/t ruptured appendix. R/O any abscesses.  Neg colon Jan 2018 (rpt in 10 years) ? ?#2. IBS-C ? ?Plan: ?-Check CBC, CMP, CRP, lipase, celiac serology. ?-CT Abdo/pelvis with p.o. and IV contrast ?-Colonoscopy Jan 2028.  Earlier, if with any new problems or change in family history. ?-FU therafter ? ?HPI:   ? ?Shannon Santiago is a 56 y.o. female  ?With HTN, mild CAD, asthma, Hashimoto's thyroiditis, DJD, right plantar fasciitis, anxiety/depression ?S/P lap appendicectomy 06/28/2021 for ruptured appendix, seen for follow-up visit by Dr. Kela Millin (general surgery) was advised CT.  Since patient is [employer] employee, she would prefer to get it done here. ? ? ?Longstanding H/O IBS-C.  Mostly with constipation with pellet-like stools, abdominal bloating and lower abdominal discomfort which gets better with defecation.  She occasionally would have diarrhea.  She does not like to take medications for IBS.  She is better with probiotics alovera and ginger. Has more problems with gluten.  No history suggestive of lactose intolerance. ? ?Denies having any upper GI symptoms including heartburn, regurgitation, odynophagia or dysphagia.  She denies having any nausea or vomiting.  No fever chills or night sweats.  No recent weight loss. ? ?No family history of colon cancer in a first-degree relative. GM had colon ca. No FH of polyps. ? ?Past GI procedures: ?Screening colonoscopy Jule Ser) 11/08/2016 normal to TI.  Repeat in 10 years. ?EGD: [facility] early 44s ?Past Medical History:  ?Diagnosis Date  ? Allergy   ? Anxiety   ? Asthma   ? Depression   ? Family history of breast cancer   ? Family history of colon cancer   ? Family history of lung cancer   ? Family history of prostate cancer   ? Hyperlipidemia   ? Hypertension   ? Hypothyroidism   ? Prolonged PTT  (partial thromboplastin time) 06/23/2019  ? Normal PT  ? Thyroid disease   ? ? ?Past Surgical History:  ?Procedure Laterality Date  ? APPENDECTOMY    ? ENDOMETRIAL BIOPSY  06/21/2019  ? EYE SURGERY    ? x 5  ? HYSTEROSCOPY WITH D & C N/A 10/27/2019  ? Procedure: DILATATION AND CURETTAGE /HYSTEROSCOPY;  Surgeon: Emily Filbert, MD;  Location: [place];  Service: Gynecology;  Laterality: N/A;  ? ? ?Family History  ?Problem Relation Age of Onset  ? Hypertension Mother   ? Stroke Mother   ? Lung cancer Mother 61  ? Anxiety disorder Mother   ? Depression Mother   ? Alcohol abuse Mother   ? Skin cancer Father   ? Prostate cancer Father 77  ? Hypertension Father   ? Diabetes Father   ? Thyroid disease Maternal Aunt   ? Thyroid disease Maternal Uncle   ? Diabetes Brother   ? Hypertension Brother   ? Colon cancer Maternal Grandmother   ?     dx 5s, d. 40s  ? Prostate cancer Paternal Grandfather 31  ? Breast cancer Cousin   ?     dx 20s-30s  ? ? ?Social History  ? ?Tobacco Use  ? Smoking status: Never  ? Smokeless tobacco: Never  ?Vaping Use  ? Vaping Use: Never used  ?Substance Use Topics  ? Alcohol use: Yes  ?  Comment: occ  ? Drug use: Never  ? ? ?  Current Outpatient Medications  ?Medication Sig Dispense Refill  ? Albuterol Sulfate (PROAIR RESPICLICK) 170 (90 Base) MCG/ACT AEPB Inhale 1-2 puffs into the lungs every 4 (four) hours as needed. 1 each 4  ? ALPRAZolam (XANAX) 0.5 MG tablet Take 0.5-1 tablets (0.25-0.5 mg total) by mouth 2 (two) times daily as needed for anxiety. 15 tablet 0  ? amLODipine (NORVASC) 5 MG tablet TAKE 1 TABLET BY MOUTH ONCE DAILY 90 tablet 1  ? Ascorbic Acid (VITAMIN C) 1000 MG tablet Take 1,000 mg by mouth daily.    ? atorvastatin (LIPITOR) 40 MG tablet Take 1 tablet (40 mg total) by mouth daily. 90 tablet 3  ? b complex vitamins tablet Take 1 tablet by mouth daily.    ? busPIRone (BUSPAR) 15 MG tablet TAKE 1 TABLET BY MOUTH TWICE DAILY 180 tablet 3  ? CALCIUM PO Take 500 mg by mouth  daily.    ? cetirizine (ZYRTEC) 10 MG tablet Take 10 mg by mouth daily.    ? Cholecalciferol (VITAMIN D3) 10 MCG (400 UNIT) tablet Take 400 Units by mouth daily.    ? DULoxetine (CYMBALTA) 60 MG capsule Take 1 capsule (60 mg total) by mouth 2 (two) times daily. 60 capsule 2  ? EPINEPHrine 0.3 mg/0.3 mL IJ SOAJ injection Inject 0.3 mLs (0.3 mg total) into the muscle once as needed (anaphylaxis/allergic reaction). 1 each 3  ? Ferrous Sulfate (IRON PO) Take 325 mg by mouth daily.    ? fluticasone (FLONASE) 50 MCG/ACT nasal spray Place 1 spray into both nostrils daily. 16 g 5  ? Ginger, Zingiber officinalis, (GINGER ROOT PO) Take by mouth.    ? levothyroxine (SYNTHROID) 75 MCG tablet TAKE 1 TABLET BY MOUTH DAILY BEFORE BREAKFAST 90 tablet 1  ? lisinopril (ZESTRIL) 20 MG tablet TAKE 1 TABLET BY MOUTH ONCE DAILY 90 tablet 1  ? Magnesium 500 MG CAPS Take 500 mg by mouth daily.    ? Multiple Vitamin (MULTIVITAMIN) capsule Take 1 capsule by mouth daily.    ? Omega-3 Fatty Acids (FISH OIL) 1000 MG CAPS Take 1 capsule by mouth 2 (two) times daily.    ? polyethylene glycol (MIRALAX) 17 g packet Take the Miralax 17 G twice daily until Large BM then once daily. 30 each 6  ? Probiotic Product (PROBIOTIC ADVANCED PO) Take 1 capsule by mouth daily.    ? RESTASIS 0.05 % ophthalmic emulsion 1 drop 2 (two) times daily. (Patient not taking: Reported on 09/19/2021)    ? traZODone (DESYREL) 50 MG tablet Take 1 tablet (50 mg total) by mouth at bedtime. 30 tablet 0  ? ?Current Facility-Administered Medications  ?Medication Dose Route Frequency Provider Last Rate Last Admin  ? 0.9 %  sodium chloride infusion  500 mL Intravenous Once Jackquline Denmark, MD      ? ? ?Allergies  ?Allergen Reactions  ? Morphine And Related Other (See Comments)  ?  Numb hands and feet  ? Peanut Oil Other (See Comments)  ?  Stomach cramping, hand/foot swelling  ? Penicillins Hives  ? Polymyxin B-Trimethoprim Other (See Comments), Photosensitivity and Swelling  ?  Abilify [Aripiprazole] Other (See Comments)  ?  Suicidal thoughts and actions  ? Lurasidone Hcl   ?  Tardive dyskinesia, akathisia  ? Citalopram Other (See Comments)  ?  SSRI's ineffective - Citalopram, Fluoxetine, Sertraline, Excitalopram   ? Food Nausea Only, Swelling, Palpitations, Other (See Comments) and Cough  ?  WHEAT  ? Prednisone Anxiety, Nausea Only, Other (See Comments)  and Palpitations  ? Singulair [Montelukast Sodium] Other (See Comments)  ?  Mood changes, worsening depression  ? ? ?Review of Systems:  ?Constitutional: Denies fever, chills, diaphoresis, appetite change and has fatigue.  ?HEENT: Has allergies ?Respiratory: Denies SOB, DOE, cough, chest tightness,  and wheezing.   ?Cardiovascular: Denies chest pain, palpitations and leg swelling.  ?Genitourinary: Denies dysuria, urgency, frequency, hematuria, flank pain and difficulty urinating.  ?Musculoskeletal: Denies myalgias, Has back pain, joint swelling, arthralgias and gait problem.  ?Skin: No rash.  ?Neurological: Denies dizziness, seizures, syncope, weakness, light-headedness, numbness and has headaches.  ?Hematological: Denies adenopathy. Easy bruising, personal or family bleeding history  ?Psychiatric/Behavioral: has anxiety or depression. Has sleeping problems. ? ?  ? ?Physical Exam:   ? ?BP 121/88   Pulse 91   Temp 97.9 ?F (36.6 ?C) (Temporal)   Ht '5\' 1"'$  (1.549 m)   Wt 159 lb (72.1 kg)   LMP 06/18/2017   SpO2 96%   BMI 30.04 kg/m?  ?Wt Readings from Last 3 Encounters:  ?01/15/22 159 lb (72.1 kg)  ?09/25/21 161 lb (73 kg)  ?09/19/21 159 lb 2 oz (72.2 kg)  ? ?Constitutional:  Well-developed, in no acute distress. ?Psychiatric: Normal mood and affect. Behavior is normal. ?HEENT: Pupils normal.  Conjunctivae are normal. No scleral icterus. ?Neck supple.  ?Cardiovascular: Normal rate, regular rhythm. No edema ?Pulmonary/chest: Effort normal and breath sounds normal. No wheezing, rales or rhonchi. ?Abdominal: Soft, nondistended. Mild  LLQ/RLQ abdominal tenderness.  No rebound. Bowel sounds active throughout. There are no masses palpable. No hepatomegaly. ?Rectal: Deferred ?Neurological: Alert and oriented to person place and time. ?Skin: Skin is warm and

## 2022-01-15 NOTE — Op Note (Signed)
[place] ?Patient Name: Shannon Santiago ?Procedure Date: 01/15/2022 3:31 PM ?MRN: [id number] ?Endoscopist: Jackquline Denmark , MD ?Age: 56 ?Referring MD:  ?Date of Birth: September 01, 1966 ?Gender: Female ?Account #: [id number] ?Procedure:                Colonoscopy ?Indications:              Abnormal CT of the GI tract showing ascending colon  ?                          thickening. ?Medicines:                Monitored Anesthesia Care ?Procedure:                Pre-Anesthesia Assessment: ?                          - Prior to the procedure, a History and Physical  ?                          was performed, and patient medications and  ?                          allergies were reviewed. The patient's tolerance of  ?                          previous anesthesia was also reviewed. The risks  ?                          and benefits of the procedure and the sedation  ?                          options and risks were discussed with the patient.  ?                          All questions were answered, and informed consent  ?                          was obtained. Prior Anticoagulants: The patient has  ?                          taken no previous anticoagulant or antiplatelet  ?                          agents. ASA Grade Assessment: II - A patient with  ?                          mild systemic disease. After reviewing the risks  ?                          and benefits, the patient was deemed in  ?                          satisfactory condition to undergo the procedure. ?  After obtaining informed consent, the colonoscope  ?                          was passed under direct vision. Throughout the  ?                          procedure, the patient's blood pressure, pulse, and  ?                          oxygen saturations were monitored continuously. The  ?                          PCF-HQ190L Colonoscope was introduced through the  ?                          anus and advanced to the 2 cm into the ileum.  The  ?                          colonoscopy was performed without difficulty. The  ?                          patient tolerated the procedure well. The quality  ?                          of the bowel preparation was adequate to identify  ?                          polyps. Retained stool in several areas of the  ?                          colon. Aggressive suctioning and aspiration was  ?                          performed. Approximately 85-90% of the colonic  ?                          mucosa was visualized satisfactorily. The terminal  ?                          ileum, ileocecal valve, appendiceal orifice, and  ?                          rectum were photographed. ?Scope In: 3:44:36 PM ?Scope Out: 4:08:13 PM ?Scope Withdrawal Time: 0 hours 18 minutes 10 seconds  ?Total Procedure Duration: 0 hours 23 minutes 37 seconds  ?Findings:                 Two sessile polyps were found in the hepatic  ?                          flexure. The polyps were 2 to 4 mm in size. These  ?                          polyps were removed with  a cold snare. Resection  ?                          and retrieval were complete. ?                          A few small-mouthed diverticula were found in the  ?                          sigmoid colon. ?                          Non-bleeding internal hemorrhoids were found during  ?                          retroflexion. The hemorrhoids were small and Grade  ?                          I (internal hemorrhoids that do not prolapse). ?                          The terminal ileum appeared normal. ?                          The exam was otherwise without abnormality on  ?                          direct and retroflexion views. The colon was highly  ?                          tortuous. ?Complications:            No immediate complications. ?Estimated Blood Loss:     Estimated blood loss: none. ?Impression:               - Two 2 to 4 mm polyps at the hepatic flexure,  ?                          removed with a  cold snare. Resected and retrieved. ?                          - Mild sigmoid diverticulosis. ?                          - Non-bleeding internal hemorrhoids. ?                          - The examined portion of the ileum was normal. ?                          - The examination was otherwise normal on direct  ?                          and retroflexion views. ?Recommendation:           - Patient has a contact number available for  ?  emergencies. The signs and symptoms of potential  ?                          delayed complications were discussed with the  ?                          patient. Return to normal activities tomorrow.  ?                          Written discharge instructions were provided to the  ?                          patient. ?                          - Resume previous diet. ?                          - Continue present medications. ?                          - Await pathology results. ?                          - Miralax 17g po QD with 8 ounces of water. ?                          - Repeat colonoscopy for surveillance based on  ?                          pathology results. ?                          - The findings and recommendations were discussed  ?                          with the patient's family. ?Jackquline Denmark, MD ?01/15/2022 4:18:50 PM ?This report has been signed electronically. ?

## 2022-01-15 NOTE — Progress Notes (Signed)
Called to room to assist during endoscopic procedure.  Patient ID and intended procedure confirmed with present staff. Received instructions for my participation in the procedure from the performing physician.  

## 2022-01-15 NOTE — Patient Instructions (Signed)
Handouts on polyps given to patient.  ?Await pathology results. ?Resume previous diet and continue present medications. ?Miralax 17g daily in the morning with 8 ounces of water recommended ?Repeat colonoscopy for screening purposes will be determined based off of pathology results. ? ? ?YOU HAD AN ENDOSCOPIC PROCEDURE TODAY AT [facility]:   Refer to the procedure report that was given to you for any specific questions about what was found during the examination.  If the procedure report does not answer your questions, please call your gastroenterologist to clarify.  If you requested that your care partner not be given the details of your procedure findings, then the procedure report has been included in a sealed envelope for you to review at your convenience later. ? ?YOU SHOULD EXPECT: Some feelings of bloating in the abdomen. Passage of more gas than usual.  Walking can help get rid of the air that was put into your GI tract during the procedure and reduce the bloating. If you had a lower endoscopy (such as a colonoscopy or flexible sigmoidoscopy) you may notice spotting of blood in your stool or on the toilet paper. If you underwent a bowel prep for your procedure, you may not have a normal bowel movement for a few days. ? ?Please Note:  You might notice some irritation and congestion in your nose or some drainage.  This is from the oxygen used during your procedure.  There is no need for concern and it should clear up in a day or so. ? ?SYMPTOMS TO REPORT IMMEDIATELY: ? ?Following lower endoscopy (colonoscopy or flexible sigmoidoscopy): ? Excessive amounts of blood in the stool ? Significant tenderness or worsening of abdominal pains ? Swelling of the abdomen that is new, acute ? Fever of 100?F or higher ? ?For urgent or emergent issues, a gastroenterologist can be reached at any hour by calling [phone]. ?Do not use MyChart messaging for urgent concerns.  ? ? ?DIET:  We do recommend a  small meal at first, but then you may proceed to your regular diet.  Drink plenty of fluids but you should avoid alcoholic beverages for 24 hours. ? ?ACTIVITY:  You should plan to take it easy for the rest of today and you should NOT DRIVE or use heavy machinery until tomorrow (because of the sedation medicines used during the test).   ? ?FOLLOW UP: ?Our staff will call the number listed on your records 48-72 hours following your procedure to check on you and address any questions or concerns that you may have regarding the information given to you following your procedure. If we do not reach you, we will leave a message.  We will attempt to reach you two times.  During this call, we will ask if you have developed any symptoms of COVID 19. If you develop any symptoms (ie: fever, flu-like symptoms, shortness of breath, cough etc.) before then, please call [phone].  If you test positive for Covid 19 in the 2 weeks post procedure, please call and report this information to [place].   ? ?If any biopsies were taken you will be contacted by phone or by letter within the next 1-3 weeks.  Please call us at [phone] if you have not heard about the biopsies in 3 weeks.  ? ? ?SIGNATURES/CONFIDENTIALITY: ?You and/or your care partner have signed paperwork which will be entered into your electronic medical record.  These signatures attest to the fact that that the information above on your After  Visit Summary has been reviewed and is understood.  Full responsibility of the confidentiality of this discharge information lies with you and/or your care-partner.  ?

## 2022-01-17 ENCOUNTER — Telehealth: Payer: Self-pay

## 2022-01-17 NOTE — Telephone Encounter (Signed)
Left message on follow up call. 

## 2022-01-17 NOTE — Telephone Encounter (Signed)
?  Follow up Call- ? ? ?  01/15/2022  ?  3:24 PM  ?Call back number  ?Post procedure Call Back phone  # [phone]  ?Permission to leave phone message Yes  ? Follow up call, LVM ?

## 2022-01-27 ENCOUNTER — Encounter: Payer: Self-pay | Admitting: Gastroenterology

## 2022-02-04 ENCOUNTER — Other Ambulatory Visit (HOSPITAL_BASED_OUTPATIENT_CLINIC_OR_DEPARTMENT_OTHER): Payer: Self-pay

## 2022-02-15 ENCOUNTER — Ambulatory Visit: Payer: No Typology Code available for payment source | Admitting: Medical-Surgical

## 2022-03-28 ENCOUNTER — Other Ambulatory Visit: Payer: Self-pay | Admitting: Cardiology

## 2022-03-28 ENCOUNTER — Other Ambulatory Visit (HOSPITAL_BASED_OUTPATIENT_CLINIC_OR_DEPARTMENT_OTHER): Payer: Self-pay

## 2022-03-28 ENCOUNTER — Other Ambulatory Visit: Payer: Self-pay | Admitting: Osteopathic Medicine

## 2022-03-28 ENCOUNTER — Other Ambulatory Visit (HOSPITAL_COMMUNITY): Payer: Self-pay | Admitting: Psychiatry

## 2022-03-28 NOTE — Telephone Encounter (Signed)
[facility] requesting [facility] refill for buspar. Rx no listed in active med list.

## 2022-03-29 ENCOUNTER — Other Ambulatory Visit (HOSPITAL_BASED_OUTPATIENT_CLINIC_OR_DEPARTMENT_OTHER): Payer: Self-pay

## 2022-03-29 MED ORDER — ATORVASTATIN CALCIUM 40 MG PO TABS
40.0000 mg | ORAL_TABLET | Freq: Every day | ORAL | 0 refills | Status: DC
  Filled 2022-03-29: qty 90, 90d supply, fill #0

## 2022-03-29 NOTE — Telephone Encounter (Signed)
Noted. Pharmacy notified that rx is managed by [facility].

## 2022-04-01 ENCOUNTER — Other Ambulatory Visit: Payer: Self-pay | Admitting: Osteopathic Medicine

## 2022-04-01 ENCOUNTER — Other Ambulatory Visit: Payer: Self-pay | Admitting: Psychiatry

## 2022-04-01 ENCOUNTER — Telehealth (HOSPITAL_COMMUNITY): Payer: Self-pay

## 2022-04-01 ENCOUNTER — Other Ambulatory Visit (HOSPITAL_BASED_OUTPATIENT_CLINIC_OR_DEPARTMENT_OTHER): Payer: Self-pay

## 2022-04-01 MED ORDER — BUSPIRONE HCL 15 MG PO TABS
15.0000 mg | ORAL_TABLET | Freq: Two times a day (BID) | ORAL | 0 refills | Status: DC
  Filled 2022-04-01 (×2): qty 180, 90d supply, fill #0

## 2022-04-01 MED ORDER — BUSPIRONE HCL 15 MG PO TABS
ORAL_TABLET | Freq: Two times a day (BID) | ORAL | 0 refills | Status: DC
  Filled 2022-04-01: qty 180, 90d supply, fill #0

## 2022-04-01 MED ORDER — TRAZODONE HCL 50 MG PO TABS
50.0000 mg | ORAL_TABLET | Freq: Every day | ORAL | 0 refills | Status: DC
  Filled 2022-04-01: qty 30, 30d supply, fill #0

## 2022-04-01 NOTE — Telephone Encounter (Signed)
Medication refill - Telephone call with patient, after she left a voice message she is in need of a new Buspar 15 mg, 1 twice a day order.  Patient stated she would be out on tomorrow, 04/02/22 so she would like this sent in today so she does not run out. Patient stated she is aware of appointment set for 04/03/22 and plans to keep the appointment but would like a new Buspar order to be sent into her [place].  Agreed to send request to Dr. De Nurse to send in prior to their appointment set for 04/03/22 if he would agree.

## 2022-04-01 NOTE — Telephone Encounter (Signed)
Medication management - Called patient back to inform Dr. De Nurse had sent in her new Buspar 15 mg, one twice a day order to her [place].  Collateral appreciative of assistance.

## 2022-04-03 ENCOUNTER — Encounter (HOSPITAL_COMMUNITY): Payer: Self-pay | Admitting: Psychiatry

## 2022-04-03 ENCOUNTER — Telehealth (INDEPENDENT_AMBULATORY_CARE_PROVIDER_SITE_OTHER): Payer: No Typology Code available for payment source | Admitting: Psychiatry

## 2022-04-03 DIAGNOSIS — F333 Major depressive disorder, recurrent, severe with psychotic symptoms: Secondary | ICD-10-CM | POA: Diagnosis not present

## 2022-04-03 DIAGNOSIS — F5102 Adjustment insomnia: Secondary | ICD-10-CM | POA: Diagnosis not present

## 2022-04-03 DIAGNOSIS — F411 Generalized anxiety disorder: Secondary | ICD-10-CM | POA: Diagnosis not present

## 2022-04-03 NOTE — Progress Notes (Signed)
[place] Follow up visit   Patient Identification: Shannon Santiago MRN:  [id number] Date of Evaluation:  04/03/2022 Referral Source: [facility] discharge Chief Complaint:  follow up depression, grief Visit Diagnosis:    ICD-10-CM   1. MDD (major depressive disorder), recurrent, severe, with psychosis ([place])  F33.3     2. GAD (generalized anxiety disorder)  F41.1     3. Adjustment insomnia  F51.02       Virtual Visit via Video Note  I connected with Shannon Santiago on 04/03/22 at  2:00 PM EDT by a video enabled telemedicine application and verified that I am speaking with the correct person using two identifiers.  Location: Patient: home Provider: home office   I discussed the limitations of evaluation and management by telemedicine and the availability of in person appointments. The patient expressed understanding and agreed to proceed.      I discussed the assessment and treatment plan with the patient. The patient was provided an opportunity to ask questions and all were answered. The patient agreed with the plan and demonstrated an understanding of the instructions.   The patient was advised to call back or seek an in-person evaluation if the symptoms worsen or if the condition fails to improve as anticipated.  I provided 15 minutes of non-face-to-face time during this encounter including chart review and documentat       History of Present Illness: Patient is a 56 years old currently single Caucasian female initially referred after hospital discharge January 21 she was admitted for 4 days. 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  Consequences of Substance Abuse: NA  Past Medical History:  Past Medical History:  Diagnosis Date   Allergy    Anxiety    Asthma    Depression    Family history of breast cancer    Family history of colon cancer    Family history of lung cancer    Family history of prostate cancer    Hyperlipidemia    Hypertension    Hypothyroidism    Prolonged PTT (partial thromboplastin time) 06/23/2019   Normal PT   Thyroid disease     Past Surgical History:  Procedure Laterality Date   APPENDECTOMY     ENDOMETRIAL BIOPSY  06/21/2019   EYE SURGERY     x 5   HYSTEROSCOPY WITH D & C N/A 10/27/2019   Procedure: DILATATION AND CURETTAGE /HYSTEROSCOPY;  Surgeon: Emily Filbert, MD;  Location: [place];  Service: Gynecology;  Laterality: N/A;    Family Psychiatric History: mom depression, anxiety, alcohol use  Family History:  Family History  Problem Relation Age of Onset   Hypertension Mother    Stroke Mother    Lung cancer Mother 42   Anxiety disorder Mother    Depression Mother    Alcohol abuse Mother    Skin cancer Father    Prostate cancer Father 39   Hypertension Father    Diabetes Father    Thyroid disease Maternal Aunt  Thyroid disease Maternal Uncle    Diabetes Brother    Hypertension Brother    Colon cancer Maternal Grandmother        dx 59s, d. 87s   Prostate cancer Paternal Grandfather 65   Breast cancer Cousin        dx 20s-30s    Social History:   Social History   Socioeconomic History   Marital status: Single    Spouse name: Not on file   Number of children: Not on file   Years of education: Not on file   Highest education level: Not on file  Occupational History   Not on file  Tobacco Use   Smoking status: Never   Smokeless tobacco: Never  Vaping Use    Vaping Use: Never used  Substance and Sexual Activity   Alcohol use: Yes    Comment: occ   Drug use: Never   Sexual activity: Not Currently    Birth control/protection: Abstinence  Other Topics Concern   Not on file  Social History Narrative   Not on file   Social Determinants of Health   Financial Resource Strain: Not on file  Food Insecurity: Not on file  Transportation Needs: Not on file  Physical Activity: Not on file  Stress: Not on file  Social Connections: Not on file     Allergies:   Allergies  Allergen Reactions   Morphine And Related Other (See Comments)    Numb hands and feet   Peanut Oil Other (See Comments)    Stomach cramping, hand/foot swelling   Penicillins Hives   Polymyxin B-Trimethoprim Other (See Comments), Photosensitivity and Swelling   Abilify [Aripiprazole] Other (See Comments)    Suicidal thoughts and actions   Lurasidone Hcl     Tardive dyskinesia, akathisia   Citalopram Other (See Comments)    SSRI's ineffective - Citalopram, Fluoxetine, Sertraline, Excitalopram    Food Nausea Only, Swelling, Palpitations, Other (See Comments) and Cough    WHEAT   Prednisone Anxiety, Nausea Only, Other (See Comments) and Palpitations   Singulair [Montelukast Sodium] Other (See Comments)    Mood changes, worsening depression    Metabolic Disorder Labs: Lab Results  Component Value Date   HGBA1C 5.6 04/12/2021   MPG 114 04/12/2021   MPG 119.76 11/08/2020   Lab Results  Component Value Date   PROLACTIN 9.2 11/09/2020   PROLACTIN 10.6 06/18/2019   Lab Results  Component Value Date   CHOL 174 04/12/2021   TRIG 103 04/12/2021   HDL 40 (L) 04/12/2021   CHOLHDL 4.4 04/12/2021   VLDL 18 11/08/2020   LDLCALC 113 (H) 04/12/2021   LDLCALC 137 (H) 11/08/2020   Lab Results  Component Value Date   TSH 2.60 04/12/2021    Therapeutic Level Labs: No results found for: "LITHIUM" No results found for: "CBMZ" No results found for:  "VALPROATE"  Current Medications: Current Outpatient Medications  Medication Sig Dispense Refill   Albuterol Sulfate (PROAIR RESPICLICK) 914 (90 Base) MCG/ACT AEPB Inhale 1-2 puffs into the lungs every 4 (four) hours as needed. 1 each 4   ALPRAZolam (XANAX) 0.5 MG tablet Take 0.5-1 tablets (0.25-0.5 mg total) by mouth 2 (two) times daily as needed for anxiety. 15 tablet 0   amLODipine (NORVASC) 5 MG tablet TAKE 1 TABLET BY MOUTH ONCE DAILY 90 tablet 1   Ascorbic Acid (VITAMIN C) 1000 MG tablet Take 1,000 mg by mouth daily.     atorvastatin (LIPITOR) 40 MG tablet Take 1 tablet (40 mg  total) by mouth daily. 90 tablet 0   b complex vitamins tablet Take 1 tablet by mouth daily.     busPIRone (BUSPAR) 15 MG tablet Take 1 tablet (15 mg total) by mouth 2 (two) times daily. Delete prior refill 180 tablet 0   CALCIUM PO Take 500 mg by mouth daily.     cetirizine (ZYRTEC) 10 MG tablet Take 10 mg by mouth daily.     Cholecalciferol (VITAMIN D3) 10 MCG (400 UNIT) tablet Take 400 Units by mouth daily.     DULoxetine (CYMBALTA) 60 MG capsule Take 1 capsule (60 mg total) by mouth 2 (two) times daily. 60 capsule 2   EPINEPHrine 0.3 mg/0.3 mL IJ SOAJ injection Inject 0.3 mLs (0.3 mg total) into the muscle once as needed (anaphylaxis/allergic reaction). 1 each 3   Ferrous Sulfate (IRON PO) Take 325 mg by mouth daily.     fluticasone (FLONASE) 50 MCG/ACT nasal spray Place 1 spray into both nostrils daily. 16 g 5   Ginger, Zingiber officinalis, (GINGER ROOT PO) Take by mouth.     levothyroxine (SYNTHROID) 75 MCG tablet TAKE 1 TABLET BY MOUTH DAILY BEFORE BREAKFAST 90 tablet 1   lisinopril (ZESTRIL) 20 MG tablet TAKE 1 TABLET BY MOUTH ONCE DAILY 90 tablet 1   Magnesium 500 MG CAPS Take 500 mg by mouth daily.     Multiple Vitamin (MULTIVITAMIN) capsule Take 1 capsule by mouth daily.     Omega-3 Fatty Acids (FISH OIL) 1000 MG CAPS Take 1 capsule by mouth 2 (two) times daily.     polyethylene glycol (MIRALAX) 17 g  packet Take the Miralax 17 G twice daily until Large BM then once daily. 30 each 6   Probiotic Product (PROBIOTIC ADVANCED PO) Take 1 capsule by mouth daily.     RESTASIS 0.05 % ophthalmic emulsion 1 drop 2 (two) times daily. (Patient not taking: Reported on 09/19/2021)     traZODone (DESYREL) 50 MG tablet Take 1 tablet (50 mg total) by mouth at bedtime. 30 tablet 0   No current facility-administered medications for this visit.     Psychiatric Specialty Exam: Review of Systems  Cardiovascular:  Negative for chest pain.  Psychiatric/Behavioral:  Positive for sleep disturbance. Negative for agitation and self-injury.     Last menstrual period 06/18/2017.There is no height or weight on file to calculate BMI.  General Appearance: Casual  Eye Contact:  Fair  Speech:  Clear and Coherent  Volume:  Normal  Mood: fair  Affect:  Congruent  Thought Process:  Goal Directed  Orientation:  Full (Time, Place, and Person)  Thought Content:  Rumination  Suicidal Thoughts:  No  Homicidal Thoughts:  No  Memory:  Immediate;   Fair Recent;   Fair  Judgement:  Fair  Insight:  Fair  Psychomotor Activity:  Normal  Concentration:  Concentration: Fair and Attention Span: Fair  Recall:  [employer] of Knowledge:Good  Language: Good  Akathisia:  No  Handed:    AIMS (if indicated):  No involuntary movements  Assets:  Desire for Improvement  ADL's:  Intact  Cognition: WNL  Sleep:   variable to fair   Screenings: AUDIT    Flowsheet Row Admission (Discharged) from 11/08/2020 in [facility]  Alcohol Use Disorder Identification Test Final Score (AUDIT) 3      GAD-7    Flowsheet Row Video Visit from 09/11/2020 in [facility] Video Visit from 08/25/2020 in [facility] [place] Visit from 08/11/2020 in [place] [facility] Visit from 11/24/2019 in [place] Video Visit from 09/10/2019 in [place]  Total GAD-7 Score '16 16 16 15 16      '$ PHQ2-9    [place] Office Visit from 08/17/2021 in [place] Video Visit from 11/30/2020 in [place] Video Visit from 09/11/2020 in [place] Video Visit from 08/25/2020 in [place] Visit from 08/11/2020 in [place]  PHQ-2 Total Score '2 3 4 6 6  '$ PHQ-9 Total Score '7 9 20 23 25      '$ Flowsheet Row Video Visit from 04/03/2022 in [place] Video Visit from 12/19/2021 in [place] Video Visit from 11/13/2021 in [place] No Risk No Risk No Risk       Assessment and Plan: as follows  Prior documentation reviewed   Major depressive disorder recurrent severe without psychotic featuresi: Manageable continue cymbalta   Continue therapy     Generalized anxiety disorder; fluctuates, but overall no change in med needed continue buspar cymbalta  Insomnia: improved and does not take trazadone regularly  Fu 20m    NMerian Capron MD 6/14/20232:13 PM

## 2022-04-12 ENCOUNTER — Other Ambulatory Visit (HOSPITAL_BASED_OUTPATIENT_CLINIC_OR_DEPARTMENT_OTHER): Payer: Self-pay

## 2022-04-25 ENCOUNTER — Other Ambulatory Visit (HOSPITAL_COMMUNITY): Payer: Self-pay | Admitting: Psychiatry

## 2022-04-25 ENCOUNTER — Other Ambulatory Visit (HOSPITAL_BASED_OUTPATIENT_CLINIC_OR_DEPARTMENT_OTHER): Payer: Self-pay

## 2022-04-25 MED ORDER — DULOXETINE HCL 60 MG PO CPEP
60.0000 mg | ORAL_CAPSULE | Freq: Two times a day (BID) | ORAL | 1 refills | Status: DC
  Filled 2022-04-25: qty 60, 30d supply, fill #0
  Filled 2022-05-27: qty 60, 30d supply, fill #1

## 2022-04-29 ENCOUNTER — Other Ambulatory Visit (HOSPITAL_BASED_OUTPATIENT_CLINIC_OR_DEPARTMENT_OTHER): Payer: Self-pay

## 2022-04-30 ENCOUNTER — Other Ambulatory Visit: Payer: Self-pay | Admitting: Medical-Surgical

## 2022-04-30 DIAGNOSIS — E063 Autoimmune thyroiditis: Secondary | ICD-10-CM

## 2022-04-30 DIAGNOSIS — I10 Essential (primary) hypertension: Secondary | ICD-10-CM

## 2022-05-01 ENCOUNTER — Other Ambulatory Visit (HOSPITAL_BASED_OUTPATIENT_CLINIC_OR_DEPARTMENT_OTHER): Payer: Self-pay

## 2022-05-03 ENCOUNTER — Encounter (HOSPITAL_COMMUNITY): Payer: Self-pay

## 2022-05-03 ENCOUNTER — Other Ambulatory Visit: Payer: Self-pay

## 2022-05-03 ENCOUNTER — Other Ambulatory Visit (HOSPITAL_BASED_OUTPATIENT_CLINIC_OR_DEPARTMENT_OTHER): Payer: Self-pay

## 2022-05-03 ENCOUNTER — Emergency Department (HOSPITAL_COMMUNITY)
Admission: EM | Admit: 2022-05-03 | Discharge: 2022-05-03 | Disposition: A | Discharge status: Home / Self Care | Payer: No Typology Code available for payment source | Attending: Emergency Medicine | Admitting: Emergency Medicine

## 2022-05-03 ENCOUNTER — Emergency Department (HOSPITAL_COMMUNITY): Payer: No Typology Code available for payment source

## 2022-05-03 DIAGNOSIS — I10 Essential (primary) hypertension: Secondary | ICD-10-CM | POA: Insufficient documentation

## 2022-05-03 DIAGNOSIS — R0602 Shortness of breath: Secondary | ICD-10-CM | POA: Insufficient documentation

## 2022-05-03 DIAGNOSIS — R000 Tachycardia, unspecified: Secondary | ICD-10-CM | POA: Diagnosis not present

## 2022-05-03 DIAGNOSIS — M25511 Pain in right shoulder: Secondary | ICD-10-CM | POA: Diagnosis not present

## 2022-05-03 DIAGNOSIS — Z91010 Allergy to peanuts: Secondary | ICD-10-CM | POA: Diagnosis not present

## 2022-05-03 DIAGNOSIS — Z79899 Other long term (current) drug therapy: Secondary | ICD-10-CM | POA: Diagnosis not present

## 2022-05-03 DIAGNOSIS — E039 Hypothyroidism, unspecified: Secondary | ICD-10-CM | POA: Diagnosis not present

## 2022-05-03 DIAGNOSIS — R0789 Other chest pain: Secondary | ICD-10-CM | POA: Diagnosis present

## 2022-05-03 DIAGNOSIS — M549 Dorsalgia, unspecified: Secondary | ICD-10-CM | POA: Diagnosis not present

## 2022-05-03 LAB — CBC
HCT: 44.2 % (ref 36.0–46.0)
Hemoglobin: 15.2 g/dL — ABNORMAL HIGH (ref 12.0–15.0)
MCH: 32.6 pg (ref 26.0–34.0)
MCHC: 34.4 g/dL (ref 30.0–36.0)
MCV: 94.8 fL (ref 80.0–100.0)
Platelets: 408 10*3/uL — ABNORMAL HIGH (ref 150–400)
RBC: 4.66 MIL/uL (ref 3.87–5.11)
RDW: 12.5 % (ref 11.5–15.5)
WBC: 7.1 10*3/uL (ref 4.0–10.5)
nRBC: 0 % (ref 0.0–0.2)

## 2022-05-03 LAB — BASIC METABOLIC PANEL
Anion gap: 15 (ref 5–15)
BUN: 11 mg/dL (ref 6–20)
CO2: 22 mmol/L (ref 22–32)
Calcium: 9.9 mg/dL (ref 8.9–10.3)
Chloride: 102 mmol/L (ref 98–111)
Creatinine, Ser: 0.97 mg/dL (ref 0.44–1.00)
GFR, Estimated: >60 mL/min (ref >60)
Glucose, Bld: 128 mg/dL — ABNORMAL HIGH (ref 70–99)
Potassium: 4.1 mmol/L (ref 3.5–5.1)
Sodium: 139 mmol/L (ref 135–145)

## 2022-05-03 LAB — D-DIMER, QUANTITATIVE: D-Dimer, Quant: 0.29 ug/mL-FEU (ref 0.00–0.50)

## 2022-05-03 LAB — TROPONIN I (HIGH SENSITIVITY)
Troponin I (High Sensitivity): 5 ng/L (ref <18)
Troponin I (High Sensitivity): 3 ng/L (ref <18)

## 2022-05-03 LAB — I-STAT BETA HCG BLOOD, ED (MC, WL, AP ONLY): I-stat hCG, quantitative: <5 m[IU]/mL (ref <5)

## 2022-05-03 MED ORDER — OXYCODONE-ACETAMINOPHEN 5-325 MG PO TABS
1.0000 | ORAL_TABLET | Freq: Once | ORAL | Status: AC
  Administered 2022-05-03: 1 via ORAL
  Filled 2022-05-03: qty 1

## 2022-05-03 MED ORDER — KETOROLAC TROMETHAMINE 30 MG/ML IJ SOLN
30.0000 mg | Freq: Once | INTRAMUSCULAR | Status: AC
  Administered 2022-05-03: 30 mg via INTRAMUSCULAR
  Filled 2022-05-03: qty 1

## 2022-05-03 MED ORDER — NAPROXEN 500 MG PO TABS
500.0000 mg | ORAL_TABLET | Freq: Two times a day (BID) | ORAL | 0 refills | Status: AC
  Filled 2022-05-03: qty 8, 4d supply, fill #0

## 2022-05-03 MED ORDER — OXYCODONE-ACETAMINOPHEN 5-325 MG PO TABS
1.0000 | ORAL_TABLET | Freq: Four times a day (QID) | ORAL | 0 refills | Status: AC | PRN
  Filled 2022-05-03: qty 10, 3d supply, fill #0

## 2022-05-03 NOTE — ED Triage Notes (Signed)
Pt has had sharp stabbing chest pain radiating down right arm since yesterday. Pt states she has tingling in both arm. Pt has had nausea and shortness of breath.

## 2022-05-03 NOTE — ED Provider Notes (Signed)
[place] EMERGENCY DEPARTMENT Provider Note   CSN: [id number] Arrival date & time: 05/03/22  0935     History PMH: DDD, HLD, HTN, hypothyrodism Chief Complaint  Patient presents with   Chest Pain    Shannon Santiago is a 56 y.o. female. Pt complains of chest pain.  Patient states yesterday morning she started having some right shoulder aching that radiated into her shoulder blades.  She says this is continued to get worse and now she is having central chest pain.  She does have associated shortness of breath but she is not sure that this is because of how pain she is having.  She denies, nausea, vomiting, weakness, dizziness, abdominal pain.   Chest Pain Associated symptoms: back pain and shortness of breath        Home Medications Prior to Admission medications   Medication Sig Start Date End Date Taking? Authorizing Provider  naproxen (NAPROSYN) 500 MG tablet Take 1 tablet (500 mg total) by mouth 2 (two) times daily for 4 days. 05/03/22 05/07/22 Yes Korry Dalgleish, Adora Fridge, PA-C  oxyCODONE-acetaminophen (PERCOCET/ROXICET) 5-325 MG tablet Take 1 tablet by mouth every 6 (six) hours as needed for up to 5 days for severe pain. 05/03/22 05/08/22 Yes Linden Mikes, Adora Fridge, PA-C  Albuterol Sulfate (PROAIR RESPICLICK) 211 (90 Base) MCG/ACT AEPB Inhale 1-2 puffs into the lungs every 4 (four) hours as needed. 04/12/20   Emeterio Reeve, DO  ALPRAZolam Duanne Moron) 0.5 MG tablet Take 0.5-1 tablets (0.25-0.5 mg total) by mouth 2 (two) times daily as needed for anxiety. 11/24/19   Emeterio Reeve, DO  amLODipine (NORVASC) 5 MG tablet TAKE 1 TABLET BY MOUTH ONCE DAILY 10/24/21 10/24/22  Samuel Bouche, NP  Ascorbic Acid (VITAMIN C) 1000 MG tablet Take 1,000 mg by mouth daily.    [provider]  atorvastatin (LIPITOR) 40 MG tablet Take 1 tablet (40 mg total) by mouth daily. 03/29/22 07/01/22  Tobb, Godfrey Pick, DO  b complex vitamins tablet Take 1 tablet by mouth daily.    [provider]  busPIRone (BUSPAR) 15 MG tablet Take 1 tablet (15 mg total) by mouth 2 (two) times daily. Delete prior refill 04/01/22 04/01/23  [name], MD  CALCIUM PO Take 500 mg by mouth daily.    [provider]  cetirizine (ZYRTEC) 10 MG tablet Take 10 mg by mouth daily. 07/05/20   [provider]  Cholecalciferol (VITAMIN D3) 10 MCG (400 UNIT) tablet Take 400 Units by mouth daily.    [provider]  DULoxetine (CYMBALTA) 60 MG capsule Take 1 capsule (60 mg total) by mouth 2 (two) times daily. 04/25/22 04/25/23  [name], MD  EPINEPHrine 0.3 mg/0.3 mL IJ SOAJ injection Inject 0.3 mLs (0.3 mg total) into the muscle once as needed (anaphylaxis/allergic reaction). 05/06/19   Trixie Dredge, PA-C  Ferrous Sulfate (IRON PO) Take 325 mg by mouth daily.    [provider]  fluticasone (FLONASE) 50 MCG/ACT nasal spray Place 1 spray into both nostrils daily. 10/19/20   Martyn Ehrich, NP  Ginger, Zingiber officinalis, (GINGER ROOT PO) Take by mouth.    [provider]  levothyroxine (SYNTHROID) 75 MCG tablet TAKE 1 TABLET BY MOUTH DAILY BEFORE BREAKFAST 10/24/21 10/24/22  Samuel Bouche, NP  lisinopril (ZESTRIL) 20 MG tablet TAKE 1 TABLET BY MOUTH ONCE DAILY 10/24/21 10/24/22  Samuel Bouche, NP  Magnesium 500 MG CAPS Take 500 mg by mouth daily.    [provider]  Multiple Vitamin (MULTIVITAMIN)  capsule Take 1 capsule by mouth daily.    [provider]  Omega-3 Fatty Acids (FISH OIL) 1000 MG CAPS Take 1 capsule by mouth 2 (two) times daily.    [provider]  polyethylene glycol (MIRALAX) 17 g packet Take the Miralax 17 G twice daily until Large BM then once daily. 10/02/21   Jackquline Denmark, MD  Probiotic Product (PROBIOTIC ADVANCED PO) Take 1 capsule by mouth daily.    [provider]  RESTASIS 0.05 % ophthalmic emulsion 1 drop 2 (two) times daily. Patient not taking: Reported on 09/19/2021 06/08/20   [provider]  traZODone (DESYREL) 50 MG tablet Take 1 tablet (50 mg total) by mouth at bedtime. 04/01/22   [name], MD      Allergies    Morphine and related, Peanut oil, Penicillins, Polymyxin b-trimethoprim, Abilify [aripiprazole], Lurasidone hcl, Citalopram, Food, Prednisone, and Singulair [montelukast sodium]    Review of Systems   Review of Systems  Respiratory:  Positive for shortness of breath.   Cardiovascular:  Positive for chest pain.  Musculoskeletal:  Positive for back pain.  All other systems reviewed and are negative.   Physical Exam Updated Vital Signs BP (!) 123/91   Pulse 81   Temp 97.7 F (36.5 C) (Oral)   Resp 12   Ht '5\' 1"'$  (1.549 m)   Wt 77.1 kg   LMP 06/18/2017   SpO2 98%   BMI 32.12 kg/m  Physical Exam Vitals and nursing note reviewed.  Constitutional:      General: She is not in acute distress.    Appearance: Normal appearance. She is not ill-appearing, toxic-appearing or diaphoretic.  HENT:     Head: Normocephalic and atraumatic.     Nose: No nasal deformity.     Mouth/Throat:     Lips: Pink. No lesions.     Mouth: Mucous membranes are moist. No injury, lacerations, oral lesions or angioedema.     Pharynx: Oropharynx is clear. Uvula midline. No pharyngeal swelling, oropharyngeal exudate, posterior oropharyngeal erythema or uvula swelling.  Eyes:     General: Gaze aligned appropriately. No scleral icterus.       Right eye: No discharge.        Left eye: No discharge.     Conjunctiva/sclera: Conjunctivae normal.     Right eye: Right conjunctiva is not injected. No exudate or hemorrhage.    Left eye: Left conjunctiva is not injected. No exudate or hemorrhage.    Pupils: Pupils are equal, round, and reactive to light.  Cardiovascular:     Rate and Rhythm: Normal rate and regular rhythm.     Pulses: Normal pulses.          Radial pulses are 2+ on the right side and 2+ on the left side.       Dorsalis pedis pulses are 2+ on the right side and 2+ on  the left side.     Heart sounds: Normal heart sounds, S1 normal and S2 normal. Heart sounds not distant. No murmur heard.    No friction rub. No gallop. No S3 or S4 sounds.  Pulmonary:     Effort: Pulmonary effort is normal. No accessory muscle usage or respiratory distress.     Breath sounds: Normal breath sounds. No stridor. No wheezing, rhonchi or rales.  Chest:     Chest wall: Tenderness present.  Abdominal:     General: Abdomen is flat. Bowel sounds are normal. There is no distension.  Palpations: Abdomen is soft. There is no mass or pulsatile mass.     Tenderness: There is no abdominal tenderness. There is no guarding or rebound.  Musculoskeletal:     Right lower leg: No edema.     Left lower leg: No edema.     Comments: Reproducible right shoulder pain, posterior back pain  Skin:    General: Skin is warm and dry.     Coloration: Skin is not jaundiced or pale.     Findings: No bruising, erythema, lesion or rash.  Neurological:     General: No focal deficit present.     Mental Status: She is alert and oriented to person, place, and time.     GCS: GCS eye subscore is 4. GCS verbal subscore is 5. GCS motor subscore is 6.  Psychiatric:        Mood and Affect: Mood normal.        Behavior: Behavior normal. Behavior is cooperative.     ED Results / Procedures / Treatments   Labs (all labs ordered are listed, but only abnormal results are displayed) Labs Reviewed  BASIC METABOLIC PANEL - Abnormal; Notable for the following components:      Result Value   Glucose, Bld 128 (*)    All other components within normal limits  CBC - Abnormal; Notable for the following components:   Hemoglobin 15.2 (*)    Platelets 408 (*)    All other components within normal limits  D-DIMER, QUANTITATIVE  I-STAT BETA HCG BLOOD, ED ([facility], [facility], [facility] ONLY)  TROPONIN I (HIGH SENSITIVITY)  TROPONIN I (HIGH SENSITIVITY)    EKG EKG Interpretation  Date/Time:  Friday May 03 2022 10:12:35  EDT Ventricular Rate:  109 PR Interval:  156 QRS Duration: 68 QT Interval:  334 QTC Calculation: 449 R Axis:   -26 Text Interpretation: Sinus tachycardia with Fusion complexes Possible Left atrial enlargement Inferior infarct , age undetermined Anterior infarct , age undetermined Abnormal ECG When compared with ECG of 10-Nov-2020 10:03, increased rate and nonspecific ST changes Confirmed by Aletta Edouard [phone]) on 05/03/2022 1:13:27 PM  Radiology DG Chest 2 View  Result Date: 05/03/2022 CLINICAL DATA:  Chest pain and chills today EXAM: CHEST - 2 VIEW COMPARISON:  07/04/2020 FINDINGS: Normal heart size, mediastinal contours, and pulmonary vascularity. Atherosclerotic calcification aorta. Lungs clear. No acute infiltrate, pleural effusion, or pneumothorax. Thoracolumbar scoliosis. IMPRESSION: No acute abnormalities. Aortic Atherosclerosis (ICD10-I70.0). Electronically Signed   By: Lavonia Dana M.D.   On: 05/03/2022 10:37    Procedures Procedures  This patient was on telemetry or cardiac monitoring during their time in the ED.    Medications Ordered in ED Medications  oxyCODONE-acetaminophen (PERCOCET/ROXICET) 5-325 MG per tablet 1 tablet (1 tablet Oral Given 05/03/22 1352)  ketorolac (TORADOL) 30 MG/ML injection 30 mg (30 mg Intramuscular Given 05/03/22 1450)    ED Course/ Medical Decision Making/ A&P Clinical Course as of 05/03/22 1659  Fri May 03, 2022  1355 Dispo pending d dimer and pain control [GL]    Clinical Course User Index [GL] Adolphus Birchwood, PA-C                           Medical Decision Making Amount and/or Complexity of Data Reviewed Labs: ordered. Radiology: ordered.  Risk Prescription drug management.    MDM  This is a 56 y.o. female who presents to the ED with chest pain and shoulder pain The differential of this  patient includes but is not limited to ACS, PE, dissection, MSK, etc  Initial Impression  Well appearing, vitals stable, initially slightly  tachycardic.  Pain reproducible which is reassuring.  Cardiac workup initiated.  I personally ordered, reviewed, and interpreted all laboratory work and imaging and agree with radiologist interpretation. Results interpreted below: CBC with out acute abnormality, BMP normal, Troponin negative x 2, d dimer negative, not pregant CXR without acute finding EKG with ST   Assessment/Plan:  Workup is reassuring. D dimer negative so no further workup indicated for PE. I do not think this is a dissection as equal pulses, normal motor and sensory, and pain is reproducible. This is likely MSK in etiology.  Pain is improved after Percocet and Toradol. Recommend NSAIDs, supportive tx, and ortho f/u No admission needs. Stable for discharge.   Charting Requirements Additional history is obtained from:  Independent historian External Records from outside source obtained and reviewed including: prior EKG, labs Social Determinants of Health:  none Pertinant PMH that complicates patient's illness: n/a  Patient Care Problems that were addressed during this visit: - Atypical Chest Pain: Acute illness with systemic symptoms - Right Shoulder Pain: Acute illness This patient was maintained on a cardiac monitor/telemetry. I personally viewed and interpreted the cardiac monitor which reveals an underlying rhythm of NSR Medications given in ED: Toradol, Percocet Reevaluation of the patient after these medicines showed that the patient improved I have reviewed home medications and made changes accordingly.  Critical Care Interventions: n/a Consultations: n/a Disposition: discharge  Portions of this note were generated with Dragon dictation software. Dictation errors may occur despite best attempts at proofreading.         Final Clinical Impression(s) / ED Diagnoses Final diagnoses:  Atypical chest pain  Acute pain of right shoulder    Rx / DC Orders ED Discharge Orders          Ordered     oxyCODONE-acetaminophen (PERCOCET/ROXICET) 5-325 MG tablet  Every 6 hours PRN        05/03/22 1508    naproxen (NAPROSYN) 500 MG tablet  2 times daily        05/03/22 1508              Adolphus Birchwood, PA-C 05/03/22 1659    Hayden Rasmussen, MD 05/03/22 1916

## 2022-05-03 NOTE — ED Provider Triage Note (Signed)
Emergency Medicine Provider Triage Evaluation Note  Shannon Santiago , a 56 y.o. female  was evaluated in triage.  Pt complains of chest pain.  Patient states yesterday morning she started having some right shoulder aching that radiated into her shoulder blades.  She says this is continued to get worse and now she is having central chest pain.  She does have associated shortness of breath but she is not sure that this is because of how pain she is having.  She denies, nausea, vomiting, weakness, dizziness, abdominal pain.  Review of Systems  Positive: Chest pain, back pain, shoulder pain Negative:   Physical Exam  BP (!) 139/93 (BP Location: Left Arm)   Pulse (!) 104   Temp 97.7 F (36.5 C) (Oral)   Resp 18   Ht '5\' 1"'$  (1.549 m)   Wt 77.1 kg   LMP 06/18/2017   SpO2 100%   BMI 32.12 kg/m  Gen:   Awake, no distress   Resp:  Somewhat labored breathing MSK:   Moves extremities without difficulty  Other:   Right shoulder pain is reproducible.  Lungs CTA Pulses 2+ bilat Heart RRR. No murmurs  Medical Decision Making  Medically screening exam initiated at 10:16 AM.  Appropriate orders placed.  [name] was informed that the remainder of the evaluation will be completed by another provider, this initial triage assessment does not replace that evaluation, and the importance of remaining in the ED until their evaluation is complete.  =   Adolphus Birchwood, PA-C 05/03/22 1018

## 2022-05-03 NOTE — Discharge Instructions (Signed)
Please take Naproxen as prescribed for four days. I have also prescribed you as needed Percocet for breakthrough pain. Please do not take this medication prior to driving as it may make you sleepy.   Please call and schedule a follow up appointment with [facility].  You should also follow up with your PCP if unable to see Orthopedics next week.

## 2022-05-07 ENCOUNTER — Other Ambulatory Visit (HOSPITAL_BASED_OUTPATIENT_CLINIC_OR_DEPARTMENT_OTHER): Payer: Self-pay

## 2022-05-13 ENCOUNTER — Other Ambulatory Visit (HOSPITAL_BASED_OUTPATIENT_CLINIC_OR_DEPARTMENT_OTHER): Payer: Self-pay

## 2022-05-13 DIAGNOSIS — M542 Cervicalgia: Secondary | ICD-10-CM | POA: Insufficient documentation

## 2022-05-15 ENCOUNTER — Ambulatory Visit (INDEPENDENT_AMBULATORY_CARE_PROVIDER_SITE_OTHER): Payer: No Typology Code available for payment source | Admitting: Medical-Surgical

## 2022-05-15 ENCOUNTER — Other Ambulatory Visit (HOSPITAL_BASED_OUTPATIENT_CLINIC_OR_DEPARTMENT_OTHER): Payer: Self-pay

## 2022-05-15 ENCOUNTER — Encounter: Payer: Self-pay | Admitting: Medical-Surgical

## 2022-05-15 VITALS — BP 113/80 | HR 81 | Resp 20 | Ht 61.0 in | Wt 175.1 lb

## 2022-05-15 DIAGNOSIS — Z87892 Personal history of anaphylaxis: Secondary | ICD-10-CM | POA: Diagnosis not present

## 2022-05-15 DIAGNOSIS — I10 Essential (primary) hypertension: Secondary | ICD-10-CM | POA: Diagnosis not present

## 2022-05-15 DIAGNOSIS — E063 Autoimmune thyroiditis: Secondary | ICD-10-CM

## 2022-05-15 MED ORDER — AMLODIPINE BESYLATE 5 MG PO TABS
ORAL_TABLET | Freq: Every day | ORAL | 1 refills | Status: DC
  Filled 2022-05-15: qty 90, 90d supply, fill #0
  Filled 2022-08-12: qty 90, 90d supply, fill #1

## 2022-05-15 MED ORDER — LISINOPRIL 20 MG PO TABS
ORAL_TABLET | Freq: Every day | ORAL | 1 refills | Status: DC
  Filled 2022-05-15: qty 90, 90d supply, fill #0
  Filled 2022-08-12: qty 90, 90d supply, fill #1

## 2022-05-15 MED ORDER — LEVOTHYROXINE SODIUM 75 MCG PO TABS
ORAL_TABLET | Freq: Every day | ORAL | 1 refills | Status: DC
  Filled 2022-05-15: qty 90, 90d supply, fill #0
  Filled 2022-08-12: qty 90, 90d supply, fill #1

## 2022-05-15 MED ORDER — NAPROXEN 500 MG PO TABS
ORAL_TABLET | ORAL | 0 refills | Status: DC
  Filled 2022-05-15: qty 60, 30d supply, fill #0

## 2022-05-15 MED ORDER — EPINEPHRINE 0.3 MG/0.3ML IJ SOAJ
0.3000 mg | Freq: Once | INTRAMUSCULAR | 3 refills | Status: DC | PRN
  Filled 2022-05-15: qty 2, 2d supply, fill #0

## 2022-05-15 NOTE — Progress Notes (Signed)
Established Patient Office Visit  Subjective   Patient ID: Shannon Santiago, female   DOB: 25-Sep-1966 Age: 56 y.o. MRN: [id number]   Chief Complaint  Patient presents with   Follow-up   Hypertension   HPI Very pleasant 56 year old female presenting today for follow-up on hypertension.  Was recently evaluated for atypical chest pain and shortness of breath at the ED on 05/03/2022.  Her etiology was determined to be musculoskeletal at that time.  Over the last 6 months, she has had some blood pressures that were borderline elevated as well as a few that were systolically normal but diastolically high.  She is currently taking lisinopril 20 mg daily and amlodipine 5 mg daily. Not checking BP at home. Follows a low sodium diet. Exercises sporadically, around a couple of times per week. Denies SOB, palpitations, lower extremity edema, dizziness, headaches, or vision changes.    Objective:    Vitals:   05/15/22 1127  BP: 113/80  Pulse: 81  Resp: 20  Height: '5\' 1"'$  (1.549 m)  Weight: 175 lb 1.9 oz (79.4 kg)  SpO2: 96%  BMI (Calculated): 33.11   Physical Exam Vitals and nursing note reviewed.  Constitutional:      General: She is not in acute distress.    Appearance: Normal appearance. She is not ill-appearing.  HENT:     Head: Normocephalic and atraumatic.  Cardiovascular:     Rate and Rhythm: Normal rate and regular rhythm.     Pulses: Normal pulses.     Heart sounds: Normal heart sounds.  Pulmonary:     Effort: Pulmonary effort is normal. No respiratory distress.     Breath sounds: Normal breath sounds. No wheezing, rhonchi or rales.  Skin:    General: Skin is warm and dry.  Neurological:     Mental Status: She is alert and oriented to person, place, and time.  Psychiatric:        Mood and Affect: Mood normal.        Behavior: Behavior normal.        Thought Content: Thought content normal.        Judgment: Judgment normal.   No results found for this or any previous visit  (from the past 24 hour(s)).     The 10-year ASCVD risk score (Arnett DK, et al., 2019) is: 2.7%   Values used to calculate the score:     Age: 39 years     Sex: Female     Is Non-Hispanic African American: No     Diabetic: No     Tobacco smoker: No     Systolic Blood Pressure: 951 mmHg     Is BP treated: Yes     HDL Cholesterol: 40 mg/dL     Total Cholesterol: 174 mg/dL   Assessment & Plan:   1. Primary hypertension 2. Hypertension goal BP (blood pressure) < 130/80 Blood pressure looks great today 113/80.  Recent labs checked with no concerning findings.  Advised to continue low-sodium diet and work on increasing regular intentional exercise with a goal for weight loss to healthy weight.  Also recommend checking blood pressure a couple times a week to make sure she is staying in a good range. - amLODipine (NORVASC) 5 MG tablet; TAKE 1 TABLET BY MOUTH ONCE DAILY  Dispense: 90 tablet; Refill: 1 - lisinopril (ZESTRIL) 20 MG tablet; TAKE 1 TABLET BY MOUTH ONCE DAILY  Dispense: 90 tablet; Refill: 1 - Lipid panel - TSH  2. History of  anaphylaxis Has not needed her EpiPen so it is expired.  Sending in a refill to get her a new one on hand. - EPINEPHrine 0.3 mg/0.3 mL IJ SOAJ injection; Inject 0.3 mg into the muscle once as needed (anaphylaxis/allergic reaction).  Dispense: 2 each; Refill: 3  4. Hashimoto's thyroiditis Checking TSH today.  Continue levothyroxine 75 mcg daily but may need to titrate depending on results. - levothyroxine (SYNTHROID) 75 MCG tablet; TAKE 1 TABLET BY MOUTH DAILY BEFORE BREAKFAST  Dispense: 90 tablet; Refill: 1 - TSH  Return in about 6 months (around 11/15/2022) for HTN follow up.  ___________________________________________ Clearnce Sorrel, DNP, APRN, FNP-BC [facility] [place]

## 2022-05-16 LAB — LIPID PANEL
Cholesterol: 183 mg/dL (ref <200)
HDL: 49 mg/dL — ABNORMAL LOW (ref >=50)
LDL Cholesterol (Calc): 112 mg/dL (calc) — ABNORMAL HIGH
Non-HDL Cholesterol (Calc): 134 mg/dL (calc) — ABNORMAL HIGH (ref <130)
Total CHOL/HDL Ratio: 3.7 (calc) (ref <5.0)
Triglycerides: 114 mg/dL (ref <150)

## 2022-05-16 LAB — TSH: TSH: 1.24 mIU/L (ref 0.40–4.50)

## 2022-05-17 NOTE — Progress Notes (Signed)
She is welcome to take two of the '40mg'$  tablets daily to equal the '80mg'$  dose until her supply is exhausted. I can go ahead and send the higher dose. With the change in mg, they should have no problem covering it.

## 2022-05-23 ENCOUNTER — Other Ambulatory Visit (HOSPITAL_BASED_OUTPATIENT_CLINIC_OR_DEPARTMENT_OTHER): Payer: Self-pay

## 2022-05-24 ENCOUNTER — Other Ambulatory Visit (HOSPITAL_BASED_OUTPATIENT_CLINIC_OR_DEPARTMENT_OTHER): Payer: Self-pay

## 2022-05-27 ENCOUNTER — Other Ambulatory Visit (HOSPITAL_BASED_OUTPATIENT_CLINIC_OR_DEPARTMENT_OTHER): Payer: Self-pay

## 2022-05-28 ENCOUNTER — Other Ambulatory Visit (HOSPITAL_BASED_OUTPATIENT_CLINIC_OR_DEPARTMENT_OTHER): Payer: Self-pay

## 2022-05-31 DIAGNOSIS — M5412 Radiculopathy, cervical region: Secondary | ICD-10-CM | POA: Insufficient documentation

## 2022-06-24 ENCOUNTER — Other Ambulatory Visit (HOSPITAL_COMMUNITY): Payer: Self-pay | Admitting: Psychiatry

## 2022-06-24 ENCOUNTER — Other Ambulatory Visit: Payer: Self-pay | Admitting: Cardiology

## 2022-06-25 ENCOUNTER — Other Ambulatory Visit (HOSPITAL_BASED_OUTPATIENT_CLINIC_OR_DEPARTMENT_OTHER): Payer: Self-pay

## 2022-06-25 MED ORDER — ATORVASTATIN CALCIUM 40 MG PO TABS
40.0000 mg | ORAL_TABLET | Freq: Every day | ORAL | 0 refills | Status: DC
  Filled 2022-06-25: qty 30, 30d supply, fill #0

## 2022-06-25 MED ORDER — DULOXETINE HCL 60 MG PO CPEP
60.0000 mg | ORAL_CAPSULE | Freq: Two times a day (BID) | ORAL | 1 refills | Status: DC
Start: 2022-06-25 — End: 2022-09-17
  Filled 2022-06-25: qty 60, 30d supply, fill #0
  Filled 2022-08-12: qty 60, 30d supply, fill #1

## 2022-07-01 ENCOUNTER — Other Ambulatory Visit (HOSPITAL_BASED_OUTPATIENT_CLINIC_OR_DEPARTMENT_OTHER): Payer: Self-pay

## 2022-07-04 ENCOUNTER — Encounter: Payer: Self-pay | Admitting: Medical-Surgical

## 2022-07-04 ENCOUNTER — Ambulatory Visit (INDEPENDENT_AMBULATORY_CARE_PROVIDER_SITE_OTHER): Payer: No Typology Code available for payment source | Admitting: Medical-Surgical

## 2022-07-04 VITALS — BP 108/73 | HR 86 | Resp 20 | Ht 61.0 in | Wt 177.3 lb

## 2022-07-04 DIAGNOSIS — I10 Essential (primary) hypertension: Secondary | ICD-10-CM | POA: Diagnosis not present

## 2022-07-04 DIAGNOSIS — Z0000 Encounter for general adult medical examination without abnormal findings: Secondary | ICD-10-CM | POA: Diagnosis not present

## 2022-07-04 NOTE — Progress Notes (Signed)
See student note and attestation below. ___________________________________________ Clearnce Sorrel, DNP, APRN, FNP-BC [facility] [place]

## 2022-07-04 NOTE — Progress Notes (Signed)
Complete physical exam  Patient: Shannon Santiago   DOB: Oct 12, 1966   56 y.o. Female  MRN: [id number]  Subjective:    Chief Complaint  Patient presents with   Annual Exam    Shannon Santiago is a 56 y.o. female who presents today for a complete physical exam. She reports consuming a general and low sodium diet. Home exercise routine includes strength exercises, stationary bike. Gym/ health club routine includes light weights. Exercise is limited by recently diagnosed neck pain. She generally feels fairly well. She reports sleeping fairly well. She does not have additional problems to discuss today.    Most recent fall risk assessment:    05/15/2022   11:51 AM  [place] in the past year? 0  Number falls in past yr: 0  Injury with Fall? 0  Risk for fall due to : No Fall Risks  Follow up Falls evaluation completed     Most recent depression screenings:    05/15/2022   11:51 AM 08/17/2021    2:47 PM  PHQ 2/9 Scores  PHQ - 2 Score 4 2  PHQ- 9 Score 18 7    Vision:Not within last year  and Dental: No current dental problems      Patient Care Team: Samuel Bouche, NP as PCP - General (Nurse Practitioner) Berniece Salines, DO as PCP - Cardiology (Cardiology)   Outpatient Medications Prior to Visit  Medication Sig   Albuterol Sulfate (PROAIR RESPICLICK) 976 (90 Base) MCG/ACT AEPB Inhale 1-2 puffs into the lungs every 4 (four) hours as needed.   ALPRAZolam (XANAX) 0.5 MG tablet Take 0.5-1 tablets (0.25-0.5 mg total) by mouth 2 (two) times daily as needed for anxiety.   amLODipine (NORVASC) 5 MG tablet TAKE 1 TABLET BY MOUTH ONCE DAILY   Ascorbic Acid (VITAMIN C) 1000 MG tablet Take 1,000 mg by mouth daily.   atorvastatin (LIPITOR) 40 MG tablet Take 1 tablet (40 mg total) by mouth daily. Needs appointment for future refills / 2nd attempt   b complex vitamins tablet Take 1 tablet by mouth daily.   busPIRone (BUSPAR) 15 MG tablet Take 1 tablet (15 mg total) by mouth 2 (two)  times daily. Delete prior refill   CALCIUM PO Take 500 mg by mouth daily.   cetirizine (ZYRTEC) 10 MG tablet Take 10 mg by mouth daily.   Cholecalciferol (VITAMIN D3) 10 MCG (400 UNIT) tablet Take 400 Units by mouth daily.   DULoxetine (CYMBALTA) 60 MG capsule Take 1 capsule (60 mg total) by mouth 2 (two) times daily.   EPINEPHrine 0.3 mg/0.3 mL IJ SOAJ injection Inject 0.3 mg into the muscle once as needed (anaphylaxis/allergic reaction).   Ferrous Sulfate (IRON PO) Take 325 mg by mouth daily.   fluticasone (FLONASE) 50 MCG/ACT nasal spray Place 1 spray into both nostrils daily.   Ginger, Zingiber officinalis, (GINGER ROOT PO) Take by mouth.   levothyroxine (SYNTHROID) 75 MCG tablet TAKE 1 TABLET BY MOUTH DAILY BEFORE BREAKFAST   lisinopril (ZESTRIL) 20 MG tablet TAKE 1 TABLET BY MOUTH ONCE DAILY   Magnesium 500 MG CAPS Take 500 mg by mouth daily.   Multiple Vitamin (MULTIVITAMIN) capsule Take 1 capsule by mouth daily.   naproxen (NAPROSYN) 500 MG tablet Take 1 tablet (500 mg) by mouth twice a day for 30 days.   Omega-3 Fatty Acids (FISH OIL) 1000 MG CAPS Take 1 capsule by mouth 2 (two) times daily.   polyethylene glycol (MIRALAX) 17 g packet Take  the Miralax 17 G twice daily until Large BM then once daily.   Probiotic Product (PROBIOTIC ADVANCED PO) Take 1 capsule by mouth daily.   traZODone (DESYREL) 50 MG tablet Take 1 tablet (50 mg total) by mouth at bedtime.   No facility-administered medications prior to visit.    Review of Systems  Constitutional:  Negative for chills, diaphoresis, fever, malaise/fatigue and weight loss.  HENT:  Negative for congestion, ear discharge, ear pain, hearing loss, nosebleeds, sinus pain, sore throat and tinnitus.   Eyes:  Negative for blurred vision, double vision, photophobia, pain, discharge and redness.  Respiratory:  Negative for cough, hemoptysis, sputum production, shortness of breath, wheezing and stridor.   Cardiovascular:  Negative for chest  pain, palpitations, orthopnea, claudication, leg swelling and PND.  Gastrointestinal:  Negative for abdominal pain, blood in stool, constipation, diarrhea, heartburn, melena, nausea and vomiting.  Genitourinary:  Negative for flank pain, frequency, hematuria and urgency.  Musculoskeletal:  Positive for neck pain. Negative for back pain, falls, joint pain and myalgias.  Skin:  Negative for itching and rash.  Neurological:  Negative for dizziness, tingling, tremors, sensory change, speech change, focal weakness, seizures, loss of consciousness, weakness and headaches.  Psychiatric/Behavioral:  Positive for depression. Negative for hallucinations, memory loss, substance abuse and suicidal ideas. The patient does not have insomnia.           Objective:     BP 108/73 (BP Location: Right Arm, Cuff Size: Normal)   Pulse 86   Resp 20   Ht '5\' 1"'$  (1.549 m)   Wt 80.4 kg   LMP 06/18/2017   SpO2 96%   BMI 33.50 kg/m    Physical Exam Constitutional:      Appearance: Normal appearance.  HENT:     Head: Normocephalic and atraumatic.     Right Ear: Tympanic membrane normal.     Left Ear: Tympanic membrane normal.     Nose: Nose normal.     Mouth/Throat:     Mouth: Mucous membranes are moist.  Eyes:     Pupils: Pupils are equal, round, and reactive to light.  Cardiovascular:     Rate and Rhythm: Normal rate and regular rhythm.     Pulses: Normal pulses.     Heart sounds: Normal heart sounds.  Pulmonary:     Effort: Pulmonary effort is normal. No respiratory distress.     Breath sounds: Normal breath sounds. No stridor. No wheezing, rhonchi or rales.  Chest:     Chest wall: No tenderness.  Abdominal:     General: Bowel sounds are normal.     Palpations: Abdomen is soft.     Hernia: A hernia is present.  Musculoskeletal:        General: Normal range of motion.     Cervical back: Normal range of motion and neck supple.  Skin:    General: Skin is warm and dry.     Capillary Refill:  Capillary refill takes less than 2 seconds.  Neurological:     General: No focal deficit present.     Mental Status: She is alert and oriented to person, place, and time. Mental status is at baseline.     Cranial Nerves: No cranial nerve deficit.     Sensory: No sensory deficit.     Motor: No weakness.     Coordination: Coordination normal.     Gait: Gait normal.  Psychiatric:        Mood and Affect: Mood normal.  Behavior: Behavior normal.        Thought Content: Thought content normal.        Judgment: Judgment normal.      No results found for any visits on 07/04/22.     Assessment & Plan:    Routine Health Maintenance and Physical Exam  Immunization History  Administered Date(s) Administered   Influenza-Unspecified 08/01/2020, 08/16/2021   PFIZER(Purple Top)SARS-COV-2 Vaccination 11/10/2019, 12/01/2019   Pneumococcal Polysaccharide-23 12/10/2013   Tdap 05/05/2015   Zoster Recombinat (Shingrix) 05/06/2019, 04/16/2021    Health Maintenance  Topic Date Due   COVID-19 Vaccine (3 - Pfizer series) 07/20/2022 (Originally 01/26/2020)   INFLUENZA VACCINE  01/19/2023 (Originally 05/21/2022)   MAMMOGRAM  04/27/2023   PAP SMEAR-Modifier  06/17/2024   COLONOSCOPY (Pts 45-60yr Insurance coverage will need to be confirmed)  01/15/2025   TETANUS/TDAP  05/04/2025   Hepatitis C Screening  Completed   HIV Screening  Completed   Zoster Vaccines- Shingrix  Completed   HPV VACCINES  Aged Out    Discussed health benefits of physical activity, and encouraged her to engage in regular exercise appropriate for her age and condition.  Problem List Items Addressed This Visit   None Visit Diagnoses     Annual physical exam    -  Primary   Relevant Orders   CBC with Differential/Platelet   COMPLETE METABOLIC PANEL WITH GFR   Lipid panel      No follow-ups on file.     LRalph Leyden RN

## 2022-07-05 ENCOUNTER — Encounter: Payer: Self-pay | Admitting: Medical-Surgical

## 2022-07-05 LAB — CBC WITH DIFFERENTIAL/PLATELET
Absolute Monocytes: 698 cells/uL (ref 200–950)
Basophils Absolute: 60 cells/uL (ref 0–200)
Basophils Relative: 0.8 %
Eosinophils Absolute: 270 cells/uL (ref 15–500)
Eosinophils Relative: 3.6 %
HCT: 40.6 % (ref 35.0–45.0)
Hemoglobin: 13.9 g/dL (ref 11.7–15.5)
Lymphs Abs: 2505 cells/uL (ref 850–3900)
MCH: 33.4 pg — ABNORMAL HIGH (ref 27.0–33.0)
MCHC: 34.2 g/dL (ref 32.0–36.0)
MCV: 97.6 fL (ref 80.0–100.0)
MPV: 9.7 fL (ref 7.5–12.5)
Monocytes Relative: 9.3 %
Neutro Abs: 3968 cells/uL (ref 1500–7800)
Neutrophils Relative %: 52.9 %
Platelets: 413 10*3/uL — ABNORMAL HIGH (ref 140–400)
RBC: 4.16 10*6/uL (ref 3.80–5.10)
RDW: 12.1 % (ref 11.0–15.0)
Total Lymphocyte: 33.4 %
WBC: 7.5 10*3/uL (ref 3.8–10.8)

## 2022-07-05 LAB — COMPLETE METABOLIC PANEL WITH GFR
AG Ratio: 1.6 (calc) (ref 1.0–2.5)
ALT: 27 U/L (ref 6–29)
AST: 18 U/L (ref 10–35)
Albumin: 4.5 g/dL (ref 3.6–5.1)
Alkaline phosphatase (APISO): 80 U/L (ref 37–153)
BUN: 20 mg/dL (ref 7–25)
CO2: 25 mmol/L (ref 20–32)
Calcium: 9.7 mg/dL (ref 8.6–10.4)
Chloride: 103 mmol/L (ref 98–110)
Creat: 0.83 mg/dL (ref 0.50–1.03)
Globulin: 2.8 g/dL (calc) (ref 1.9–3.7)
Glucose, Bld: 88 mg/dL (ref 65–99)
Potassium: 4.3 mmol/L (ref 3.5–5.3)
Sodium: 139 mmol/L (ref 135–146)
Total Bilirubin: 0.6 mg/dL (ref 0.2–1.2)
Total Protein: 7.3 g/dL (ref 6.1–8.1)
eGFR: 83 mL/min/{1.73_m2} (ref >=60)

## 2022-07-05 LAB — LIPID PANEL
Cholesterol: 202 mg/dL — ABNORMAL HIGH (ref <200)
HDL: 49 mg/dL — ABNORMAL LOW (ref >=50)
LDL Cholesterol (Calc): 125 mg/dL (calc) — ABNORMAL HIGH
Non-HDL Cholesterol (Calc): 153 mg/dL (calc) — ABNORMAL HIGH (ref <130)
Total CHOL/HDL Ratio: 4.1 (calc) (ref <5.0)
Triglycerides: 164 mg/dL — ABNORMAL HIGH (ref <150)

## 2022-07-19 ENCOUNTER — Other Ambulatory Visit (HOSPITAL_COMMUNITY): Payer: Self-pay | Admitting: Psychiatry

## 2022-07-19 ENCOUNTER — Encounter (HOSPITAL_COMMUNITY): Payer: Self-pay | Admitting: Psychiatry

## 2022-07-19 ENCOUNTER — Telehealth (INDEPENDENT_AMBULATORY_CARE_PROVIDER_SITE_OTHER): Payer: No Typology Code available for payment source | Admitting: Psychiatry

## 2022-07-19 ENCOUNTER — Other Ambulatory Visit (HOSPITAL_BASED_OUTPATIENT_CLINIC_OR_DEPARTMENT_OTHER): Payer: Self-pay

## 2022-07-19 DIAGNOSIS — F333 Major depressive disorder, recurrent, severe with psychotic symptoms: Secondary | ICD-10-CM

## 2022-07-19 DIAGNOSIS — F5102 Adjustment insomnia: Secondary | ICD-10-CM

## 2022-07-19 DIAGNOSIS — F411 Generalized anxiety disorder: Secondary | ICD-10-CM

## 2022-07-19 MED ORDER — BUSPIRONE HCL 15 MG PO TABS
15.0000 mg | ORAL_TABLET | Freq: Two times a day (BID) | ORAL | 0 refills | Status: DC
  Filled 2022-07-19: qty 180, 90d supply, fill #0

## 2022-07-19 NOTE — Progress Notes (Signed)
[facility] Follow up visit   Patient Identification: Shannon Santiago MRN:  [id number] Date of Evaluation:  07/19/2022 Referral Source: [facility] discharge Chief Complaint:  follow up depression, grief Visit Diagnosis:    ICD-10-CM   1. MDD (major depressive disorder), recurrent, severe, with psychosis ([place])  F33.3     2. GAD (generalized anxiety disorder)  F41.1     3. Adjustment insomnia  F51.02       Virtual Visit via Video Note  I connected with Shannon Santiago on 07/19/22 at 12:00 PM EDT by a video enabled telemedicine application and verified that I am speaking with the correct person using two identifiers.  Location: Patient: home Provider: home office   I discussed the limitations of evaluation and management by telemedicine and the availability of in person appointments. The patient expressed understanding and agreed to proceed.      I discussed the assessment and treatment plan with the patient. The patient was provided an opportunity to ask questions and all were answered. The patient agreed with the plan and demonstrated an understanding of the instructions.   The patient was advised to call back or seek an in-person evaluation if the symptoms worsen or if the condition fails to improve as anticipated.  I provided 15 minutes of non-face-to-face time during this encounter including chart review and documentat       History of Present Illness: Patient is a 56 years old currently single Caucasian female initially referred after hospital discharge January 21 she was admitted for 4 days. 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  Consequences of Substance Abuse: NA  Past Medical History:  Past Medical History:  Diagnosis Date   Allergy    Anxiety    Asthma    Depression    Family history of breast cancer    Family history of colon cancer    Family history of lung cancer    Family history of prostate cancer    Hyperlipidemia    Hypertension    Hypothyroidism    Prolonged PTT (partial thromboplastin time) 06/23/2019   Normal PT   Thyroid disease     Past Surgical History:  Procedure Laterality Date   APPENDECTOMY     ENDOMETRIAL BIOPSY  06/21/2019   EYE SURGERY     x 5   HYSTEROSCOPY WITH D & C N/A 10/27/2019   Procedure: DILATATION AND CURETTAGE /HYSTEROSCOPY;  Surgeon: Emily Filbert, MD;  Location: [facility];  Service: Gynecology;  Laterality: N/A;    Family Psychiatric History: mom depression, anxiety, alcohol use  Family History:  Family History  Problem Relation Age of Onset   Hypertension Mother    Stroke Mother    Lung cancer Mother 60   Anxiety disorder Mother    Depression Mother    Alcohol abuse Mother    Skin cancer Father    Prostate cancer Father 84   Hypertension Father    Diabetes Father    Thyroid disease Maternal  Aunt    Thyroid disease Maternal Uncle    Diabetes Brother    Hypertension Brother    Colon cancer Maternal Grandmother        dx 37s, d. 73s   Prostate cancer Paternal Grandfather 60   Breast cancer Cousin        dx 20s-30s    Social History:   Social History   Socioeconomic History   Marital status: Single    Spouse name: Not on file   Number of children: Not on file   Years of education: Not on file   Highest education level: Not on file  Occupational History   Not on file  Tobacco Use   Smoking status: Never   Smokeless tobacco: Never  Vaping Use    Vaping Use: Never used  Substance and Sexual Activity   Alcohol use: Yes    Comment: occ   Drug use: Never   Sexual activity: Not Currently    Birth control/protection: Abstinence  Other Topics Concern   Not on file  Social History Narrative   Not on file   Social Determinants of Health   Financial Resource Strain: Not on file  Food Insecurity: Not on file  Transportation Needs: Not on file  Physical Activity: Not on file  Stress: Not on file  Social Connections: Not on file     Allergies:   Allergies  Allergen Reactions   Lurasidone Other (See Comments)    Other reaction(s): hallucinations   Molds & Smuts Anaphylaxis    Other reaction(s): confusion, cough, eye swelling   Wheat Bran     Other reaction(s): abdominal pain, confusion, edema, flushing   Morphine And Related Other (See Comments)    Numb hands and feet   Peanut Oil Other (See Comments)    Stomach cramping, hand/foot swelling   Penicillins Hives   Polymyxin B-Trimethoprim Other (See Comments), Photosensitivity and Swelling   Abilify [Aripiprazole] Other (See Comments)    Suicidal thoughts and actions   Lurasidone Hcl     Tardive dyskinesia, akathisia   Citalopram Other (See Comments)    SSRI's ineffective - Citalopram, Fluoxetine, Sertraline, Excitalopram    Food Nausea Only, Swelling, Palpitations, Other (See Comments) and Cough    WHEAT   Prednisone Anxiety and Other (See Comments)    Mood changes, worsening depression   Singulair [Montelukast Sodium] Other (See Comments)    Mood changes, worsening depression    Metabolic Disorder Labs: Lab Results  Component Value Date   HGBA1C 5.6 04/12/2021   MPG 114 04/12/2021   MPG 119.76 11/08/2020   Lab Results  Component Value Date   PROLACTIN 9.2 11/09/2020   PROLACTIN 10.6 06/18/2019   Lab Results  Component Value Date   CHOL 202 (H) 07/04/2022   TRIG 164 (H) 07/04/2022   HDL 49 (L) 07/04/2022   CHOLHDL 4.1 07/04/2022   VLDL 18 11/08/2020    LDLCALC 125 (H) 07/04/2022   LDLCALC 112 (H) 05/15/2022   Lab Results  Component Value Date   TSH 1.24 05/15/2022    Therapeutic Level Labs: No results found for: "LITHIUM" No results found for: "CBMZ" No results found for: "VALPROATE"  Current Medications: Current Outpatient Medications  Medication Sig Dispense Refill   Albuterol Sulfate (PROAIR RESPICLICK) 277 (90 Base) MCG/ACT AEPB Inhale 1-2 puffs into the lungs every 4 (four) hours as needed. 1 each 4   ALPRAZolam (XANAX) 0.5 MG tablet Take 0.5-1 tablets (0.25-0.5 mg total) by mouth 2 (two) times daily as  needed for anxiety. 15 tablet 0   amLODipine (NORVASC) 5 MG tablet TAKE 1 TABLET BY MOUTH ONCE DAILY 90 tablet 1   Ascorbic Acid (VITAMIN C) 1000 MG tablet Take 1,000 mg by mouth daily.     atorvastatin (LIPITOR) 40 MG tablet Take 1 tablet (40 mg total) by mouth daily. Needs appointment for future refills / 2nd attempt 30 tablet 0   b complex vitamins tablet Take 1 tablet by mouth daily.     busPIRone (BUSPAR) 15 MG tablet Take 1 tablet (15 mg total) by mouth 2 (two) times daily. Delete prior refill 180 tablet 0   CALCIUM PO Take 500 mg by mouth daily.     cetirizine (ZYRTEC) 10 MG tablet Take 10 mg by mouth daily.     Cholecalciferol (VITAMIN D3) 10 MCG (400 UNIT) tablet Take 400 Units by mouth daily.     DULoxetine (CYMBALTA) 60 MG capsule Take 1 capsule (60 mg total) by mouth 2 (two) times daily. 60 capsule 1   EPINEPHrine 0.3 mg/0.3 mL IJ SOAJ injection Inject 0.3 mg into the muscle once as needed (anaphylaxis/allergic reaction). 2 each 3   Ferrous Sulfate (IRON PO) Take 325 mg by mouth daily.     fluticasone (FLONASE) 50 MCG/ACT nasal spray Place 1 spray into both nostrils daily. 16 g 5   Ginger, Zingiber officinalis, (GINGER ROOT PO) Take by mouth.     levothyroxine (SYNTHROID) 75 MCG tablet TAKE 1 TABLET BY MOUTH DAILY BEFORE BREAKFAST 90 tablet 1   lisinopril (ZESTRIL) 20 MG tablet TAKE 1 TABLET BY MOUTH ONCE DAILY 90  tablet 1   Magnesium 500 MG CAPS Take 500 mg by mouth daily.     Multiple Vitamin (MULTIVITAMIN) capsule Take 1 capsule by mouth daily.     naproxen (NAPROSYN) 500 MG tablet Take 1 tablet (500 mg) by mouth twice a day for 30 days. 60 tablet 0   Omega-3 Fatty Acids (FISH OIL) 1000 MG CAPS Take 1 capsule by mouth 2 (two) times daily.     polyethylene glycol (MIRALAX) 17 g packet Take the Miralax 17 G twice daily until Large BM then once daily. 30 each 6   Probiotic Product (PROBIOTIC ADVANCED PO) Take 1 capsule by mouth daily.     traZODone (DESYREL) 50 MG tablet Take 1 tablet (50 mg total) by mouth at bedtime. 30 tablet 0   No current facility-administered medications for this visit.     Psychiatric Specialty Exam: Review of Systems  Cardiovascular:  Negative for chest pain.  Psychiatric/Behavioral:  Positive for sleep disturbance. Negative for agitation and self-injury.     Last menstrual period 06/18/2017.There is no height or weight on file to calculate BMI.  General Appearance: Casual  Eye Contact:  Fair  Speech:  Clear and Coherent  Volume:  Normal  Mood: fair  Affect:  Congruent  Thought Process:  Goal Directed  Orientation:  Full (Time, Place, and Person)  Thought Content:  Rumination  Suicidal Thoughts:  No  Homicidal Thoughts:  No  Memory:  Immediate;   Fair Recent;   Fair  Judgement:  Fair  Insight:  Fair  Psychomotor Activity:  Normal  Concentration:  Concentration: Fair and Attention Span: Fair  Recall:  [employer] of Knowledge:Good  Language: Good  Akathisia:  No  Handed:    AIMS (if indicated):  No involuntary movements  Assets:  Desire for Improvement  ADL's:  Intact  Cognition: WNL  Sleep:   variable to fair  Screenings: AUDIT    Flowsheet Row Admission (Discharged) from 11/08/2020 in [facility]  Alcohol Use Disorder Identification Test Final Score (AUDIT) 3      GAD-7    Flowsheet Row Office Visit from  07/04/2022 in [facility] Office Visit from 05/15/2022 in [facility] Video Visit from 09/11/2020 in [facility] Video Visit from 08/25/2020 in [facility] Visit from 08/11/2020 in [facility]  Total GAD-7 Score '9 17 16 16 16      '$ PHQ2-9    [place] Office Visit from 07/04/2022 in [facility] Office Visit from 05/15/2022 in [facility] Visit from 08/17/2021 in [facility] Video Visit from 11/30/2020 in [facility] Video Visit from 09/11/2020 in [facility]  PHQ-2 Total Score '3 4 2 3 4  '$ PHQ-9 Total Score '12 18 7 9 20      '$ Flowsheet Row Video Visit from 07/19/2022 in [facility] ED from 05/03/2022 in [place] Video Visit from 04/03/2022 in [facility] No Risk No Risk No Risk       Assessment and Plan: as follows  Prior documentation reviewed   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

## 2022-08-12 ENCOUNTER — Other Ambulatory Visit (HOSPITAL_COMMUNITY): Payer: Self-pay | Admitting: Psychiatry

## 2022-08-12 ENCOUNTER — Other Ambulatory Visit: Payer: Self-pay | Admitting: Cardiology

## 2022-08-13 ENCOUNTER — Other Ambulatory Visit (HOSPITAL_BASED_OUTPATIENT_CLINIC_OR_DEPARTMENT_OTHER): Payer: Self-pay

## 2022-08-13 MED ORDER — TRAZODONE HCL 50 MG PO TABS
50.0000 mg | ORAL_TABLET | Freq: Every day | ORAL | 0 refills | Status: DC
Start: 2022-08-13 — End: 2022-12-04
  Filled 2022-08-13: qty 30, 30d supply, fill #0

## 2022-08-19 ENCOUNTER — Other Ambulatory Visit (HOSPITAL_COMMUNITY): Payer: Self-pay

## 2022-09-17 ENCOUNTER — Other Ambulatory Visit (HOSPITAL_BASED_OUTPATIENT_CLINIC_OR_DEPARTMENT_OTHER): Payer: Self-pay

## 2022-09-17 ENCOUNTER — Other Ambulatory Visit (HOSPITAL_COMMUNITY): Payer: Self-pay | Admitting: Psychiatry

## 2022-09-17 MED ORDER — DULOXETINE HCL 60 MG PO CPEP
60.0000 mg | ORAL_CAPSULE | Freq: Two times a day (BID) | ORAL | 1 refills | Status: DC
  Filled 2022-09-17: qty 60, 30d supply, fill #0

## 2022-09-25 ENCOUNTER — Other Ambulatory Visit (HOSPITAL_COMMUNITY): Payer: Self-pay

## 2022-09-26 ENCOUNTER — Other Ambulatory Visit (HOSPITAL_BASED_OUTPATIENT_CLINIC_OR_DEPARTMENT_OTHER): Payer: Self-pay

## 2022-10-21 IMAGING — CT CT ABDOMEN WO/W CM
3 of 14 series · 11 of 46 positions shown, 17 images · IV contrast (Omnipaque)
Comparison: CT September 25, 2021

CLINICAL DATA: Abdominal pain, follow-up from prior CT.

EXAM:
CT ABDOMEN WITHOUT AND WITH CONTRAST
TECHNIQUE: Multidetector CT imaging of the abdomen was performed following the
standard protocol before and following the bolus administration of
intravenous contrast.

[Series 5: axial arterial · axial · arterial · 0.78mm/px · z∈[-436,-340]mm · 4 of 76 slices shown]
[im 11/76  soft-tissue]
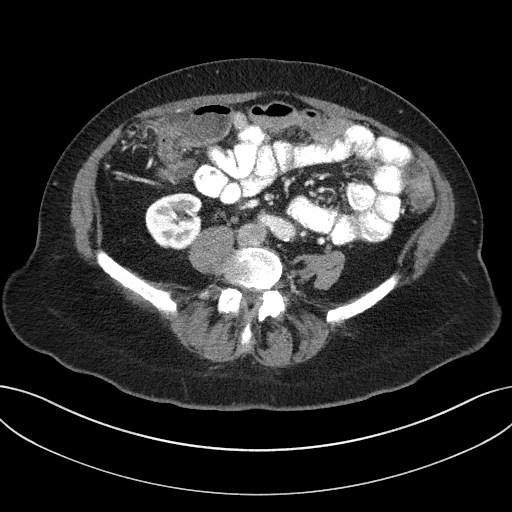
[im 22/76  soft-tissue]
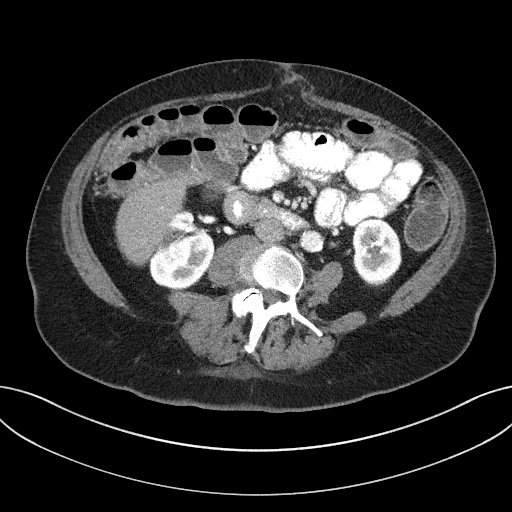
[im 33/76  soft-tissue]
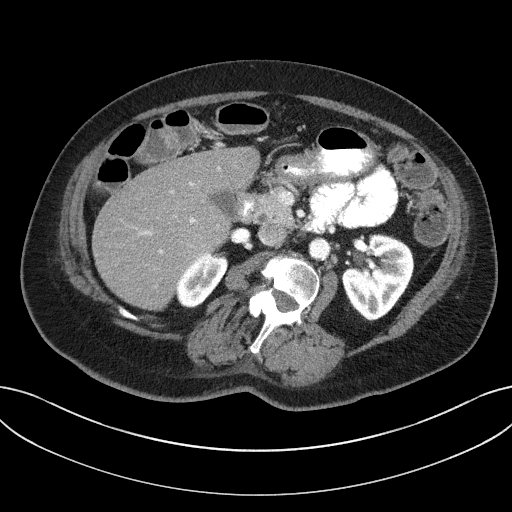
[im 43/76  soft-tissue]
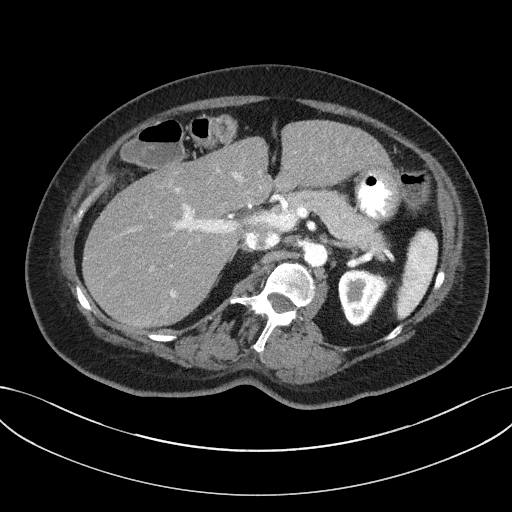

[Series 8: axial venous · axial · portal-venous · 0.78mm/px · z∈[-436,-274]mm · 6 of 76 slices shown, 11 images]
[im 11/76  soft-tissue]
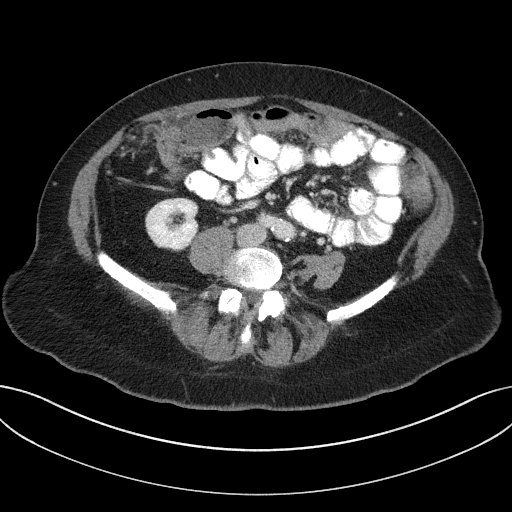
[im 11/76  bone]
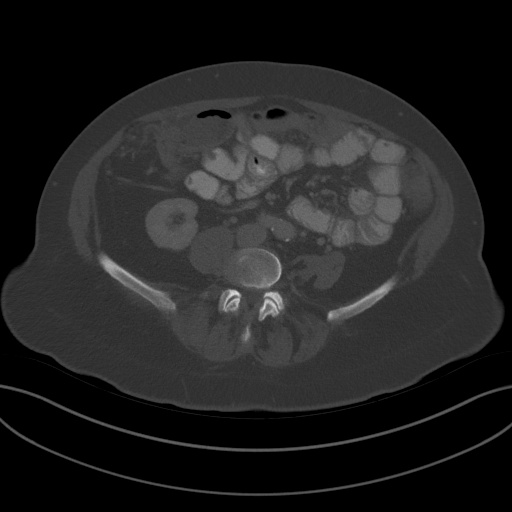
[im 22/76  soft-tissue]
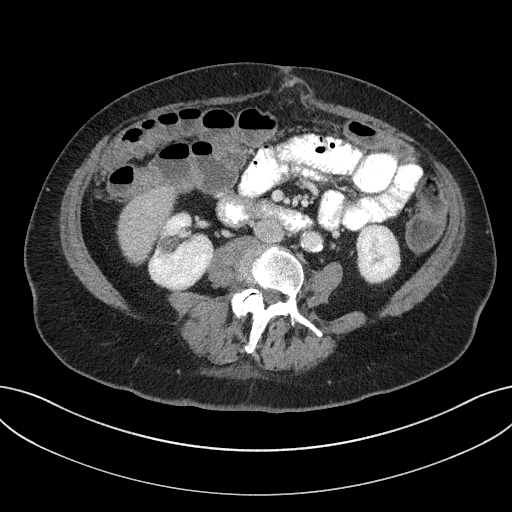
[im 33/76  soft-tissue]
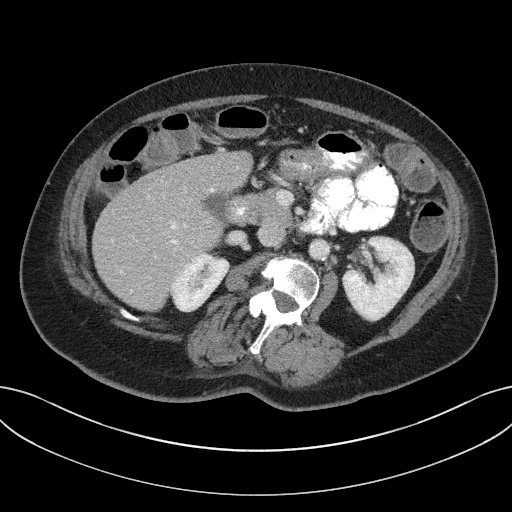
[im 33/76  lung]
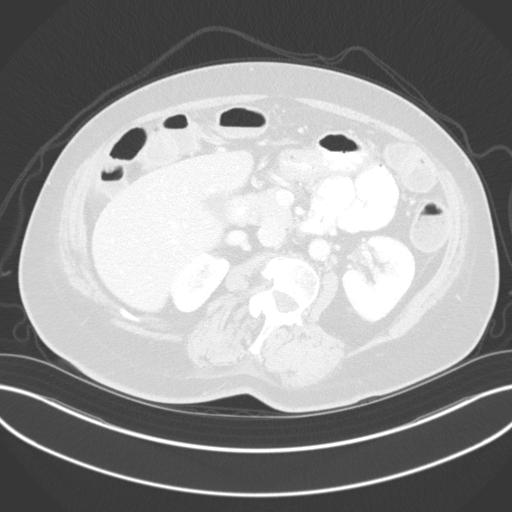
[im 43/76  soft-tissue]
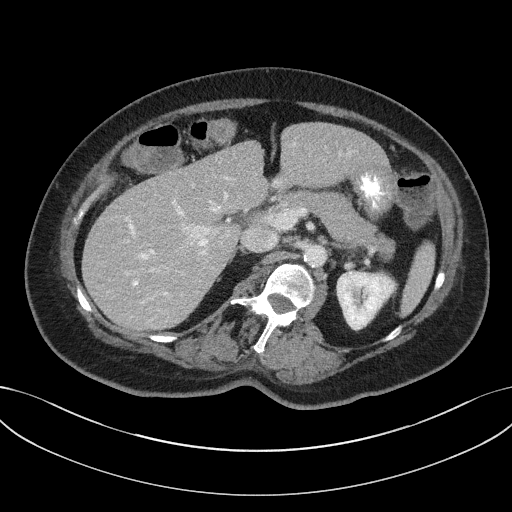
[im 43/76  lung]
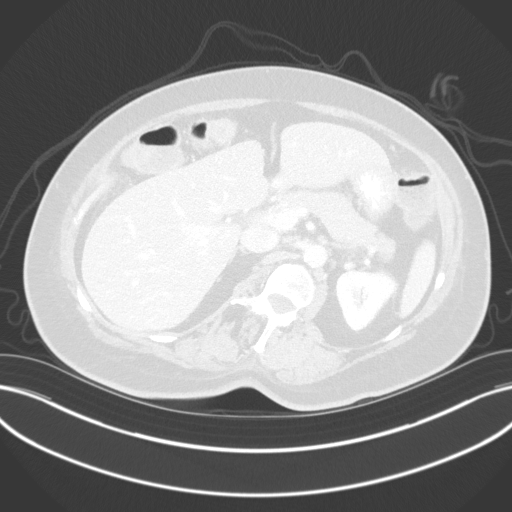
[im 54/76  soft-tissue]
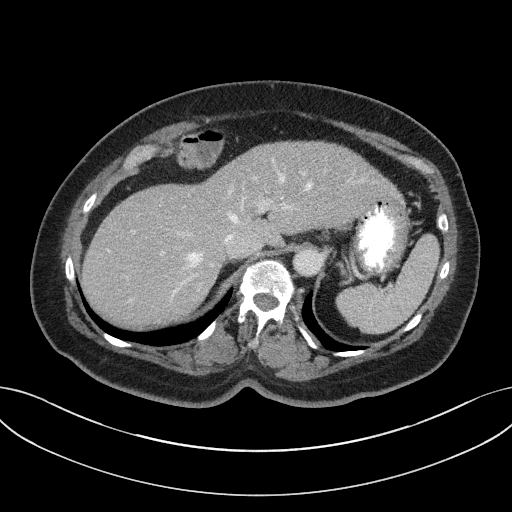
[im 54/76  lung]
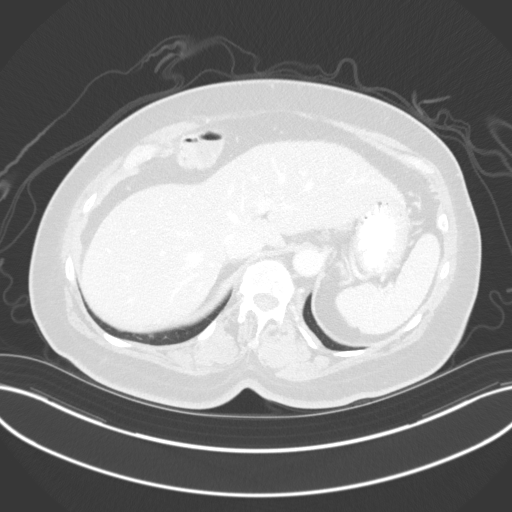
[im 65/76  soft-tissue]
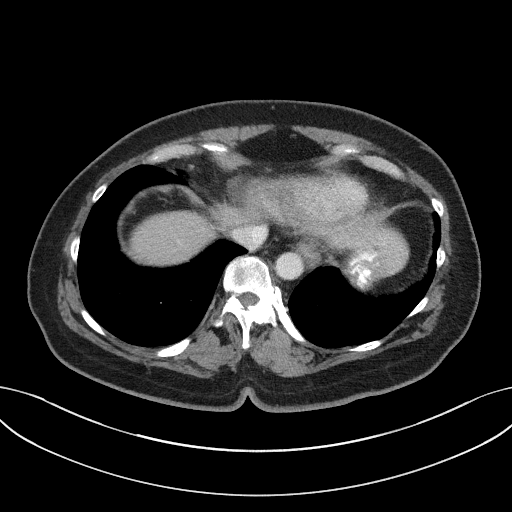
[im 65/76  lung]
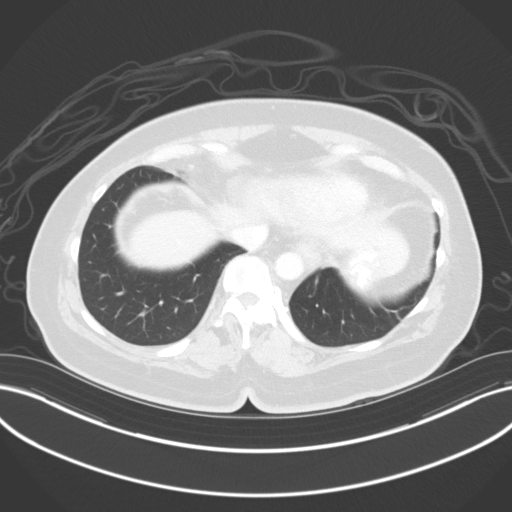

[Series 16: coronal venous · coronal · portal-venous · 0.46mm/px · 1 of 142 slices shown, 2 images]
[im 71/142  soft-tissue]
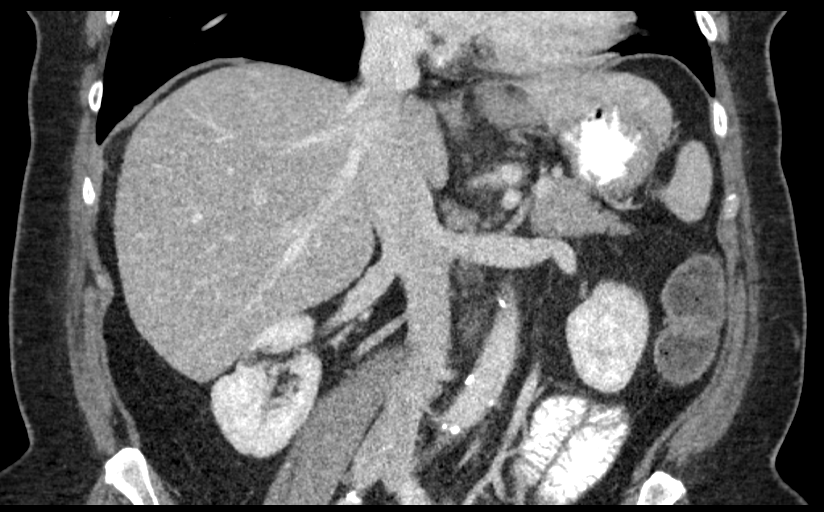
[im 71/142  bone]
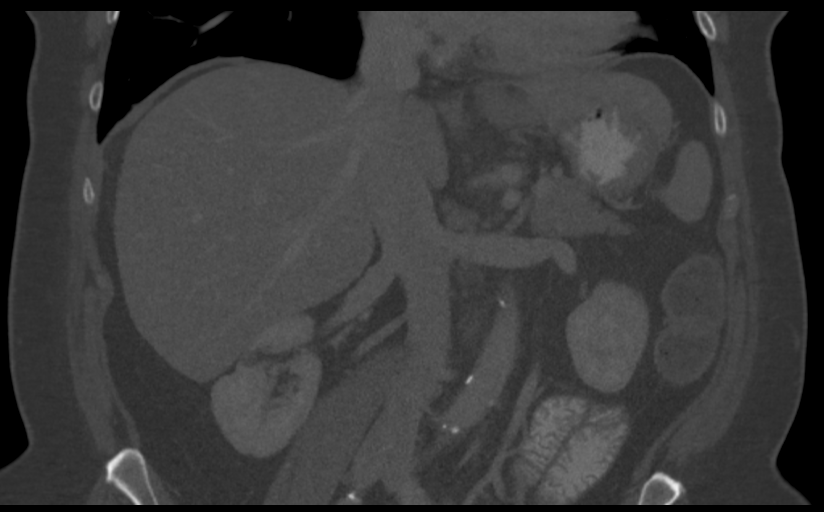

[11 of 46 positions shown; findings below may reference images not displayed]

RADIATION DOSE REDUCTION: This exam was performed according to the
departmental dose-optimization program which includes automated
exposure control, adjustment of the mA and/or kV according to
patient size and/or use of iterative reconstruction technique.

CONTRAST:  100mL OMNIPAQUE IOHEXOL 300 MG/ML  SOLN
FINDINGS: Lower chest: No acute abnormality.

Hepatobiliary: No suspicious hepatic lesion. Gallbladder is
unremarkable. No biliary ductal dilation.

Pancreas: No pancreatic ductal dilation or evidence of acute
inflammation.

Spleen: No splenomegaly or focal splenic lesion.

Adrenals/Urinary Tract: Bilateral adrenal glands appear normal. No
hydronephrosis. Similar cortical scarring and mild caliectasis in
the interpolar right kidney. Subcentimeter left renal hypodensity is
technically too small to accurately characterize but statistically
likely to reflect a cyst. Kidneys demonstrate symmetric enhancement
and excretion of contrast material.

Stomach/Bowel: Radiopaque enteric contrast material traverses distal
loops of small bowel. Stomach is unremarkable for degree of
distension. Periampullary duodenal diverticulum. No pathologic
dilation or evidence of acute inflammation involving loops of large
or small bowel in the abdomen. Mild short segment wall thickening of
the ascending colon which persists throughout each phase of imaging
for instance on image 60/5, which may reflect underdistention
however a discrete mass lesion is not excluded.

Vascular/Lymphatic: Aortic and branch vessel atherosclerosis without
abdominal aortic aneurysm.

Other: Decrease in the right upper quadrant/perihepatic
nodularity/inflammation for instance on image 86/3 which likely
reflects fat necrosis possibly related to prior trauma or
inflammation. Fat containing ventral hernia.

Musculoskeletal: Levoconvex curvature of the thoracolumbar spine
with associated degenerative change.
IMPRESSION: 1. Decrease in the right upper quadrant/perihepatic
nodularity/inflammation which likely reflects fat necrosis possibly
related to prior trauma or inflammation.
2. Mild short segment wall thickening of the ascending colon which
persists throughout each phase of imaging. While this may reflect
underdistention a discrete mass lesion is not excluded. Consider
correlation with colonoscopy.
3. Fat containing ventral hernia.
4.  Aortic Atherosclerosis ([id number]).

## 2022-10-25 ENCOUNTER — Telehealth (HOSPITAL_COMMUNITY): Payer: No Typology Code available for payment source | Admitting: Psychiatry

## 2022-10-29 ENCOUNTER — Encounter (HOSPITAL_COMMUNITY): Payer: Self-pay | Admitting: Psychiatry

## 2022-10-29 ENCOUNTER — Other Ambulatory Visit (HOSPITAL_BASED_OUTPATIENT_CLINIC_OR_DEPARTMENT_OTHER): Payer: Self-pay

## 2022-10-29 ENCOUNTER — Telehealth (INDEPENDENT_AMBULATORY_CARE_PROVIDER_SITE_OTHER): Payer: 59 | Admitting: Psychiatry

## 2022-10-29 DIAGNOSIS — F5102 Adjustment insomnia: Secondary | ICD-10-CM

## 2022-10-29 DIAGNOSIS — F411 Generalized anxiety disorder: Secondary | ICD-10-CM

## 2022-10-29 DIAGNOSIS — F333 Major depressive disorder, recurrent, severe with psychotic symptoms: Secondary | ICD-10-CM | POA: Diagnosis not present

## 2022-10-29 MED ORDER — BUPROPION HCL ER (SR) 100 MG PO TB12
100.0000 mg | ORAL_TABLET | Freq: Two times a day (BID) | ORAL | 0 refills | Status: DC
Start: 2022-10-29 — End: 2022-11-14
  Filled 2022-10-29: qty 30, 15d supply, fill #0

## 2022-10-29 MED ORDER — BUSPIRONE HCL 15 MG PO TABS
15.0000 mg | ORAL_TABLET | Freq: Two times a day (BID) | ORAL | 0 refills | Status: DC
  Filled 2022-10-29: qty 180, 90d supply, fill #0

## 2022-10-29 MED ORDER — DULOXETINE HCL 60 MG PO CPEP
60.0000 mg | ORAL_CAPSULE | Freq: Two times a day (BID) | ORAL | 1 refills | Status: DC
  Filled 2022-10-29: qty 60, 30d supply, fill #0
  Filled 2022-12-04 – 2022-12-13 (×2): qty 60, 30d supply, fill #1

## 2022-10-29 NOTE — Progress Notes (Signed)
Shannon Santiago Follow up visit   Patient Identification: Shannon Santiago MRN:  [id number] Date of Evaluation:  10/29/2022 Referral Source: [facility] discharge Chief Complaint:  follow up depression, grief Visit Diagnosis:    ICD-10-CM   1. MDD (major depressive disorder), recurrent, severe, with psychosis ([place])  F33.3     2. GAD (generalized anxiety disorder)  F41.1     3. Adjustment insomnia  F51.02      Virtual Visit via Video Note  I connected with Shannon Santiago on 10/29/22 at  3:00 PM EST by a video enabled telemedicine application and verified that I am speaking with the correct person using two identifiers.  Location: Patient: home Provider: office   I discussed the limitations of evaluation and management by telemedicine and the availability of in person appointments. The patient expressed understanding and agreed to proceed.     I discussed the assessment and treatment plan with the patient. The patient was provided an opportunity to ask questions and all were answered. The patient agreed with the plan and demonstrated an understanding of the instructions.   The patient was advised to call back or seek an in-person evaluation if the symptoms worsen or if the condition fails to improve as anticipated.  I provided 15 - 20  minutes of non-face-to-face time during this encounter.         History of Present Illness: Patient is a 57 years old currently single Caucasian female initially referred after hospital discharge January 21 she was admitted for 4 days. 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    Shannon Santiago gets subdued, lonely difficult in winter time, going thru grief but handling it Has used wellbutrin was helpful but started shakes in the past    Has got her house but difficult to let go of her dad house with memories  Aggravating factors; grief of mom and now dad's death, recents dad's death Modifying factors: cat Duration since young  age  Denies drug use Severity depression: manageable Duration more then one year Associated Signs/Symptoms:   Past Psychiatric History: depression, anxity  Previous Psychotropic Medications: Yes   Substance Abuse History in the last 12 months:  No.  Consequences of Substance Abuse: NA  Past Medical History:  Past Medical History:  Diagnosis Date   Allergy    Anxiety    Asthma    Depression    Family history of breast cancer    Family history of colon cancer    Family history of lung cancer    Family history of prostate cancer    Hyperlipidemia    Hypertension    Hypothyroidism    Prolonged PTT (partial thromboplastin time) 06/23/2019   Normal PT   Thyroid disease     Past Surgical History:  Procedure Laterality Date   APPENDECTOMY     ENDOMETRIAL BIOPSY  06/21/2019   EYE SURGERY     x 5   HYSTEROSCOPY WITH D & C N/A 10/27/2019   Procedure: DILATATION AND CURETTAGE /HYSTEROSCOPY;  Surgeon: Emily Filbert, MD;  Location: [facility];  Service: Gynecology;  Laterality: N/A;    Family Psychiatric History: mom depression, anxiety, alcohol use  Family History:  Family History  Problem Relation Age of Onset   Hypertension Mother    Stroke Mother    Lung cancer Mother 68   Anxiety disorder Mother    Depression Mother    Alcohol abuse Mother    Skin cancer Father  Prostate cancer Father 59   Hypertension Father    Diabetes Father    Thyroid disease Maternal Aunt    Thyroid disease Maternal Uncle    Diabetes Brother    Hypertension Brother    Colon cancer Maternal Grandmother        dx 12s, d. 36s   Prostate cancer Paternal Grandfather 100   Breast cancer Cousin        dx 20s-30s    Social History:   Social History   Socioeconomic History   Marital status: Single    Spouse name: Not on file   Number of children: Not on file   Years of education: Not on file   Highest education level: Not on file  Occupational History   Not on file   Tobacco Use   Smoking status: Never   Smokeless tobacco: Never  Vaping Use   Vaping Use: Never used  Substance and Sexual Activity   Alcohol use: Yes    Comment: occ   Drug use: Never   Sexual activity: Not Currently    Birth control/protection: Abstinence  Other Topics Concern   Not on file  Social History Narrative   Not on file   Social Determinants of Health   Financial Resource Strain: Not on file  Food Insecurity: Not on file  Transportation Needs: Not on file  Physical Activity: Not on file  Stress: Not on file  Social Connections: Not on file     Allergies:   Allergies  Allergen Reactions   Lurasidone Other (See Comments)    Other reaction(s): hallucinations   Molds & Smuts Anaphylaxis    Other reaction(s): confusion, cough, eye swelling   Wheat Bran     Other reaction(s): abdominal pain, confusion, edema, flushing   Morphine And Related Other (See Comments)    Numb hands and feet   Peanut Oil Other (See Comments)    Stomach cramping, hand/foot swelling   Penicillins Hives   Polymyxin B-Trimethoprim Other (See Comments), Photosensitivity and Swelling   Abilify [Aripiprazole] Other (See Comments)    Suicidal thoughts and actions   Lurasidone Hcl     Tardive dyskinesia, akathisia   Citalopram Other (See Comments)    SSRI's ineffective - Citalopram, Fluoxetine, Sertraline, Excitalopram    Food Nausea Only, Swelling, Palpitations, Other (See Comments) and Cough    WHEAT   Prednisone Anxiety and Other (See Comments)    Mood changes, worsening depression   Singulair [Montelukast Sodium] Other (See Comments)    Mood changes, worsening depression    Metabolic Disorder Labs: Lab Results  Component Value Date   HGBA1C 5.6 04/12/2021   MPG 114 04/12/2021   MPG 119.76 11/08/2020   Lab Results  Component Value Date   PROLACTIN 9.2 11/09/2020   PROLACTIN 10.6 06/18/2019   Lab Results  Component Value Date   CHOL 202 (H) 07/04/2022   TRIG 164 (H)  07/04/2022   HDL 49 (L) 07/04/2022   CHOLHDL 4.1 07/04/2022   VLDL 18 11/08/2020   LDLCALC 125 (H) 07/04/2022   LDLCALC 112 (H) 05/15/2022   Lab Results  Component Value Date   TSH 1.24 05/15/2022    Therapeutic Level Labs: No results found for: "LITHIUM" No results found for: "CBMZ" No results found for: "VALPROATE"  Current Medications: Current Outpatient Medications  Medication Sig Dispense Refill   buPROPion ER (WELLBUTRIN SR) 100 MG 12 hr tablet Take 1 tablet (100 mg total) by mouth 2 (two) times daily. 30 tablet 0  Albuterol Sulfate (PROAIR RESPICLICK) 053 (90 Base) MCG/ACT AEPB Inhale 1-2 puffs into the lungs every 4 (four) hours as needed. 1 each 4   ALPRAZolam (XANAX) 0.5 MG tablet Take 0.5-1 tablets (0.25-0.5 mg total) by mouth 2 (two) times daily as needed for anxiety. 15 tablet 0   amLODipine (NORVASC) 5 MG tablet TAKE 1 TABLET BY MOUTH ONCE DAILY 90 tablet 1   Ascorbic Acid (VITAMIN C) 1000 MG tablet Take 1,000 mg by mouth daily.     atorvastatin (LIPITOR) 40 MG tablet Take 1 tablet (40 mg total) by mouth daily. Needs appointment for future refills / 2nd attempt 30 tablet 0   b complex vitamins tablet Take 1 tablet by mouth daily.     busPIRone (BUSPAR) 15 MG tablet Take 1 tablet (15 mg total) by mouth 2 (two) times daily. Delete prior refill 180 tablet 0   CALCIUM PO Take 500 mg by mouth daily.     cetirizine (ZYRTEC) 10 MG tablet Take 10 mg by mouth daily.     Cholecalciferol (VITAMIN D3) 10 MCG (400 UNIT) tablet Take 400 Units by mouth daily.     DULoxetine (CYMBALTA) 60 MG capsule Take 1 capsule (60 mg total) by mouth 2 (two) times daily. 60 capsule 1   EPINEPHrine 0.3 mg/0.3 mL IJ SOAJ injection Inject 0.3 mg into the muscle once as needed (anaphylaxis/allergic reaction). 2 each 3   Ferrous Sulfate (IRON PO) Take 325 mg by mouth daily.     fluticasone (FLONASE) 50 MCG/ACT nasal spray Place 1 spray into both nostrils daily. 16 g 5   Ginger, Zingiber officinalis,  (GINGER ROOT PO) Take by mouth.     levothyroxine (SYNTHROID) 75 MCG tablet TAKE 1 TABLET BY MOUTH DAILY BEFORE BREAKFAST 90 tablet 1   lisinopril (ZESTRIL) 20 MG tablet TAKE 1 TABLET BY MOUTH ONCE DAILY 90 tablet 1   Magnesium 500 MG CAPS Take 500 mg by mouth daily.     Multiple Vitamin (MULTIVITAMIN) capsule Take 1 capsule by mouth daily.     naproxen (NAPROSYN) 500 MG tablet Take 1 tablet (500 mg) by mouth twice a day for 30 days. 60 tablet 0   Omega-3 Fatty Acids (FISH OIL) 1000 MG CAPS Take 1 capsule by mouth 2 (two) times daily.     polyethylene glycol (MIRALAX) 17 g packet Take the Miralax 17 G twice daily until Large BM then once daily. 30 each 6   Probiotic Product (PROBIOTIC ADVANCED PO) Take 1 capsule by mouth daily.     traZODone (DESYREL) 50 MG tablet Take 1 tablet (50 mg total) by mouth at bedtime. 30 tablet 0   No current facility-administered medications for this visit.     Psychiatric Specialty Exam: Review of Systems  Cardiovascular:  Negative for chest pain.  Psychiatric/Behavioral:  Positive for dysphoric mood and sleep disturbance. Negative for agitation and self-injury.     Last menstrual period 06/18/2017.There is no height or weight on file to calculate BMI.  General Appearance: Casual  Eye Contact:  Fair  Speech:  Clear and Coherent  Volume:  Normal  Mood: fair  Affect:  Congruent  Thought Process:  Goal Directed  Orientation:  Full (Time, Place, and Person)  Thought Content:  Rumination  Suicidal Thoughts:  No  Homicidal Thoughts:  No  Memory:  Immediate;   Fair Recent;   Fair  Judgement:  Fair  Insight:  Fair  Psychomotor Activity:  Normal  Concentration:  Concentration: Fair and Attention Span: Fair  Recall:  Smiley Houseman of Knowledge:Good  Language: Good  Akathisia:  No  Handed:    AIMS (if indicated):  No involuntary movements  Assets:  Desire for Improvement  ADL's:  Intact  Cognition: WNL  Sleep:   variable to fair   Screenings: AUDIT     Flowsheet Row Admission (Discharged) from 11/08/2020 in [facility]  Alcohol Use Disorder Identification Test Final Score (AUDIT) 3      GAD-7    Flowsheet Row Office Visit from 07/04/2022 in [place] Office Visit from 05/15/2022 in [place] Video Visit from 09/11/2020 in [place] Video Visit from 08/25/2020 in [place] Visit from 08/11/2020 in [place]  Total GAD-7 Score '9 17 16 16 16      '$ PHQ2-9    [place] Office Visit from 07/04/2022 in [place] Office Visit from 05/15/2022 in [place] Visit from 08/17/2021 in [place] Video Visit from 11/30/2020 in [place] Video Visit from 09/11/2020 in [place]  PHQ-2 Total Score '3 4 2 3 4  '$ PHQ-9 Total Score '12 18 7 9 20      '$ Flowsheet Row Video Visit from 07/19/2022 in [place] ED from 05/03/2022 in [place] Video Visit from 04/03/2022 in [place] No Risk No Risk No Risk       Assessment and Plan: as follows  Prior documentation reviewed   Major depressive disorder recurrent severe without psychotic featuresi: Somewhat subded, also seasonal depression, continue cymbalta, add small dose of wellbutrin '100mg'$  call back if concerns, reviewed side effects   Continue therapy     Generalized anxiety disorder; fair, continue buspar and distractions  Insomnia:can be irregular, discussed sleep hygiene, not taking trazadone regularly says will keep prn basis  Fu 71m     NMerian Capron MD 1/9/20243:12 PM

## 2022-11-04 ENCOUNTER — Other Ambulatory Visit (HOSPITAL_BASED_OUTPATIENT_CLINIC_OR_DEPARTMENT_OTHER): Payer: Self-pay

## 2022-11-10 ENCOUNTER — Other Ambulatory Visit: Payer: Self-pay | Admitting: Medical-Surgical

## 2022-11-10 DIAGNOSIS — I10 Essential (primary) hypertension: Secondary | ICD-10-CM

## 2022-11-10 DIAGNOSIS — E063 Autoimmune thyroiditis: Secondary | ICD-10-CM

## 2022-11-12 ENCOUNTER — Other Ambulatory Visit (HOSPITAL_BASED_OUTPATIENT_CLINIC_OR_DEPARTMENT_OTHER): Payer: Self-pay

## 2022-11-12 MED ORDER — LISINOPRIL 20 MG PO TABS
20.0000 mg | ORAL_TABLET | Freq: Every day | ORAL | 1 refills | Status: DC
  Filled 2022-11-12: qty 90, 90d supply, fill #0
  Filled 2023-02-28: qty 90, 90d supply, fill #1

## 2022-11-12 MED ORDER — AMLODIPINE BESYLATE 5 MG PO TABS
5.0000 mg | ORAL_TABLET | Freq: Every day | ORAL | 1 refills | Status: DC
  Filled 2022-11-12 (×2): qty 90, 90d supply, fill #0
  Filled 2023-02-28: qty 90, 90d supply, fill #1

## 2022-11-12 MED ORDER — LEVOTHYROXINE SODIUM 75 MCG PO TABS
75.0000 ug | ORAL_TABLET | Freq: Every day | ORAL | 1 refills | Status: DC
  Filled 2022-11-12: qty 90, 90d supply, fill #0
  Filled 2023-02-28: qty 90, 90d supply, fill #1

## 2022-11-14 ENCOUNTER — Other Ambulatory Visit (HOSPITAL_BASED_OUTPATIENT_CLINIC_OR_DEPARTMENT_OTHER): Payer: Self-pay

## 2022-11-14 ENCOUNTER — Telehealth (HOSPITAL_COMMUNITY): Payer: Self-pay

## 2022-11-14 MED ORDER — BUPROPION HCL ER (SR) 100 MG PO TB12
100.0000 mg | ORAL_TABLET | Freq: Two times a day (BID) | ORAL | 0 refills | Status: DC
Start: 2022-11-14 — End: 2022-12-13
  Filled 2022-11-14: qty 60, 30d supply, fill #0

## 2022-11-14 NOTE — Telephone Encounter (Signed)
Medication refill request - Message left from patient that the Bupropion ER that Dr. De Nurse started her on was working very well but she needed a new order as the inital order was for only 2 weeks, prescribed 10/29/22 and patient returns next on 12/30/22.  Order to be sent to the [facility].

## 2022-11-14 NOTE — Telephone Encounter (Signed)
Medication management - Telephone call with patient to inform Dr. De Nurse had sent in a new Bupropion ER order as requested to her [place].

## 2022-11-15 ENCOUNTER — Other Ambulatory Visit (HOSPITAL_BASED_OUTPATIENT_CLINIC_OR_DEPARTMENT_OTHER): Payer: Self-pay

## 2022-11-15 ENCOUNTER — Encounter: Payer: Self-pay | Admitting: Medical-Surgical

## 2022-11-15 ENCOUNTER — Ambulatory Visit (INDEPENDENT_AMBULATORY_CARE_PROVIDER_SITE_OTHER): Payer: 59 | Admitting: Medical-Surgical

## 2022-11-15 VITALS — BP 103/70 | HR 93 | Resp 20 | Ht 61.0 in | Wt 176.0 lb

## 2022-11-15 DIAGNOSIS — I10 Essential (primary) hypertension: Secondary | ICD-10-CM

## 2022-11-15 DIAGNOSIS — E785 Hyperlipidemia, unspecified: Secondary | ICD-10-CM

## 2022-11-15 MED ORDER — ROSUVASTATIN CALCIUM 10 MG PO TABS
10.0000 mg | ORAL_TABLET | Freq: Every day | ORAL | 3 refills | Status: DC
  Filled 2022-11-15: qty 90, 90d supply, fill #0

## 2022-11-15 NOTE — Progress Notes (Signed)
Established Patient Office Visit  Subjective   Patient ID: Shannon Santiago, female   DOB: 1966/05/17 Age: 57 y.o. MRN: [id number]   Chief Complaint  Patient presents with   Follow-up   Hypertension   HPI Pleasant 57 year old female presenting today for follow-up on:  Hypertension: Not regularly checking blood pressures at home.  Taking lisinopril 20 mg daily and amlodipine 5 mg daily, tolerating well without side effects.  Following a low-sodium diet although she does occasionally add pink salt to her foods.  Notes a history of having low sodium levels and this helps.  Occasional exercise. Denies CP, SOB, palpitations, lower extremity edema, dizziness, headaches, or vision changes.  HLD: Previously taking atorvastatin 40 mg daily.  Has been out of this for approximately 2 months because she was due for a follow-up appointment.  After stopping the atorvastatin, she noted that the joint aches and pains that she been having began to improve.  She feels it was related to the atorvastatin and would like to avoid restarting it.  She is open to other options for cholesterol management.  She is currently under the care of psychiatry on multiple medications to help manage chronic depression.  Notes that her symptoms have worsened since her father's death last year and that she has been struggling.  Is following up with her psychiatrist as recommended.  Has never had a [facility] report completed but is interested in this.   Objective:    Vitals:   11/15/22 0815  BP: 103/70  Pulse: 93  Resp: 20  Height: '5\' 1"'$  (1.549 m)  Weight: 176 lb (79.8 kg)  SpO2: 97%  BMI (Calculated): 33.27    Physical Exam Vitals and nursing note reviewed.  Constitutional:      General: She is not in acute distress.    Appearance: Normal appearance. She is obese. She is not ill-appearing.  HENT:     Head: Normocephalic and atraumatic.  Cardiovascular:     Rate and Rhythm: Normal rate and regular rhythm.      Pulses: Normal pulses.     Heart sounds: Normal heart sounds.  Pulmonary:     Effort: Pulmonary effort is normal. No respiratory distress.     Breath sounds: Normal breath sounds. No wheezing, rhonchi or rales.  Skin:    General: Skin is warm and dry.  Neurological:     Mental Status: She is alert and oriented to person, place, and time.  Psychiatric:        Mood and Affect: Mood normal.        Behavior: Behavior normal.        Thought Content: Thought content normal.        Judgment: Judgment normal.      No results found for this or any previous visit (from the past 24 hour(s)).     The 10-year ASCVD risk score (Arnett DK, et al., 2019) is: 2.1%   Values used to calculate the score:     Age: 29 years     Sex: Female     Is Non-Hispanic African American: No     Diabetic: No     Tobacco smoker: No     Systolic Blood Pressure: 350 mmHg     Is BP treated: Yes     HDL Cholesterol: 49 mg/dL     Total Cholesterol: 202 mg/dL   Assessment & Plan:   1. Primary hypertension Blood pressure at goal.  Continue amlodipine and lisinopril as prescribed.  Monitor  blood pressure at home with a goal of 130/80 or less.  Follow low-sodium diet.  Aim for regular intentional exercise with a goal for weight loss.  2. Hyperlipidemia, unspecified hyperlipidemia type Discontinue atorvastatin.  Added to intolerance list.  Start rosuvastatin 10 mg daily.  Monitor for worsening joint aches and pains and if this does occur, reduce dose to 5 mg daily.  Recheck lipids and CMP in 6-8 weeks. - Lipid panel - Comprehensive Metabolic Panel (CMET)   Return for annual physical exam at your convenience (August).  ___________________________________________ Clearnce Sorrel, DNP, APRN, FNP-BC Primary [facility] Sports [facility]

## 2022-11-26 ENCOUNTER — Encounter: Payer: Self-pay | Admitting: Medical-Surgical

## 2022-11-29 ENCOUNTER — Telehealth: Payer: Self-pay

## 2022-11-29 NOTE — Telephone Encounter (Signed)
Patient called wanting to know if you can have the pre-authorization for the genetic testing back dated because she received a bill for $5700 and she just can not pay that.

## 2022-12-03 NOTE — Telephone Encounter (Signed)
I called [facility] [phone] and they state the bill is not $5,700. They would not tell my the amount. Narine is going to call to find out the cost.

## 2022-12-04 ENCOUNTER — Other Ambulatory Visit (HOSPITAL_BASED_OUTPATIENT_CLINIC_OR_DEPARTMENT_OTHER): Payer: Self-pay

## 2022-12-04 ENCOUNTER — Other Ambulatory Visit: Payer: Self-pay

## 2022-12-04 ENCOUNTER — Other Ambulatory Visit (HOSPITAL_COMMUNITY): Payer: Self-pay | Admitting: Psychiatry

## 2022-12-04 MED ORDER — TRAZODONE HCL 50 MG PO TABS
50.0000 mg | ORAL_TABLET | Freq: Every day | ORAL | 0 refills | Status: DC
  Filled 2022-12-04 – 2022-12-13 (×2): qty 30, 30d supply, fill #0

## 2022-12-05 NOTE — Telephone Encounter (Signed)
The CPT codes 0345U and [id number]

## 2022-12-05 NOTE — Telephone Encounter (Signed)
No PA required, ref # [id number]. Patient is going to pay out of pocket for the test.

## 2022-12-12 ENCOUNTER — Other Ambulatory Visit (HOSPITAL_BASED_OUTPATIENT_CLINIC_OR_DEPARTMENT_OTHER): Payer: Self-pay

## 2022-12-13 ENCOUNTER — Other Ambulatory Visit (HOSPITAL_BASED_OUTPATIENT_CLINIC_OR_DEPARTMENT_OTHER): Payer: Self-pay

## 2022-12-13 ENCOUNTER — Other Ambulatory Visit: Payer: Self-pay

## 2022-12-13 ENCOUNTER — Other Ambulatory Visit (HOSPITAL_COMMUNITY): Payer: Self-pay | Admitting: Psychiatry

## 2022-12-13 MED ORDER — BUPROPION HCL ER (SR) 100 MG PO TB12
100.0000 mg | ORAL_TABLET | Freq: Every day | ORAL | 0 refills | Status: DC
  Filled 2022-12-13: qty 30, 30d supply, fill #0

## 2022-12-16 ENCOUNTER — Other Ambulatory Visit (HOSPITAL_BASED_OUTPATIENT_CLINIC_OR_DEPARTMENT_OTHER): Payer: Self-pay

## 2022-12-30 ENCOUNTER — Encounter (HOSPITAL_COMMUNITY): Payer: Self-pay | Admitting: Psychiatry

## 2022-12-30 ENCOUNTER — Telehealth (INDEPENDENT_AMBULATORY_CARE_PROVIDER_SITE_OTHER): Payer: 59 | Admitting: Psychiatry

## 2022-12-30 ENCOUNTER — Other Ambulatory Visit (HOSPITAL_BASED_OUTPATIENT_CLINIC_OR_DEPARTMENT_OTHER): Payer: Self-pay

## 2022-12-30 DIAGNOSIS — F411 Generalized anxiety disorder: Secondary | ICD-10-CM | POA: Diagnosis not present

## 2022-12-30 DIAGNOSIS — F5102 Adjustment insomnia: Secondary | ICD-10-CM

## 2022-12-30 DIAGNOSIS — F333 Major depressive disorder, recurrent, severe with psychotic symptoms: Secondary | ICD-10-CM

## 2022-12-30 MED ORDER — BUPROPION HCL ER (SR) 200 MG PO TB12
200.0000 mg | ORAL_TABLET | Freq: Every day | ORAL | 0 refills | Status: DC
  Filled 2022-12-30: qty 30, 30d supply, fill #0

## 2022-12-30 MED ORDER — DULOXETINE HCL 30 MG PO CPEP
30.0000 mg | ORAL_CAPSULE | Freq: Every day | ORAL | 0 refills | Status: DC
  Filled 2022-12-30: qty 1, 1d supply, fill #0
  Filled 2022-12-30: qty 14, 14d supply, fill #0

## 2022-12-30 NOTE — Progress Notes (Signed)
[place] Follow up visit   Patient Identification: Shannon Santiago MRN:  [id number] Date of Evaluation:  12/30/2022 Referral Source: [facility] discharge Chief Complaint:  follow up depression, grief Visit Diagnosis:    ICD-10-CM   1. MDD (major depressive disorder), recurrent, severe, with psychosis ([place])  F33.3     2. GAD (generalized anxiety disorder)  F41.1     3. Adjustment insomnia  F51.02      Virtual Visit via Video Note  I connected with Shannon Santiago on 12/30/22 at  4:00 PM EDT by a video enabled telemedicine application and verified that I am speaking with the correct person using two identifiers.  Location: Patient: home Provider: home office   I discussed the limitations of evaluation and management by telemedicine and the availability of in person appointments. The patient expressed understanding and agreed to proceed.      I discussed the assessment and treatment plan with the patient. The patient was provided an opportunity to ask questions and all were answered. The patient agreed with the plan and demonstrated an understanding of the instructions.   The patient was advised to call back or seek an in-person evaluation if the symptoms worsen or if the condition fails to improve as anticipated.  I provided 15 - 20  minutes of non-face-to-face time during this encounter.            History of Present Illness: Patient is a 57 years old currently single Caucasian female initially referred after hospital discharge January 21 she was admitted for 4 days. 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 Got gene testing done and reviewed with her, will change cymbalta or taper down and increase wellbutrin PCP started folate after gene testing enzymes review and says it has helped  Still gets lonely overall improved in some aspects as  well for anxiety    Has got her house but difficult to let go of her dad house with memories  Aggravating factors; grief of mom and now dad's death, recents dad's death Modifying factors: cat Duration since young age  Denies drug use Severity depression: anxiety better Duration more then one year Associated Signs/Symptoms:   Past Psychiatric History: depression, anxity  Previous Psychotropic Medications: Yes   Substance Abuse History in the last 12 months:  No.  Consequences of Substance Abuse: NA  Past Medical History:  Past Medical History:  Diagnosis Date   Allergy    Anxiety    Asthma    Depression    Family history of breast cancer    Family history of colon cancer    Family history of lung cancer    Family history of prostate cancer    Hyperlipidemia    Hypertension    Hypothyroidism    Prolonged PTT (partial thromboplastin time) 06/23/2019   Normal PT   Thyroid disease     Past Surgical History:  Procedure Laterality Date   APPENDECTOMY     ENDOMETRIAL BIOPSY  06/21/2019   EYE SURGERY     x 5   HYSTEROSCOPY WITH D & C N/A 10/27/2019   Procedure: DILATATION AND CURETTAGE /HYSTEROSCOPY;  Surgeon: Emily Filbert, MD;  Location: [facility];  Service: Gynecology;  Laterality: N/A;    Family Psychiatric History: mom depression, anxiety, alcohol use  Family History:  Family History  Problem Relation Age of Onset  Hypertension Mother    Stroke Mother    Lung cancer Mother 78   Anxiety disorder Mother    Depression Mother    Alcohol abuse Mother    Skin cancer Father    Prostate cancer Father 44   Hypertension Father    Diabetes Father    Thyroid disease Maternal Aunt    Thyroid disease Maternal Uncle    Diabetes Brother    Hypertension Brother    Colon cancer Maternal Grandmother        dx 4s, d. 43s   Prostate cancer Paternal Grandfather 65   Breast cancer Cousin        dx 20s-30s    Social History:   Social History    Socioeconomic History   Marital status: Single    Spouse name: Not on file   Number of children: Not on file   Years of education: Not on file   Highest education level: Not on file  Occupational History   Not on file  Tobacco Use   Smoking status: Never   Smokeless tobacco: Never  Vaping Use   Vaping Use: Never used  Substance and Sexual Activity   Alcohol use: Yes    Comment: occ   Drug use: Never   Sexual activity: Not Currently    Birth control/protection: Abstinence  Other Topics Concern   Not on file  Social History Narrative   Not on file   Social Determinants of Health   Financial Resource Strain: Not on file  Food Insecurity: Not on file  Transportation Needs: Not on file  Physical Activity: Not on file  Stress: Not on file  Social Connections: Not on file     Allergies:   Allergies  Allergen Reactions   Atorvastatin Other (See Comments)    Joint Pain   Lurasidone Other (See Comments)    Other reaction(s): hallucinations   Molds & Smuts Anaphylaxis    Other reaction(s): confusion, cough, eye swelling   Wheat     Other reaction(s): abdominal pain, confusion, edema, flushing   Morphine And Related Other (See Comments)    Numb hands and feet   Peanut Oil Other (See Comments)    Stomach cramping, hand/foot swelling   Penicillins Hives   Polymyxin B-Trimethoprim Other (See Comments), Photosensitivity and Swelling   Abilify [Aripiprazole] Other (See Comments)    Suicidal thoughts and actions   Lurasidone Hcl     Tardive dyskinesia, akathisia   Citalopram Other (See Comments)    SSRI's ineffective - Citalopram, Fluoxetine, Sertraline, Excitalopram    Food Nausea Only, Swelling, Palpitations, Other (See Comments) and Cough    WHEAT   Prednisone Anxiety and Other (See Comments)    Mood changes, worsening depression   Singulair [Montelukast Sodium] Other (See Comments)    Mood changes, worsening depression    Metabolic Disorder Labs: Lab Results   Component Value Date   HGBA1C 5.6 04/12/2021   MPG 114 04/12/2021   MPG 119.76 11/08/2020   Lab Results  Component Value Date   PROLACTIN 9.2 11/09/2020   PROLACTIN 10.6 06/18/2019   Lab Results  Component Value Date   CHOL 202 (H) 07/04/2022   TRIG 164 (H) 07/04/2022   HDL 49 (L) 07/04/2022   CHOLHDL 4.1 07/04/2022   VLDL 18 11/08/2020   LDLCALC 125 (H) 07/04/2022   LDLCALC 112 (H) 05/15/2022   Lab Results  Component Value Date   TSH 1.24 05/15/2022    Therapeutic Level Labs: No results found for: "  LITHIUM" No results found for: "CBMZ" No results found for: "VALPROATE"  Current Medications: Current Outpatient Medications  Medication Sig Dispense Refill   Albuterol Sulfate (PROAIR RESPICLICK) [id number] (90 Base) MCG/ACT AEPB Inhale 1-2 puffs into the lungs every 4 (four) hours as needed. 1 each 4   ALPRAZolam (XANAX) 0.5 MG tablet Take 0.5-1 tablets (0.25-0.5 mg total) by mouth 2 (two) times daily as needed for anxiety. 15 tablet 0   amLODipine (NORVASC) 5 MG tablet Take 1 tablet (5 mg total) by mouth daily. 90 tablet 1   Ascorbic Acid (VITAMIN C) 1000 MG tablet Take 1,000 mg by mouth daily.     b complex vitamins tablet Take 1 tablet by mouth daily.     buPROPion (WELLBUTRIN SR) 200 MG 12 hr tablet Take 1 tablet (200 mg total) by mouth daily. 30 tablet 0   busPIRone (BUSPAR) 15 MG tablet Take 1 tablet (15 mg total) by mouth 2 (two) times daily. Delete prior refill 180 tablet 0   CALCIUM PO Take 500 mg by mouth daily.     cetirizine (ZYRTEC) 10 MG tablet Take 10 mg by mouth daily.     Cholecalciferol (VITAMIN D3) 10 MCG (400 UNIT) tablet Take 400 Units by mouth daily.     DULoxetine (CYMBALTA) 30 MG capsule Take 1 capsule (30 mg total) by mouth daily. 15 capsule 0   EPINEPHrine 0.3 mg/0.3 mL IJ SOAJ injection Inject 0.3 mg into the muscle once as needed (anaphylaxis/allergic reaction). 2 each 3   Ferrous Sulfate (IRON PO) Take 325 mg by mouth daily.     fluticasone (FLONASE)  50 MCG/ACT nasal spray Place 1 spray into both nostrils daily. 16 g 5   Ginger, Zingiber officinalis, (GINGER ROOT PO) Take by mouth.     levothyroxine (SYNTHROID) 75 MCG tablet Take 1 tablet (75 mcg total) by mouth daily before breakfast. 90 tablet 1   lisinopril (ZESTRIL) 20 MG tablet Take 1 tablet (20 mg total) by mouth daily. 90 tablet 1   Magnesium 500 MG CAPS Take 500 mg by mouth daily.     Multiple Vitamin (MULTIVITAMIN) capsule Take 1 capsule by mouth daily.     Omega-3 Fatty Acids (FISH OIL) 1000 MG CAPS Take 1 capsule by mouth 2 (two) times daily.     polyethylene glycol (MIRALAX) 17 g packet Take the Miralax 17 G twice daily until Large BM then once daily. 30 each 6   Probiotic Product (PROBIOTIC ADVANCED PO) Take 1 capsule by mouth daily.     rosuvastatin (CRESTOR) 10 MG tablet Take 1 tablet (10 mg total) by mouth daily. 90 tablet 3   traZODone (DESYREL) 50 MG tablet Take 1 tablet (50 mg total) by mouth at bedtime. 30 tablet 0   No current facility-administered medications for this visit.     Psychiatric Specialty Exam: Review of Systems  Cardiovascular:  Negative for chest pain.  Psychiatric/Behavioral:  Positive for dysphoric mood. Negative for agitation and self-injury.     Last menstrual period 06/18/2017.There is no height or weight on file to calculate BMI.  General Appearance: Casual  Eye Contact:  Fair  Speech:  Clear and Coherent  Volume:  Normal  Mood: fair  Affect:  Congruent  Thought Process:  Goal Directed  Orientation:  Full (Time, Place, and Person)  Thought Content:  Rumination  Suicidal Thoughts:  No  Homicidal Thoughts:  No  Memory:  Immediate;   Fair Recent;   Fair  Judgement:  Fair  Insight:  Fair  Psychomotor Activity:  Normal  Concentration:  Concentration: Fair and Attention Span: Fair  Recall:  [place] of Knowledge:Good  Language: Good  Akathisia:  No  Handed:    AIMS (if indicated):  No involuntary movements  Assets:  Desire for  Improvement  ADL's:  Intact  Cognition: WNL  Sleep:   variable to fair   Screenings: AUDIT    Flowsheet Row Admission (Discharged) from 11/08/2020 in [facility]  Alcohol Use Disorder Identification Test Final Score (AUDIT) 3      GAD-7    Flowsheet Row Office Visit from 11/15/2022 in [place] at [facility] Visit from 07/04/2022 in [place] at [facility] Visit from 05/15/2022 in [place] at [facility] Video Visit from 09/11/2020 in [place] at [facility] Video Visit from 08/25/2020 in [place] at [facility]  Total GAD-7 Score '12 9 17 16 16      '$ PHQ2-9    [place] Office Visit from 11/15/2022 in [place] at [place] Visit from 07/04/2022 in [place] at [place] Visit from 05/15/2022 in [place] at [place] Visit from 08/17/2021 in [place] at [facility] Video Visit from 11/30/2020 in [place] at [facility]  PHQ-2 Total Score '6 3 4 2 3  '$ PHQ-9 Total Score '22 12 18 7 9      '$ Flowsheet Row Video Visit from 07/19/2022 in [place] at [facility] ED from 05/03/2022 in [facility] Emergency Department at [facility] Video Visit from 04/03/2022 in [place] at [place] No Risk No Risk No Risk       Assessment and Plan: as follows  Prior documentation reviewed   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

## 2023-01-24 ENCOUNTER — Ambulatory Visit: Payer: 59 | Attending: Cardiology | Admitting: Cardiology

## 2023-01-24 ENCOUNTER — Encounter: Payer: Self-pay | Admitting: Cardiology

## 2023-01-24 VITALS — BP 134/82 | HR 94 | Ht 61.0 in | Wt 168.2 lb

## 2023-01-24 DIAGNOSIS — I2510 Atherosclerotic heart disease of native coronary artery without angina pectoris: Secondary | ICD-10-CM | POA: Diagnosis not present

## 2023-01-24 DIAGNOSIS — I10 Essential (primary) hypertension: Secondary | ICD-10-CM | POA: Diagnosis not present

## 2023-01-24 DIAGNOSIS — E782 Mixed hyperlipidemia: Secondary | ICD-10-CM

## 2023-01-24 DIAGNOSIS — Z79899 Other long term (current) drug therapy: Secondary | ICD-10-CM

## 2023-01-24 DIAGNOSIS — Z131 Encounter for screening for diabetes mellitus: Secondary | ICD-10-CM | POA: Diagnosis not present

## 2023-01-24 NOTE — Progress Notes (Signed)
Cardiology Office Note:    Date:  01/24/2023   ID:  Shannon Santiago, DOB 09-28-1966, MRN [id number]  PCP:  Christen Butter, NP  Cardiologist:  Thomasene Ripple, DO  Electrophysiologist:  None   Referring MD: Christen Butter, NP   ' I am ok"   History of Present Illness:    Shannon Santiago is a 57 y.o. female with a hx of mild coronary artery disease, hypertension, hyperlipidemia, depression, anxiety is here today for follow-up visit.   At her last visit on 02/2021 at that time we discussed her new diagnosis of cad.  We also talked medication for coronary disease started on Lipitor.  She denies Lipitor has been transitioned to Crestor.  She offers no complaints at this time.  Since I saw the patient she has lost her father.   Past Medical History:  Diagnosis Date   Allergy    Anxiety    Asthma    Depression    Family history of breast cancer    Family history of colon cancer    Family history of lung cancer    Family history of prostate cancer    Hyperlipidemia    Hypertension    Hypothyroidism    Prolonged PTT (partial thromboplastin time) 06/23/2019   Normal PT   Thyroid disease     Past Surgical History:  Procedure Laterality Date   APPENDECTOMY     ENDOMETRIAL BIOPSY  06/21/2019   EYE SURGERY     x 5   HYSTEROSCOPY WITH D & C N/A 10/27/2019   Procedure: DILATATION AND CURETTAGE /HYSTEROSCOPY;  Surgeon: Allie Bossier, MD;  Location: [facility];  Service: Gynecology;  Laterality: N/A;    Current Medications: Current Meds  Medication Sig   Albuterol Sulfate (PROAIR RESPICLICK) 108 (90 Base) MCG/ACT AEPB Inhale 1-2 puffs into the lungs every 4 (four) hours as needed.   ALPRAZolam (XANAX) 0.5 MG tablet Take 0.5-1 tablets (0.25-0.5 mg total) by mouth 2 (two) times daily as needed for anxiety.   amLODipine (NORVASC) 5 MG tablet Take 1 tablet (5 mg total) by mouth daily.   Ascorbic Acid (VITAMIN C) 1000 MG tablet Take 1,000 mg by mouth daily.   b complex vitamins  tablet Take 1 tablet by mouth daily.   buPROPion (WELLBUTRIN SR) 200 MG 12 hr tablet Take 1 tablet (200 mg total) by mouth daily.   busPIRone (BUSPAR) 15 MG tablet Take 1 tablet (15 mg total) by mouth 2 (two) times daily. Delete prior refill   CALCIUM PO Take 500 mg by mouth daily.   cetirizine (ZYRTEC) 10 MG tablet Take 10 mg by mouth daily.   Cholecalciferol (VITAMIN D3) 10 MCG (400 UNIT) tablet Take 400 Units by mouth daily.   DULoxetine (CYMBALTA) 30 MG capsule Take 1 capsule (30 mg total) by mouth daily.   EPINEPHrine 0.3 mg/0.3 mL IJ SOAJ injection Inject 0.3 mg into the muscle once as needed (anaphylaxis/allergic reaction).   Ferrous Sulfate (IRON PO) Take 325 mg by mouth daily.   fluticasone (FLONASE) 50 MCG/ACT nasal spray Place 1 spray into both nostrils daily.   Ginger, Zingiber officinalis, (GINGER ROOT PO) Take by mouth.   levothyroxine (SYNTHROID) 75 MCG tablet Take 1 tablet (75 mcg total) by mouth daily before breakfast.   lisinopril (ZESTRIL) 20 MG tablet Take 1 tablet (20 mg total) by mouth daily.   Magnesium 500 MG CAPS Take 500 mg by mouth daily.   Multiple Vitamin (MULTIVITAMIN) capsule Take 1 capsule  by mouth daily.   Omega-3 Fatty Acids (FISH OIL) 1000 MG CAPS Take 1 capsule by mouth 2 (two) times daily.   polyethylene glycol (MIRALAX) 17 g packet Take the Miralax 17 G twice daily until Large BM then once daily.   Probiotic Product (PROBIOTIC ADVANCED PO) Take 1 capsule by mouth daily.   rosuvastatin (CRESTOR) 10 MG tablet Take 1 tablet (10 mg total) by mouth daily.   traZODone (DESYREL) 50 MG tablet Take 1 tablet (50 mg total) by mouth at bedtime.     Allergies:   Atorvastatin, Lurasidone, Molds & smuts, Wheat, Morphine and related, Peanut oil, Penicillins, Polymyxin b-trimethoprim, Abilify [aripiprazole], Lurasidone hcl, Citalopram, Food, Prednisone, and Singulair [montelukast sodium]   Social History   Socioeconomic History   Marital status: Single    Spouse name:  Not on file   Number of children: Not on file   Years of education: Not on file   Highest education level: Not on file  Occupational History   Not on file  Tobacco Use   Smoking status: Never   Smokeless tobacco: Never  Vaping Use   Vaping Use: Never used  Substance and Sexual Activity   Alcohol use: Yes    Comment: occ   Drug use: Never   Sexual activity: Not Currently    Birth control/protection: Abstinence  Other Topics Concern   Not on file  Social History Narrative   Not on file   Social Determinants of Health   Financial Resource Strain: Not on file  Food Insecurity: Not on file  Transportation Needs: Not on file  Physical Activity: Not on file  Stress: Not on file  Social Connections: Not on file     Family History: The patient's family history includes Alcohol abuse in her mother; Anxiety disorder in her mother; Breast cancer in her cousin; Colon cancer in her maternal grandmother; Depression in her mother; Diabetes in her brother and father; Hypertension in her brother, father, and mother; Lung cancer (age of onset: 3374) in her mother; Prostate cancer (age of onset: 5168) in her father; Prostate cancer (age of onset: 5170) in her paternal grandfather; Skin cancer in her father; Stroke in her mother; Thyroid disease in her maternal aunt and maternal uncle.  ROS:   Review of Systems  Constitution: Negative for decreased appetite, fever and weight gain.  HENT: Negative for congestion, ear discharge, hoarse voice and sore throat.   Eyes: Negative for discharge, redness, vision loss in right eye and visual halos.  Cardiovascular: Negative for chest pain, dyspnea on exertion, leg swelling, orthopnea and palpitations.  Respiratory: Negative for cough, hemoptysis, shortness of breath and snoring.   Endocrine: Negative for heat intolerance and polyphagia.  Hematologic/Lymphatic: Negative for bleeding problem. Does not bruise/bleed easily.  Skin: Negative for flushing, nail  changes, rash and suspicious lesions.  Musculoskeletal: Negative for arthritis, joint pain, muscle cramps, myalgias, neck pain and stiffness.  Gastrointestinal: Negative for abdominal pain, bowel incontinence, diarrhea and excessive appetite.  Genitourinary: Negative for decreased libido, genital sores and incomplete emptying.  Neurological: Negative for brief paralysis, focal weakness, headaches and loss of balance.  Psychiatric/Behavioral: Negative for altered mental status, depression and suicidal ideas.  Allergic/Immunologic: Negative for HIV exposure and persistent infections.    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  Coronary CTA March 2022 Aorta:  Normal size.  No calcifications.  No dissection.   Aortic Valve:  Tri-leaflet.  No calcifications.   Coronary Arteries:  Normal coronary origin.  Co-dominance.   Coronary calcium score of 34. This was 90th percentile for age, sex, and race matched control.   RCA is a large co-dominant artery that gives rise to PDA and PLA. There is a mild non-obstructive (25-49%) calcified plaque in the proximal vessel. There is a mild non-obstructive (25-49%) calcified plaque in the mid vessel.   Left main is a large artery that gives rise to LAD and LCX arteries. There is no plaque.   LAD is a large vessel that gives rise to one large D1 Branch that bifurcates. There is a minimal non-obstructive (1-24%) calcified plaque in the proximal vessel. There is a minimal non-obstructive (1-24%) calcified plaque in the mid vessel. There is a mild non-obstructive (25-49%) mixed plaque in the ostium of the D1 vessel- the Glagov phenomenon is noted. There is a mild non-obstructive plaque (25-49%) calcified plaque in the ostium of the D1 distal bifurcation.   LCX is a  co-dominant artery that gives rise to one large OM1 branch. There is no plaque.   Other findings:   Normal pulmonary vein drainage into the left atrium.   Normal left atrial appendage without a thrombus.   Normal size of the pulmonary artery.   Extra-cardiac findings: See attached radiology report for non-cardiac structures.   IMPRESSION: 1. Coronary calcium score of 34. This was 90th percentile for age, sex, and race matched control.   2. Normal coronary origin.  Co-dominance.   3. CAD-RADS 2. Mild non-obstructive CAD (25-49%). Consider non-atherosclerotic causes of chest pain. Consider preventive therapy and risk factor modification. CT-FFR sent based off of Ostial D1 remodeling.   September 2020 Transthoracic echocardiogram IMPRESSIONS   1. The left ventricle has normal systolic function with an ejection  fraction of 60-65%. The cavity size was normal. Left ventricular diastolic  Doppler parameters are consistent with impaired relaxation.   2. The right ventricle has normal systolic function. The cavity was  normal. There is no increase in right ventricular wall thickness.   3. The aorta is normal unless otherwise noted.   FINDINGS   Left Ventricle: The left ventricle has normal systolic function, with an  ejection fraction of 60-65%. The cavity size was normal. There is no  increase in left ventricular wall thickness. Left ventricular diastolic  Doppler parameters are consistent with  impaired relaxation.   Right Ventricle: The right ventricle has normal systolic function. The  cavity was normal. There is no increase in right ventricular wall  thickness.   Left Atrium: Left atrial size was normal in size.   Right Atrium: Right atrial size was normal in size. Right atrial pressure  is estimated at 3 mmHg.   Interatrial Septum: No atrial level shunt detected by color flow Doppler.   Pericardium: There is no evidence of pericardial effusion.   Mitral Valve: The  mitral valve is normal in structure. Mitral valve  regurgitation is trivial by color flow Doppler.   Tricuspid Valve: The tricuspid valve is normal in structure. Tricuspid  valve regurgitation is trivial by color flow Doppler.   Aortic Valve: The aortic valve is normal in structure. Aortic valve  regurgitation was not assessed by color flow Doppler.   Pulmonic Valve: The pulmonic valve was normal in structure. Pulmonic valve  regurgitation was not assessed by color flow Doppler.   Aorta: The aorta is normal unless otherwise  noted.   Venous: The inferior vena cava measures 2.18 cm, is normal in size with  greater than 50% respiratory variability.   Recent Labs: 05/15/2022: TSH 1.24 07/04/2022: ALT 27; BUN 20; Creat 0.83; Hemoglobin 13.9; Platelets 413; Potassium 4.3; Sodium 139  Recent Lipid Panel    Component Value Date/Time   CHOL 202 (H) 07/04/2022 0000   CHOL 265 (H) 09/09/2019 1141   TRIG 164 (H) 07/04/2022 0000   HDL 49 (L) 07/04/2022 0000   HDL 43 09/09/2019 1141   CHOLHDL 4.1 07/04/2022 0000   VLDL 18 11/08/2020 1753   LDLCALC 125 (H) 07/04/2022 0000    Physical Exam:    VS:  BP 134/82   Pulse 94   Ht 5\' 1"  (1.549 m)   Wt 168 lb 3.2 oz (76.3 kg)   LMP 06/18/2017   SpO2 96%   BMI 31.78 kg/m     Wt Readings from Last 3 Encounters:  01/24/23 168 lb 3.2 oz (76.3 kg)  11/15/22 176 lb (79.8 kg)  07/04/22 177 lb 4.8 oz (80.4 kg)     GEN: Well nourished, well developed in no acute distress HEENT: Normal NECK: No JVD; No carotid bruits LYMPHATICS: No lymphadenopathy CARDIAC: S1S2 noted,RRR, no murmurs, rubs, gallops RESPIRATORY:  Clear to auscultation without rales, wheezing or rhonchi  ABDOMEN: Soft, non-tender, non-distended, +bowel sounds, no guarding. EXTREMITIES: No edema, No cyanosis, no clubbing MUSCULOSKELETAL:  No deformity  SKIN: Warm and dry NEUROLOGIC:  Alert and oriented x 3, non-focal PSYCHIATRIC:  Normal affect, good insight  ASSESSMENT:     1. Screening for diabetes mellitus (DM)   2. Mixed hyperlipidemia   3. Mild CAD   4. Hypertension, unspecified type   5. Medication management    PLAN:    No angina symptoms.  Will continue the patient on her current dose of Lipitor as well as her aspirin.  Her last lipid profile was in September 2023 showing total cholesterol 202, HDL 49, LDL 125 and triglyceride 164.  Will go ahead and repeat this lipid testing if is greater than 74 adjust her lipid-lowering agents.  She is agreeable for this.  Blood pressure is acceptable, continue with current antihypertensive regimen.  Will screen for diabetes as well.  The patient understands the need to lose weight with diet and exercise. We have discussed specific strategies for this.  The patient is in agreement with the above plan. The patient left the office in stable condition.  The patient will follow up in   Medication Adjustments/Labs and Tests Ordered: Current medicines are reviewed at length with the patient today.  Concerns regarding medicines are outlined above.  Orders Placed This Encounter  Procedures   Lipid panel   Comprehensive Metabolic Panel (CMET)   Hemoglobin A1c   EKG 12-Lead   No orders of the defined types were placed in this encounter.   Patient Instructions  Medication Instructions:  Your physician recommends that you continue on your current medications as directed. Please refer to the Current Medication list given to you today.  *If you need a refill on your cardiac medications before your next appointment, please call your pharmacy*   Lab Work: Your physician recommends that you have labs drawn today: Lipids, HgbA1c If you have labs (blood work) drawn today and your tests are completely normal, you will receive your results only by: MyChart Message (if you have MyChart) OR A paper copy in the mail If you have any lab test that is abnormal or we  need to change your treatment, we will call you to review  the results.   Testing/Procedures: None   Follow-Up: At [facility], you and your health needs are our priority.  As part of our continuing mission to provide you with exceptional heart care, we have created designated Provider Care Teams.  These Care Teams include your primary Cardiologist (physician) and Advanced Practice Providers (APPs -  Physician Assistants and Nurse Practitioners) who all work together to provide you with the care you need, when you need it.   Your next appointment:   1 year(s)  Provider:   Thomasene Ripple, DO    Adopting a Healthy Lifestyle.  Know what a healthy weight is for you (roughly BMI <25) and aim to maintain this   Aim for 7+ servings of fruits and vegetables daily   65-80+ fluid ounces of water or unsweet tea for healthy kidneys   Limit to max 1 drink of alcohol per day; avoid smoking/tobacco   Limit animal fats in diet for cholesterol and heart health - choose grass fed whenever available   Avoid highly processed foods, and foods high in saturated/trans fats   Aim for low stress - take time to unwind and care for your mental health   Aim for 150 min of moderate intensity exercise weekly for heart health, and weights twice weekly for bone health   Aim for 7-9 hours of sleep daily   When it comes to diets, agreement about the perfect plan isnt easy to find, even among the experts. Experts at the [facility] [employer] developed an idea known as the Healthy Eating Plate. Just imagine a plate divided into logical, healthy portions.   The emphasis is on diet quality:   Load up on vegetables and fruits - one-half of your plate: Aim for color and variety, and remember that potatoes dont count.   Go for whole grains - one-quarter of your plate: Whole wheat, barley, wheat berries, quinoa, oats, brown rice, and foods made with them. If you want pasta, go with whole wheat pasta.   Protein power - one-quarter of your plate: Fish,  chicken, beans, and nuts are all healthy, versatile protein sources. Limit red meat.   The diet, however, does go beyond the plate, offering a few other suggestions.   Use healthy plant oils, such as olive, canola, soy, corn, sunflower and peanut. Check the labels, and avoid partially hydrogenated oil, which have unhealthy trans fats.   If youre thirsty, drink water. Coffee and tea are good in moderation, but skip sugary drinks and limit milk and dairy products to one or two daily servings.   The type of carbohydrate in the diet is more important than the amount. Some sources of carbohydrates, such as vegetables, fruits, whole grains, and beans-are healthier than others.   Finally, stay active  Signed, Thomasene Ripple, DO  01/24/2023 9:01 PM    [facility]

## 2023-01-24 NOTE — Patient Instructions (Signed)
Medication Instructions:  Your physician recommends that you continue on your current medications as directed. Please refer to the Current Medication list given to you today.  *If you need a refill on your cardiac medications before your next appointment, please call your pharmacy*   Lab Work: Your physician recommends that you have labs drawn today: Lipids, HgbA1c If you have labs (blood work) drawn today and your tests are completely normal, you will receive your results only by: MyChart Message (if you have MyChart) OR A paper copy in the mail If you have any lab test that is abnormal or we need to change your treatment, we will call you to review the results.   Testing/Procedures: None   Follow-Up: At [facility], you and your health needs are our priority.  As part of our continuing mission to provide you with exceptional heart care, we have created designated Provider Care Teams.  These Care Teams include your primary Cardiologist (physician) and Advanced Practice Providers (APPs -  Physician Assistants and Nurse Practitioners) who all work together to provide you with the care you need, when you need it.   Your next appointment:   1 year(s)  Provider:   Thomasene Ripple, DO

## 2023-01-25 LAB — COMPREHENSIVE METABOLIC PANEL
ALT: 36 IU/L — ABNORMAL HIGH (ref 0–32)
AST: 28 IU/L (ref 0–40)
Albumin/Globulin Ratio: 1.6 (ref 1.2–2.2)
Albumin: 4.7 g/dL (ref 3.8–4.9)
Alkaline Phosphatase: 91 IU/L (ref 44–121)
BUN/Creatinine Ratio: 15 (ref 9–23)
BUN: 15 mg/dL (ref 6–24)
Bilirubin Total: 0.4 mg/dL (ref 0.0–1.2)
CO2: 23 mmol/L (ref 20–29)
Calcium: 9.9 mg/dL (ref 8.7–10.2)
Chloride: 103 mmol/L (ref 96–106)
Creatinine, Ser: 0.98 mg/dL (ref 0.57–1.00)
Globulin, Total: 2.9 g/dL (ref 1.5–4.5)
Glucose: 80 mg/dL (ref 70–99)
Potassium: 5 mmol/L (ref 3.5–5.2)
Sodium: 142 mmol/L (ref 134–144)
Total Protein: 7.6 g/dL (ref 6.0–8.5)
eGFR: 67 mL/min/{1.73_m2} (ref >59)

## 2023-01-25 LAB — LIPID PANEL
Chol/HDL Ratio: 5.3 ratio — ABNORMAL HIGH (ref 0.0–4.4)
Cholesterol, Total: 221 mg/dL — ABNORMAL HIGH (ref 100–199)
HDL: 42 mg/dL (ref >39)
LDL Chol Calc (NIH): 148 mg/dL — ABNORMAL HIGH (ref 0–99)
Triglycerides: 171 mg/dL — ABNORMAL HIGH (ref 0–149)
VLDL Cholesterol Cal: 31 mg/dL (ref 5–40)

## 2023-01-25 LAB — HEMOGLOBIN A1C
Est. average glucose Bld gHb Est-mCnc: 120 mg/dL
Hgb A1c MFr Bld: 5.8 % — ABNORMAL HIGH (ref 4.8–5.6)

## 2023-01-27 ENCOUNTER — Other Ambulatory Visit (HOSPITAL_COMMUNITY): Payer: Self-pay | Admitting: Psychiatry

## 2023-01-27 ENCOUNTER — Other Ambulatory Visit (HOSPITAL_BASED_OUTPATIENT_CLINIC_OR_DEPARTMENT_OTHER): Payer: Self-pay

## 2023-01-27 MED ORDER — BUPROPION HCL ER (SR) 200 MG PO TB12
200.0000 mg | ORAL_TABLET | Freq: Every day | ORAL | 0 refills | Status: DC
  Filled 2023-02-05 (×2): qty 30, 30d supply, fill #0

## 2023-01-30 ENCOUNTER — Other Ambulatory Visit (HOSPITAL_BASED_OUTPATIENT_CLINIC_OR_DEPARTMENT_OTHER): Payer: Self-pay

## 2023-01-30 ENCOUNTER — Other Ambulatory Visit: Payer: Self-pay

## 2023-01-30 MED ORDER — ROSUVASTATIN CALCIUM 20 MG PO TABS
20.0000 mg | ORAL_TABLET | Freq: Every day | ORAL | 3 refills | Status: DC
  Filled 2023-01-30: qty 90, 90d supply, fill #0
  Filled 2023-05-01: qty 90, 90d supply, fill #1
  Filled 2023-07-30: qty 90, 90d supply, fill #2
  Filled 2023-10-28: qty 90, 90d supply, fill #3

## 2023-01-30 NOTE — Progress Notes (Signed)
Prescription sent to pharmacy per Dr. Mallory Shirk results.

## 2023-02-03 ENCOUNTER — Encounter: Payer: Self-pay | Admitting: *Deleted

## 2023-02-04 ENCOUNTER — Other Ambulatory Visit (HOSPITAL_BASED_OUTPATIENT_CLINIC_OR_DEPARTMENT_OTHER): Payer: Self-pay

## 2023-02-05 ENCOUNTER — Other Ambulatory Visit: Payer: Self-pay

## 2023-02-05 ENCOUNTER — Other Ambulatory Visit (HOSPITAL_BASED_OUTPATIENT_CLINIC_OR_DEPARTMENT_OTHER): Payer: Self-pay

## 2023-02-17 ENCOUNTER — Ambulatory Visit (INDEPENDENT_AMBULATORY_CARE_PROVIDER_SITE_OTHER): Payer: 59 | Admitting: Physician Assistant

## 2023-02-17 ENCOUNTER — Encounter: Payer: Self-pay | Admitting: Physician Assistant

## 2023-02-17 ENCOUNTER — Other Ambulatory Visit (HOSPITAL_BASED_OUTPATIENT_CLINIC_OR_DEPARTMENT_OTHER): Payer: Self-pay

## 2023-02-17 VITALS — BP 127/82 | HR 92 | Temp 97.6°F

## 2023-02-17 DIAGNOSIS — H6501 Acute serous otitis media, right ear: Secondary | ICD-10-CM

## 2023-02-17 DIAGNOSIS — S00411A Abrasion of right ear, initial encounter: Secondary | ICD-10-CM

## 2023-02-17 DIAGNOSIS — H6121 Impacted cerumen, right ear: Secondary | ICD-10-CM | POA: Diagnosis not present

## 2023-02-17 DIAGNOSIS — H9201 Otalgia, right ear: Secondary | ICD-10-CM | POA: Diagnosis not present

## 2023-02-17 MED ORDER — OFLOXACIN 0.3 % OT SOLN
10.0000 [drp] | Freq: Every day | OTIC | 0 refills | Status: DC
Start: 2023-02-17 — End: 2023-04-22
  Filled 2023-02-17: qty 5, 7d supply, fill #0

## 2023-02-17 MED ORDER — CEFDINIR 300 MG PO CAPS
300.0000 mg | ORAL_CAPSULE | Freq: Two times a day (BID) | ORAL | 0 refills | Status: DC
Start: 2023-02-17 — End: 2023-04-22
  Filled 2023-02-17: qty 20, 10d supply, fill #0

## 2023-02-17 NOTE — Progress Notes (Signed)
Acute Office Visit  Subjective:     Patient ID: Shannon Santiago, female    DOB: 1966/03/07, 57 y.o.   MRN: [id number]  No chief complaint on file.   HPI Patient is in today for right ear pain, hearing loss, fullness since Monday of last week. Symptoms worsened on Thursday and started having more right ear pain, right facial pain, pain behind right eye. Hx of cerumen impaction and she has been trying to get cerumen loose with debrox and not helping. Apple cider and alcohol drops did help some. No fever, chills, body aches. She has an intermittent allergic cough. No SOB or wheezing.   .. Active Ambulatory Problems    Diagnosis Date Noted   Chronic headaches 11/24/2013   DDD (degenerative disc disease), lumbosacral 12/10/2013   Hashimoto's thyroiditis 11/30/2018   Mixed hyperlipidemia 05/04/2015   Hypertension goal BP (blood pressure) < 130/80 11/24/2013   Major depressive disorder, recurrent, severe without psychotic features (HCC) 06/10/2014   Moderate persistent asthma without complication 09/28/2014   Postmenopausal 11/30/2018   Moderate episode of recurrent major depressive disorder (HCC) 11/30/2018   Fasting hyperglycemia 05/03/2019   Plantar fasciitis, right 06/03/2019   Prolonged PTT (partial thromboplastin time) 06/23/2019   Past heart attack 06/28/2015   Family history of breast cancer    Family history of lung cancer    Family history of colon cancer    Family history of prostate cancer    Premature atrial complexes 09/10/2019   Genetic testing 09/15/2019   MDD (major depressive disorder), recurrent, severe, with psychosis (HCC) 11/08/2020   Thyroid disease    Hypothyroidism    Hyperlipidemia    Hypertension    Depression    Asthma    Anxiety    Allergy    Mild CAD 03/05/2021   Obesity (BMI 30-39.9) 03/05/2021   Prediabetes 03/05/2021   Status post laparoscopic appendectomy 07/06/2021   Acute appendicitis with perforation and localized peritonitis, without  abscess 06/28/2021   Cervical radiculopathy 05/31/2022   Neck pain 05/13/2022   Resolved Ambulatory Problems    Diagnosis Date Noted   Statin declined 11/30/2018   Severe recurrent major depression without psychotic features (HCC) 11/08/2020   No Additional Past Medical History      ROS  See HPI.     Objective:    BP 127/82 (BP Location: Left Arm, Patient Position: Sitting, Cuff Size: Normal)   Pulse 92   Temp 97.6 F (36.4 C) (Oral)   LMP 06/18/2017  BP Readings from Last 3 Encounters:  02/17/23 127/82  01/24/23 134/82  11/15/22 103/70   Wt Readings from Last 3 Encounters:  01/24/23 168 lb 3.2 oz (76.3 kg)  11/15/22 176 lb (79.8 kg)  07/04/22 177 lb 4.8 oz (80.4 kg)      Physical Exam Constitutional:      Appearance: Normal appearance. She is obese.  HENT:     Head: Normocephalic.     Ears:     Comments: Scant cerumen in left ear but can visualize normal TM.  Cerumen impaction of right ear with purulent discharge, erythematous canal with evidence of abrasion in right canal.  Tenderness over tragus and pulling up on auricle.  Mild tenderness over right mastoid Right maxillary sinus tenderness Cardiovascular:     Rate and Rhythm: Normal rate and regular rhythm.     Pulses: Normal pulses.     Heart sounds: Normal heart sounds.  Pulmonary:     Effort: Pulmonary effort is normal.  Breath sounds: Normal breath sounds.  Musculoskeletal:     Right lower leg: No edema.     Left lower leg: No edema.  Neurological:     General: No focal deficit present.     Mental Status: She is alert and oriented to person, place, and time.  Psychiatric:        Mood and Affect: Mood normal.         Assessment & Plan:  Marland KitchenMarland KitchenDiagnoses and all orders for this visit:  Non-recurrent acute serous otitis media of right ear -     cefdinir (OMNICEF) 300 MG capsule; Take 1 capsule (300 mg total) by mouth 2 (two) times daily. For 10 days. -     ofloxacin (FLOXIN) 0.3 % OTIC  solution; Place 10 drops into the right ear daily for 7 days.  Right ear pain  Ear canal abrasion, right, initial encounter -     ofloxacin (FLOXIN) 0.3 % OTIC solution; Place 10 drops into the right ear daily for 7 days.  Impacted cerumen of right ear   Pt does have cerumen impaction with evidence of purulent drainage and abrasion in right ear Allergic to PCN Sent omnicef for 10 days Sent olfoxacin drops for 7 days Follow up in 7-10 days for ear recheck and to make sure cerumen resolved or attempt irrigation   Tandy Gaw, PA-C

## 2023-02-17 NOTE — Patient Instructions (Signed)

## 2023-02-24 ENCOUNTER — Encounter: Payer: Self-pay | Admitting: Medical-Surgical

## 2023-02-24 ENCOUNTER — Ambulatory Visit (INDEPENDENT_AMBULATORY_CARE_PROVIDER_SITE_OTHER): Payer: 59 | Admitting: Medical-Surgical

## 2023-02-24 VITALS — BP 117/77 | HR 91 | Resp 20 | Ht 61.0 in | Wt 173.3 lb

## 2023-02-24 DIAGNOSIS — H6501 Acute serous otitis media, right ear: Secondary | ICD-10-CM

## 2023-02-24 DIAGNOSIS — H6123 Impacted cerumen, bilateral: Secondary | ICD-10-CM | POA: Diagnosis not present

## 2023-02-24 NOTE — Progress Notes (Signed)
        Established patient visit  History, exam, impression, and plan:  1. Non-recurrent acute serous otitis media of right ear Very pleasant 57 year old female presenting today for follow-up after treatment of right ear otitis media.  Completed Omnicef as prescribed and notes that the ear pain has resolved.  Used the eardrops for a brief period but was unable to complete the course due to the impacted cerumen.  2. Bilateral impacted cerumen On exam, bilateral TMs not visible due to cerumen impaction.  Large amount of dark brown cerumen present in the ear canals bilaterally.  Bilateral ear irrigation completed.  On reexam, bilateral TMs visualized, normal.  External ear canals slightly erythematous from irrigation procedure.  See procedure note below.  Procedures performed this visit: Indication: Cerumen impaction of the ear(s)  Medical necessity statement: On physical examination, cerumen impairs clinically significant portions of the external auditory canal, and tympanic membrane. Noted obstructive, copious cerumen that cannot be removed without magnification and instrumentations. Consent: Discussed benefits and risks of procedure and verbal consent obtained Procedure: Patient was prepped for the procedure. Utilized an otoscope to assess and take note of the ear canal, the tympanic membrane, and the presence, amount, and placement of the cerumen. Gentle water irrigation and soft plastic curette was utilized to remove cerumen.  Post procedure examination: shows cerumen was completely removed. Patient tolerated procedure well. The patient is made aware that they may experience temporary vertigo, temporary hearing loss, and temporary discomfort. If these symptom last for more than 24 hours to call the clinic or proceed to the ED.   Return if symptoms worsen or fail to improve.  __________________________________ Thayer Ohm, DNP, APRN, FNP-BC Primary Care and Sports Medicine [facility] [place]

## 2023-02-28 ENCOUNTER — Other Ambulatory Visit (HOSPITAL_COMMUNITY): Payer: Self-pay | Admitting: Psychiatry

## 2023-02-28 ENCOUNTER — Other Ambulatory Visit: Payer: Self-pay

## 2023-03-03 ENCOUNTER — Encounter: Payer: Self-pay | Admitting: Pharmacist

## 2023-03-03 ENCOUNTER — Other Ambulatory Visit: Payer: Self-pay

## 2023-03-03 ENCOUNTER — Other Ambulatory Visit (HOSPITAL_COMMUNITY): Payer: Self-pay

## 2023-03-03 MED ORDER — BUPROPION HCL ER (SR) 200 MG PO TB12
200.0000 mg | ORAL_TABLET | Freq: Every day | ORAL | 0 refills | Status: DC
  Filled 2023-03-03 (×2): qty 30, 30d supply, fill #0

## 2023-03-24 ENCOUNTER — Other Ambulatory Visit (HOSPITAL_BASED_OUTPATIENT_CLINIC_OR_DEPARTMENT_OTHER): Payer: Self-pay

## 2023-03-24 ENCOUNTER — Encounter (HOSPITAL_COMMUNITY): Payer: Self-pay | Admitting: Psychiatry

## 2023-03-24 ENCOUNTER — Telehealth (INDEPENDENT_AMBULATORY_CARE_PROVIDER_SITE_OTHER): Payer: 59 | Admitting: Psychiatry

## 2023-03-24 DIAGNOSIS — F5102 Adjustment insomnia: Secondary | ICD-10-CM

## 2023-03-24 DIAGNOSIS — F333 Major depressive disorder, recurrent, severe with psychotic symptoms: Secondary | ICD-10-CM | POA: Diagnosis not present

## 2023-03-24 DIAGNOSIS — F411 Generalized anxiety disorder: Secondary | ICD-10-CM

## 2023-03-24 MED ORDER — BUPROPION HCL ER (SR) 150 MG PO TB12
150.0000 mg | ORAL_TABLET | Freq: Every day | ORAL | 1 refills | Status: DC
  Filled 2023-03-24: qty 5, 5d supply, fill #0
  Filled 2023-03-24: qty 30, 30d supply, fill #0
  Filled 2023-03-24: qty 25, 25d supply, fill #0
  Filled 2023-04-16: qty 30, 30d supply, fill #1

## 2023-03-24 MED ORDER — VENLAFAXINE HCL ER 37.5 MG PO CP24
37.5000 mg | ORAL_CAPSULE | Freq: Every day | ORAL | 1 refills | Status: DC
  Filled 2023-03-24: qty 30, 30d supply, fill #0
  Filled 2023-04-16: qty 30, 30d supply, fill #1

## 2023-03-24 NOTE — Progress Notes (Signed)
[facility] Follow up visit   Patient Identification: Shannon Santiago MRN:  [id number] Date of Evaluation:  03/24/2023 Referral Source: [facility] discharge Chief Complaint:  follow up depression, grief Visit Diagnosis:    ICD-10-CM   1. MDD (major depressive disorder), recurrent, severe, with psychosis (HCC)  F33.3     2. GAD (generalized anxiety disorder)  F41.1     3. Adjustment insomnia  F51.02      Virtual Visit via Video Note  I connected with Shannon Santiago on 03/24/23 at  2:00 PM EDT by a video enabled telemedicine application and verified that I am speaking with the correct person using two identifiers.  Location: Patient: home Provider: home office   I discussed the limitations of evaluation and management by telemedicine and the availability of in person appointments. The patient expressed understanding and agreed to proceed.     I discussed the assessment and treatment plan with the patient. The patient was provided an opportunity to ask questions and all were answered. The patient agreed with the plan and demonstrated an understanding of the instructions.   The patient was advised to call back or seek an in-person evaluation if the symptoms worsen or if the condition fails to improve as anticipated.  I provided 20 minutes of non-face-to-face time during this encounter.     History of Present Illness: Patient is a 57  years old currently single Caucasian female initially referred after hospital discharge January 21 she was admitted for 4 days. 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    PCP has started folate after gene testing enzymes review and says it has helped Increase wellbutrin did help depression but feeling somewhat agitated easily Not living in a good neighbourhood, feels frustrated of that as well  Can't move, also gets lonely Reviewed Gene testing meds again    Has got her house but difficult to let go of her dad house  with memories  Aggravating factors; grief of mom and now dad's death, recents dad's death Modifying factors: cat Duration since young age  Denies drug use Severity depression: gets frustrated, angry  Duration more then one year Associated Signs/Symptoms:   Past Psychiatric History: depression, anxity  Previous Psychotropic Medications: Yes   Substance Abuse History in the last 12 months:  No.  Consequences of Substance Abuse: NA  Past Medical History:  Past Medical History:  Diagnosis Date   Allergy    Anxiety    Asthma    Depression    Family history of breast cancer    Family history of colon cancer    Family history of lung cancer    Family history of prostate cancer    Hyperlipidemia    Hypertension    Hypothyroidism    Prolonged PTT (partial thromboplastin time) 06/23/2019   Normal PT   Thyroid disease     Past Surgical History:  Procedure Laterality Date   APPENDECTOMY     ENDOMETRIAL BIOPSY  06/21/2019   EYE SURGERY     x 5   HYSTEROSCOPY WITH D & C N/A 10/27/2019   Procedure: DILATATION AND CURETTAGE /HYSTEROSCOPY;  Surgeon: Allie Bossier, MD;  Location: [facility];  Service: Gynecology;  Laterality: N/A;    Family Psychiatric History: mom depression, anxiety, alcohol use  Family History:  Family History  Problem Relation Age of Onset   Hypertension Mother    Stroke Mother    Lung cancer Mother 42   Anxiety disorder Mother  Depression Mother    Alcohol abuse Mother    Skin cancer Father    Prostate cancer Father 9   Hypertension Father    Diabetes Father    Thyroid disease Maternal Aunt    Thyroid disease Maternal Uncle    Diabetes Brother    Hypertension Brother    Colon cancer Maternal Grandmother        dx 64s, d. 19s   Prostate cancer Paternal Grandfather 60   Breast cancer Cousin        dx 20s-30s    Social History:   Social History   Socioeconomic History   Marital status: Single    Spouse name: Not on file    Number of children: Not on file   Years of education: Not on file   Highest education level: Associate degree: academic program  Occupational History   Not on file  Tobacco Use   Smoking status: Never   Smokeless tobacco: Never  Vaping Use   Vaping Use: Never used  Substance and Sexual Activity   Alcohol use: Yes    Comment: occ   Drug use: Never   Sexual activity: Not Currently    Birth control/protection: Abstinence  Other Topics Concern   Not on file  Social History Narrative   Not on file   Social Determinants of Health   Financial Resource Strain: Low Risk  (02/13/2023)   Overall Financial Resource Strain (CARDIA)    Difficulty of Paying Living Expenses: Not very hard  Food Insecurity: No Food Insecurity (02/13/2023)   Hunger Vital Sign    Worried About Running Out of Food in the Last Year: Never true    Ran Out of Food in the Last Year: Never true  Transportation Needs: No Transportation Needs (02/13/2023)   PRAPARE - Administrator, Civil Service (Medical): No    Lack of Transportation (Non-Medical): No  Physical Activity: Insufficiently Active (02/13/2023)   Exercise Vital Sign    Days of Exercise per Week: 1 day    Minutes of Exercise per Session: 20 min  Stress: Stress Concern Present (02/13/2023)   [employer] of Occupational Health - Occupational Stress Questionnaire    Feeling of Stress : To some extent  Social Connections: Moderately Isolated (02/13/2023)   Social Connection and Isolation Panel [NHANES]    Frequency of Communication with Friends and Family: Once a week    Frequency of Social Gatherings with Friends and Family: Twice a week    Attends Religious Services: 1 to 4 times per year    Active Member of [employer] or Organizations: No    Attends Engineer, structural: Not on file    Marital Status: Never married     Allergies:   Allergies  Allergen Reactions   Atorvastatin Other (See Comments)    Joint Pain   Lurasidone  Other (See Comments)    Other reaction(s): hallucinations   Molds & Smuts Anaphylaxis    Other reaction(s): confusion, cough, eye swelling   Wheat     Other reaction(s): abdominal pain, confusion, edema, flushing   Morphine And Codeine Other (See Comments)    Numb hands and feet   Peanut Oil Other (See Comments)    Stomach cramping, hand/foot swelling   Penicillins Hives   Polymyxin B-Trimethoprim Other (See Comments), Photosensitivity and Swelling   Abilify [Aripiprazole] Other (See Comments)    Suicidal thoughts and actions   Lurasidone Hcl     Tardive dyskinesia, akathisia   Citalopram  Other (See Comments)    SSRI's ineffective - Citalopram, Fluoxetine, Sertraline, Excitalopram    Food Nausea Only, Swelling, Palpitations, Other (See Comments) and Cough    WHEAT   Prednisone Anxiety and Other (See Comments)    Mood changes, worsening depression   Singulair [Montelukast Sodium] Other (See Comments)    Mood changes, worsening depression    Metabolic Disorder Labs: Lab Results  Component Value Date   HGBA1C 5.8 (H) 01/24/2023   MPG 114 04/12/2021   MPG 119.76 11/08/2020   Lab Results  Component Value Date   PROLACTIN 9.2 11/09/2020   PROLACTIN 10.6 06/18/2019   Lab Results  Component Value Date   CHOL 221 (H) 01/24/2023   TRIG 171 (H) 01/24/2023   HDL 42 01/24/2023   CHOLHDL 5.3 (H) 01/24/2023   VLDL 18 11/08/2020   LDLCALC 148 (H) 01/24/2023   LDLCALC 125 (H) 07/04/2022   Lab Results  Component Value Date   TSH 1.24 05/15/2022    Therapeutic Level Labs: No results found for: "LITHIUM" No results found for: "CBMZ" No results found for: "VALPROATE"  Current Medications: Current Outpatient Medications  Medication Sig Dispense Refill   venlafaxine XR (EFFEXOR XR) 37.5 MG 24 hr capsule Take 1 capsule (37.5 mg total) by mouth daily with breakfast. 30 capsule 1   Albuterol Sulfate (PROAIR RESPICLICK) 108 (90 Base) MCG/ACT AEPB Inhale 1-2 puffs into the lungs  every 4 (four) hours as needed. 1 each 4   ALPRAZolam (XANAX) 0.5 MG tablet Take 0.5-1 tablets (0.25-0.5 mg total) by mouth 2 (two) times daily as needed for anxiety. 15 tablet 0   amLODipine (NORVASC) 5 MG tablet Take 1 tablet (5 mg total) by mouth daily. 90 tablet 1   Ascorbic Acid (VITAMIN C) 1000 MG tablet Take 1,000 mg by mouth daily.     b complex vitamins tablet Take 1 tablet by mouth daily.     buPROPion (WELLBUTRIN SR) 150 MG 12 hr tablet Take 1 tablet (150 mg total) by mouth daily. 30 tablet 1   busPIRone (BUSPAR) 15 MG tablet Take 1 tablet (15 mg total) by mouth 2 (two) times daily. Delete prior refill 180 tablet 0   CALCIUM PO Take 500 mg by mouth daily.     cefdinir (OMNICEF) 300 MG capsule Take 1 capsule (300 mg total) by mouth 2 (two) times daily. For 10 days. 20 capsule 0   cetirizine (ZYRTEC) 10 MG tablet Take 10 mg by mouth daily.     Cholecalciferol (VITAMIN D3) 10 MCG (400 UNIT) tablet Take 400 Units by mouth daily.     EPINEPHrine 0.3 mg/0.3 mL IJ SOAJ injection Inject 0.3 mg into the muscle once as needed (anaphylaxis/allergic reaction). 2 each 3   Ferrous Sulfate (IRON PO) Take 325 mg by mouth daily.     fluticasone (FLONASE) 50 MCG/ACT nasal spray Place 1 spray into both nostrils daily. 16 g 5   Ginger, Zingiber officinalis, (GINGER ROOT PO) Take by mouth.     levothyroxine (SYNTHROID) 75 MCG tablet Take 1 tablet (75 mcg total) by mouth daily before breakfast. 90 tablet 1   lisinopril (ZESTRIL) 20 MG tablet Take 1 tablet (20 mg total) by mouth daily. 90 tablet 1   Magnesium 500 MG CAPS Take 500 mg by mouth daily.     Multiple Vitamin (MULTIVITAMIN) capsule Take 1 capsule by mouth daily.     ofloxacin (FLOXIN) 0.3 % OTIC solution Place 10 drops into the right ear daily for 7 days. 5  mL 0   Omega-3 Fatty Acids (FISH OIL) 1000 MG CAPS Take 1 capsule by mouth 2 (two) times daily.     polyethylene glycol (MIRALAX) 17 g packet Take the Miralax 17 G twice daily until Large BM  then once daily. 30 each 6   Probiotic Product (PROBIOTIC ADVANCED PO) Take 1 capsule by mouth daily.     rosuvastatin (CRESTOR) 20 MG tablet Take 1 tablet (20 mg total) by mouth daily. 90 tablet 3   traZODone (DESYREL) 50 MG tablet Take 1 tablet (50 mg total) by mouth at bedtime. 30 tablet 0   No current facility-administered medications for this visit.     Psychiatric Specialty Exam: Review of Systems  Cardiovascular:  Negative for chest pain.  Psychiatric/Behavioral:  Positive for dysphoric mood. Negative for agitation and self-injury.     Last menstrual period 06/18/2017.There is no height or weight on file to calculate BMI.  General Appearance: Casual  Eye Contact:  Fair  Speech:  Clear and Coherent  Volume:  Normal  Mood: getting edgy, subdued   Affect:  Congruent  Thought Process:  Goal Directed  Orientation:  Full (Time, Place, and Person)  Thought Content:  Rumination  Suicidal Thoughts:  No  Homicidal Thoughts:  No  Memory:  Immediate;   Fair Recent;   Fair  Judgement:  Fair  Insight:  Fair  Psychomotor Activity:  Normal  Concentration:  Concentration: Fair and Attention Span: Fair  Recall:  Fair  Fund of Knowledge:Good  Language: Good  Akathisia:  No  Handed:    AIMS (if indicated):  No involuntary movements  Assets:  Desire for Improvement  ADL's:  Intact  Cognition: WNL  Sleep:   variable to fair   Screenings: AUDIT    Flowsheet Row Office Visit from 02/17/2023 in [facility] Primary Care & Sports Medicine at [facility]) [facility]  Alcohol Use Disorder Identification Test Final Score (AUDIT) 3 3      GAD-7    Flowsheet Row Office Visit from 02/24/2023 in [facility] at [facility] Office Visit from 11/15/2022 in [facility] Primary Care & Sports Medicine at [facility] Office Visit from 07/04/2022 in [facility]  Primary Care & Sports Medicine at [facility] Office Visit from 05/15/2022 in [facility] Primary Care & Sports Medicine at [facility] Video Visit from 09/11/2020 in [facility] Primary Care & Sports Medicine at [facility]  Total GAD-7 Score 14 12 9 17 16       PHQ2-9    Flowsheet Row Office Visit from 02/24/2023 in [facility] Primary Care & Sports Medicine at [facility] Office Visit from 11/15/2022 in [facility] Primary Care & Sports Medicine at [facility] Office Visit from 07/04/2022 in [facility] Primary Care & Sports Medicine at [facility] Office Visit from 05/15/2022 in [facility] Primary Care & Sports Medicine at [facility] Office Visit from 08/17/2021 in [facility] Primary Care & Sports Medicine at [facility]  PHQ-2 Total Score 4 6 3 4 2   PHQ-9 Total Score 18 22 12 18 7       Flowsheet Row Video Visit from 03/24/2023 in [facility] at [facility] Video Visit from 07/19/2022 in [facility] at [facility] ED from 05/03/2022 in [facility] Emergency Department at [facility]  C-SSRS RISK CATEGORY No Risk No Risk No Risk  Assessment and Plan: as follows  Prior documentation reviewed  Major depressive disorder recurrent severe without psychotic features:  Lower wellbutrin to 150mg  sr , continue therapy and circumstances are challenging regarding residence Add effexor small dose   Anxeity: fluctuates add effexor, continue buspar,   I    Generalized anxiety disorder; fluctuates, see above, add effexor, continue buspar, have tapered off cymbalta  Insomnia:can be irregular at times, takes trazadone prn will continue Fu 78m.    Thresa Ross, MD 6/3/20242:19 PM

## 2023-04-22 ENCOUNTER — Ambulatory Visit (INDEPENDENT_AMBULATORY_CARE_PROVIDER_SITE_OTHER): Payer: 59 | Admitting: Medical-Surgical

## 2023-04-22 ENCOUNTER — Other Ambulatory Visit (HOSPITAL_BASED_OUTPATIENT_CLINIC_OR_DEPARTMENT_OTHER): Payer: Self-pay

## 2023-04-22 ENCOUNTER — Encounter: Payer: Self-pay | Admitting: Medical-Surgical

## 2023-04-22 VITALS — BP 122/78 | HR 98 | Ht 61.0 in | Wt 171.0 lb

## 2023-04-22 DIAGNOSIS — Z0000 Encounter for general adult medical examination without abnormal findings: Secondary | ICD-10-CM | POA: Diagnosis not present

## 2023-04-22 DIAGNOSIS — E785 Hyperlipidemia, unspecified: Secondary | ICD-10-CM

## 2023-04-22 DIAGNOSIS — E038 Other specified hypothyroidism: Secondary | ICD-10-CM

## 2023-04-22 DIAGNOSIS — E063 Autoimmune thyroiditis: Secondary | ICD-10-CM

## 2023-04-22 DIAGNOSIS — I10 Essential (primary) hypertension: Secondary | ICD-10-CM

## 2023-04-22 DIAGNOSIS — Z87892 Personal history of anaphylaxis: Secondary | ICD-10-CM

## 2023-04-22 DIAGNOSIS — Z1231 Encounter for screening mammogram for malignant neoplasm of breast: Secondary | ICD-10-CM | POA: Diagnosis not present

## 2023-04-22 DIAGNOSIS — J4520 Mild intermittent asthma, uncomplicated: Secondary | ICD-10-CM | POA: Diagnosis not present

## 2023-04-22 LAB — CBC WITH DIFFERENTIAL/PLATELET
Basophils Absolute: 59 cells/uL (ref 0–200)
Basophils Relative: 0.6 %
Hemoglobin: 14.5 g/dL (ref 11.7–15.5)
Lymphs Abs: 2225 cells/uL (ref 850–3900)
MCH: 33.1 pg — ABNORMAL HIGH (ref 27.0–33.0)
MCHC: 34.5 g/dL (ref 32.0–36.0)
Neutrophils Relative %: 67.1 %
Platelets: 409 10*3/uL — ABNORMAL HIGH (ref 140–400)
RBC: 4.38 10*6/uL (ref 3.80–5.10)

## 2023-04-22 MED ORDER — EPINEPHRINE 0.3 MG/0.3ML IJ SOAJ
0.3000 mg | Freq: Once | INTRAMUSCULAR | 3 refills | Status: AC | PRN
Start: 2023-04-22 — End: ?
  Filled 2023-04-22: qty 2, 2d supply, fill #0

## 2023-04-22 MED ORDER — ALBUTEROL SULFATE HFA 108 (90 BASE) MCG/ACT IN AERS
1.0000 | INHALATION_SPRAY | RESPIRATORY_TRACT | 4 refills | Status: AC | PRN
Start: 2023-04-22 — End: ?
  Filled 2023-04-22: qty 6.7, 20d supply, fill #0

## 2023-04-22 MED ORDER — LISINOPRIL 20 MG PO TABS
20.0000 mg | ORAL_TABLET | Freq: Every day | ORAL | 1 refills | Status: DC
Start: 2023-04-22 — End: 2023-11-21
  Filled 2023-04-22 – 2023-05-27 (×2): qty 90, 90d supply, fill #0
  Filled 2023-08-25: qty 90, 90d supply, fill #1

## 2023-04-22 MED ORDER — AMLODIPINE BESYLATE 5 MG PO TABS
5.0000 mg | ORAL_TABLET | Freq: Every day | ORAL | 1 refills | Status: DC
Start: 2023-04-22 — End: 2023-11-21
  Filled 2023-04-22 – 2023-05-27 (×2): qty 90, 90d supply, fill #0
  Filled 2023-08-25: qty 90, 90d supply, fill #1

## 2023-04-22 NOTE — Progress Notes (Signed)
Complete physical exam  Patient: Shannon Santiago   DOB: June 28, 1966   57 y.o. Female  MRN: [id number]  Subjective:    No chief complaint on file.   Shannon Santiago is a 57 y.o. female who presents today for a complete physical exam. She reports consuming a general diet.  Biking and light weight training a couple of times a week.   She generally feels well. She reports sleeping fairly well. She does not have additional problems to discuss today.    Most recent fall risk assessment:    02/24/2023   11:48 AM  Fall Risk   Falls in the past year? 0  Number falls in past yr: 0  Injury with Fall? 0  Risk for fall due to : No Fall Risks  Follow up Falls evaluation completed     Most recent depression screenings:    02/24/2023   11:48 AM 11/15/2022    8:53 AM  PHQ 2/9 Scores  PHQ - 2 Score 4 6  PHQ- 9 Score 18 22    Vision:Not within last year , Dental: No current dental problems and Receives regular dental care, and STD: The patient denies history of sexually transmitted disease.    Patient Care Team: Christen Butter, NP as PCP - General (Nurse Practitioner) Thomasene Ripple, DO as PCP - Cardiology (Cardiology)   Outpatient Medications Prior to Visit  Medication Sig   ALPRAZolam (XANAX) 0.5 MG tablet Take 0.5-1 tablets (0.25-0.5 mg total) by mouth 2 (two) times daily as needed for anxiety.   Ascorbic Acid (VITAMIN C) 1000 MG tablet Take 1,000 mg by mouth daily.   b complex vitamins tablet Take 1 tablet by mouth daily.   buPROPion (WELLBUTRIN SR) 150 MG 12 hr tablet Take 1 tablet (150 mg total) by mouth daily.   busPIRone (BUSPAR) 15 MG tablet Take 1 tablet (15 mg total) by mouth 2 (two) times daily. Delete prior refill   CALCIUM PO Take 500 mg by mouth daily.   cetirizine (ZYRTEC) 10 MG tablet Take 10 mg by mouth daily.   Cholecalciferol (VITAMIN D3) 10 MCG (400 UNIT) tablet Take 400 Units by mouth daily.   Ferrous Sulfate (IRON PO) Take 325 mg by mouth daily.   fluticasone  (FLONASE) 50 MCG/ACT nasal spray Place 1 spray into both nostrils daily.   Ginger, Zingiber officinalis, (GINGER ROOT PO) Take by mouth.   levothyroxine (SYNTHROID) 75 MCG tablet Take 1 tablet (75 mcg total) by mouth daily before breakfast.   Magnesium 500 MG CAPS Take 500 mg by mouth daily.   Multiple Vitamin (MULTIVITAMIN) capsule Take 1 capsule by mouth daily.   Omega-3 Fatty Acids (FISH OIL) 1000 MG CAPS Take 1 capsule by mouth 2 (two) times daily.   polyethylene glycol (MIRALAX) 17 g packet Take the Miralax 17 G twice daily until Large BM then once daily.   Probiotic Product (PROBIOTIC ADVANCED PO) Take 1 capsule by mouth daily.   rosuvastatin (CRESTOR) 20 MG tablet Take 1 tablet (20 mg total) by mouth daily.   traZODone (DESYREL) 50 MG tablet Take 1 tablet (50 mg total) by mouth at bedtime.   venlafaxine XR (EFFEXOR XR) 37.5 MG 24 hr capsule Take 1 capsule (37.5 mg total) by mouth daily with breakfast.   [DISCONTINUED] Albuterol Sulfate (PROAIR RESPICLICK) 108 (90 Base) MCG/ACT AEPB Inhale 1-2 puffs into the lungs every 4 (four) hours as needed.   [DISCONTINUED] amLODipine (NORVASC) 5 MG tablet Take 1 tablet (5 mg total) by  mouth daily.   [DISCONTINUED] EPINEPHrine 0.3 mg/0.3 mL IJ SOAJ injection Inject 0.3 mg into the muscle once as needed (anaphylaxis/allergic reaction).   [DISCONTINUED] lisinopril (ZESTRIL) 20 MG tablet Take 1 tablet (20 mg total) by mouth daily.   [DISCONTINUED] cefdinir (OMNICEF) 300 MG capsule Take 1 capsule (300 mg total) by mouth 2 (two) times daily. For 10 days.   [DISCONTINUED] ofloxacin (FLOXIN) 0.3 % OTIC solution Place 10 drops into the right ear daily for 7 days.   No facility-administered medications prior to visit.    Review of Systems  Musculoskeletal:  Positive for neck pain.          Objective:     BP 122/78   Pulse 98   Ht 5\' 1"  (1.549 m)   Wt 171 lb (77.6 kg)   LMP 06/18/2017   SpO2 98%   BMI 32.31 kg/m    Physical Exam   No  results found for any visits on 04/22/23.     Assessment & Plan:    Routine Health Maintenance and Physical Exam  Immunization History  Administered Date(s) Administered   Influenza-Unspecified 08/01/2020, 08/16/2021, 07/21/2022   PFIZER(Purple Top)SARS-COV-2 Vaccination 11/10/2019, 12/01/2019   Pneumococcal Polysaccharide-23 12/10/2013   Tdap 05/05/2015   Zoster Recombinant(Shingrix) 05/06/2019, 04/16/2021    Health Maintenance  Topic Date Due   COVID-19 Vaccine (3 - Pfizer risk series) 05/08/2023 (Originally 12/29/2019)   MAMMOGRAM  04/27/2023   INFLUENZA VACCINE  05/22/2023   PAP SMEAR-Modifier  06/17/2024   Colonoscopy  01/15/2025   DTaP/Tdap/Td (2 - Td or Tdap) 05/04/2025   Hepatitis C Screening  Completed   HIV Screening  Completed   Zoster Vaccines- Shingrix  Completed   HPV VACCINES  Aged Out    Discussed health benefits of physical activity, and encouraged her to engage in regular exercise appropriate for her age and condition.  1. Annual physical exam Checking labs as below.  Up-to-date on preventative care.  Wellness information provided with AVS. - CBC with Differential/Platelet - COMPLETE METABOLIC PANEL WITH GFR - Lipid panel  2. Encounter for screening mammogram for malignant neoplasm of breast Mammogram ordered. - MM DIGITAL SCREENING BILATERAL; Future  3. Mild intermittent asthma without complication Stable.  Continue albuterol inhaler as needed. - Albuterol Sulfate (PROAIR RESPICLICK) 108 (90 Base) MCG/ACT AEPB; Inhale 1-2 puffs into the lungs every 4 (four) hours as needed.  Dispense: 1 each; Refill: 4  4. History of anaphylaxis Refilling EpiPen. - EPINEPHrine 0.3 mg/0.3 mL IJ SOAJ injection; Inject 0.3 mg into the muscle once as needed for anaphylaxis/allergic reaction.  Dispense: 2 each; Refill: 3  5. Hypertension goal BP (blood pressure) < 130/80 Blood pressure at goal.  Refilling amlodipine and lisinopril as below. - amLODipine (NORVASC) 5 MG  tablet; Take 1 tablet (5 mg total) by mouth daily.  Dispense: 90 tablet; Refill: 1 - lisinopril (ZESTRIL) 20 MG tablet; Take 1 tablet (20 mg total) by mouth daily.  Dispense: 90 tablet; Refill: 1  6. Hyperlipidemia, unspecified hyperlipidemia type Checking lipids today.  Continue Crestor as prescribed.  May need to titrate depending on results.  8. Hypothyroidism due to Hashimoto's thyroiditis Checking TSH.  Continue levothyroxine, titrate depending on results. - TSH  Return in about 6 months (around 10/23/2023) for chronic disease follow up.   Christen Butter, NP

## 2023-04-23 ENCOUNTER — Other Ambulatory Visit: Payer: Self-pay | Admitting: Medical-Surgical

## 2023-04-23 ENCOUNTER — Telehealth (HOSPITAL_COMMUNITY): Payer: Self-pay | Admitting: *Deleted

## 2023-04-23 ENCOUNTER — Other Ambulatory Visit (HOSPITAL_BASED_OUTPATIENT_CLINIC_OR_DEPARTMENT_OTHER): Payer: Self-pay

## 2023-04-23 DIAGNOSIS — E063 Autoimmune thyroiditis: Secondary | ICD-10-CM

## 2023-04-23 LAB — COMPLETE METABOLIC PANEL WITH GFR
AG Ratio: 1.8 (calc) (ref 1.0–2.5)
ALT: 36 U/L — ABNORMAL HIGH (ref 6–29)
AST: 22 U/L (ref 10–35)
Albumin: 4.6 g/dL (ref 3.6–5.1)
Alkaline phosphatase (APISO): 82 U/L (ref 37–153)
BUN: 16 mg/dL (ref 7–25)
CO2: 25 mmol/L (ref 20–32)
Calcium: 9.9 mg/dL (ref 8.6–10.4)
Chloride: 104 mmol/L (ref 98–110)
Creat: 0.91 mg/dL (ref 0.50–1.03)
Globulin: 2.5 g/dL (calc) (ref 1.9–3.7)
Glucose, Bld: 95 mg/dL (ref 65–99)
Potassium: 4.2 mmol/L (ref 3.5–5.3)
Sodium: 140 mmol/L (ref 135–146)
Total Bilirubin: 0.3 mg/dL (ref 0.2–1.2)
Total Protein: 7.1 g/dL (ref 6.1–8.1)
eGFR: 74 mL/min/{1.73_m2} (ref >=60)

## 2023-04-23 LAB — LIPID PANEL
Cholesterol: 168 mg/dL (ref <200)
HDL: 35 mg/dL — ABNORMAL LOW (ref >=50)
LDL Cholesterol (Calc): 93 mg/dL (calc)
Non-HDL Cholesterol (Calc): 133 mg/dL (calc) — ABNORMAL HIGH (ref <130)
Total CHOL/HDL Ratio: 4.8 (calc) (ref <5.0)
Triglycerides: 301 mg/dL — ABNORMAL HIGH (ref <150)

## 2023-04-23 LAB — CBC WITH DIFFERENTIAL/PLATELET
Absolute Monocytes: 774 cells/uL (ref 200–950)
Eosinophils Absolute: 167 cells/uL (ref 15–500)
Eosinophils Relative: 1.7 %
HCT: 42 % (ref 35.0–45.0)
MCV: 95.9 fL (ref 80.0–100.0)
MPV: 10.2 fL (ref 7.5–12.5)
Monocytes Relative: 7.9 %
Neutro Abs: 6576 cells/uL (ref 1500–7800)
RDW: 12.1 % (ref 11.0–15.0)
Total Lymphocyte: 22.7 %
WBC: 9.8 10*3/uL (ref 3.8–10.8)

## 2023-04-23 LAB — TSH: TSH: 0.88 mIU/L (ref 0.40–4.50)

## 2023-04-23 MED ORDER — LEVOTHYROXINE SODIUM 75 MCG PO TABS
75.0000 ug | ORAL_TABLET | Freq: Every day | ORAL | 3 refills | Status: DC
Start: 2023-04-23 — End: 2024-07-11
  Filled 2023-04-23 – 2023-05-27 (×2): qty 90, 90d supply, fill #0
  Filled 2023-08-25: qty 90, 90d supply, fill #1
  Filled 2023-09-11 – 2024-01-19 (×2): qty 90, 90d supply, fill #2
  Filled 2024-04-14: qty 90, 90d supply, fill #3

## 2023-04-23 NOTE — Telephone Encounter (Signed)
Patient states she has not received refills per Rx  [facility]   amLODipine (NORVASC) 5 MG tablet [[id number]]  Order Details Dose: 5 mg Route: Oral Frequency: Daily  Dispense Quantity: 90 tablet Refills: 1 Fills remaining: 2       Sig: Take 1 tablet (5 mg total) by mouth daily.   buPROPion (WELLBUTRIN SR) 150 MG 12 hr tablet [[id number]]  Order Details Dose: 150 mg Route: Oral Frequency: Daily  Dispense Quantity: 30 tablet Refills: 1 Fills remaining: 0       Sig: Take 1 tablet (150 mg total) by mouth daily.   traZODone (DESYREL) 50 MG tablet [[id number]]  Order Details  Dose: 50 mg Route: Oral Frequency: Daily at bedtime  Dispense Quantity: 30 tablet Refills: 0 Fills remaining: 0       Sig: Take 1 tablet (50 mg total) by mouth at bedtime.         venlafaxine XR (EFFEXOR XR) 37.5 MG 24 hr capsule [[id number]]  Order Details Dose: 37.5 mg Route: Oral Frequency: Daily with breakfast  Dispense Quantity: 30 capsule Refills: 1 Fills remaining: 0       Sig: Take 1 capsule (37.5 mg total) by mouth daily with breakfast.       Last Appt  03/24/23 Next Appt   05/26/23

## 2023-04-28 ENCOUNTER — Telehealth (HOSPITAL_COMMUNITY): Payer: Self-pay | Admitting: *Deleted

## 2023-04-28 ENCOUNTER — Other Ambulatory Visit: Payer: Self-pay

## 2023-04-28 ENCOUNTER — Other Ambulatory Visit (HOSPITAL_BASED_OUTPATIENT_CLINIC_OR_DEPARTMENT_OTHER): Payer: Self-pay

## 2023-04-28 MED ORDER — BUPROPION HCL ER (SR) 150 MG PO TB12
150.0000 mg | ORAL_TABLET | Freq: Every day | ORAL | 0 refills | Status: DC
  Filled 2023-04-28: qty 30, 30d supply, fill #0

## 2023-04-28 MED ORDER — TRAZODONE HCL 50 MG PO TABS
50.0000 mg | ORAL_TABLET | Freq: Every day | ORAL | 0 refills | Status: DC
  Filled 2023-04-28: qty 30, 30d supply, fill #0

## 2023-04-28 MED ORDER — VENLAFAXINE HCL ER 37.5 MG PO CP24
37.5000 mg | ORAL_CAPSULE | Freq: Every day | ORAL | 0 refills | Status: DC
  Filled 2023-04-28: qty 30, 30d supply, fill #0

## 2023-04-28 NOTE — Addendum Note (Signed)
Addended by: Thresa Ross on: 04/28/2023 08:51 AM   Modules accepted: Orders

## 2023-04-28 NOTE — Telephone Encounter (Signed)
Called per PCP  request to inform -- Sent all except norvasc (amlodipine). Ask patient to reach out to PCP for this one   Patient then stated she didn't ask but has a refill coming up in  100 days fir the following --  busPIRone (BUSPAR) 15 MG tablet [[id number]]  Order Details Dose: 15 mg Route: Oral Frequency: 2 times daily  Dispense Quantity: 180 tablet Refills: 0 Fills remaining: 0       Sig: Take 1 tablet (15 mg total) by mouth 2 (two) times daily. Delete prior refill    [facility] - [facility]  [address] [place] [address]   Last appt    03/24/23 Next appt   05/26/23

## 2023-05-14 ENCOUNTER — Other Ambulatory Visit (HOSPITAL_COMMUNITY): Payer: Self-pay

## 2023-05-14 ENCOUNTER — Other Ambulatory Visit (HOSPITAL_COMMUNITY): Payer: Self-pay | Admitting: Psychiatry

## 2023-05-14 MED ORDER — BUSPIRONE HCL 15 MG PO TABS
15.0000 mg | ORAL_TABLET | Freq: Two times a day (BID) | ORAL | 0 refills | Status: DC
  Filled 2023-05-14: qty 180, 90d supply, fill #0

## 2023-05-15 ENCOUNTER — Other Ambulatory Visit (HOSPITAL_COMMUNITY): Payer: Self-pay

## 2023-05-22 ENCOUNTER — Other Ambulatory Visit: Payer: Self-pay

## 2023-05-22 ENCOUNTER — Other Ambulatory Visit (HOSPITAL_COMMUNITY): Payer: Self-pay | Admitting: Psychiatry

## 2023-05-22 ENCOUNTER — Other Ambulatory Visit (HOSPITAL_COMMUNITY): Payer: Self-pay

## 2023-05-22 MED ORDER — VENLAFAXINE HCL ER 37.5 MG PO CP24
37.5000 mg | ORAL_CAPSULE | Freq: Every day | ORAL | 0 refills | Status: DC
  Filled 2023-05-22: qty 30, 30d supply, fill #0

## 2023-05-22 MED ORDER — BUPROPION HCL ER (SR) 150 MG PO TB12
150.0000 mg | ORAL_TABLET | Freq: Every day | ORAL | 0 refills | Status: DC
  Filled 2023-05-22: qty 30, 30d supply, fill #0

## 2023-05-26 ENCOUNTER — Telehealth (HOSPITAL_COMMUNITY): Payer: 59 | Admitting: Psychiatry

## 2023-05-27 ENCOUNTER — Other Ambulatory Visit (HOSPITAL_COMMUNITY): Payer: Self-pay

## 2023-05-27 ENCOUNTER — Other Ambulatory Visit: Payer: Self-pay

## 2023-05-29 ENCOUNTER — Encounter: Payer: Self-pay | Admitting: Medical-Surgical

## 2023-06-03 ENCOUNTER — Encounter: Payer: Self-pay | Admitting: Medical-Surgical

## 2023-06-21 ENCOUNTER — Other Ambulatory Visit (HOSPITAL_COMMUNITY): Payer: Self-pay | Admitting: Psychiatry

## 2023-06-24 ENCOUNTER — Other Ambulatory Visit (HOSPITAL_COMMUNITY): Payer: Self-pay

## 2023-06-24 ENCOUNTER — Other Ambulatory Visit: Payer: Self-pay

## 2023-06-24 MED ORDER — VENLAFAXINE HCL ER 37.5 MG PO CP24
37.5000 mg | ORAL_CAPSULE | Freq: Every day | ORAL | 0 refills | Status: DC
  Filled 2023-06-24: qty 30, 30d supply, fill #0

## 2023-06-24 MED ORDER — BUPROPION HCL ER (SR) 150 MG PO TB12
150.0000 mg | ORAL_TABLET | Freq: Every day | ORAL | 0 refills | Status: DC
  Filled 2023-06-24: qty 30, 30d supply, fill #0

## 2023-06-30 ENCOUNTER — Telehealth (INDEPENDENT_AMBULATORY_CARE_PROVIDER_SITE_OTHER): Payer: 59 | Admitting: Psychiatry

## 2023-06-30 ENCOUNTER — Other Ambulatory Visit (HOSPITAL_COMMUNITY): Payer: Self-pay

## 2023-06-30 ENCOUNTER — Encounter (HOSPITAL_COMMUNITY): Payer: Self-pay | Admitting: Psychiatry

## 2023-06-30 DIAGNOSIS — F5102 Adjustment insomnia: Secondary | ICD-10-CM | POA: Diagnosis not present

## 2023-06-30 DIAGNOSIS — F411 Generalized anxiety disorder: Secondary | ICD-10-CM

## 2023-06-30 DIAGNOSIS — F333 Major depressive disorder, recurrent, severe with psychotic symptoms: Secondary | ICD-10-CM | POA: Diagnosis not present

## 2023-06-30 MED ORDER — BUPROPION HCL ER (SR) 100 MG PO TB12
100.0000 mg | ORAL_TABLET | Freq: Every day | ORAL | 0 refills | Status: DC
  Filled 2023-06-30 – 2023-07-02 (×2): qty 30, 30d supply, fill #0

## 2023-06-30 MED ORDER — BUSPIRONE HCL 15 MG PO TABS: 15.0000 mg | ORAL_TABLET | Freq: Every day | ORAL | Status: DC

## 2023-06-30 MED ORDER — VENLAFAXINE HCL ER 37.5 MG PO CP24
37.5000 mg | ORAL_CAPSULE | Freq: Every day | ORAL | 0 refills | Status: DC
  Filled 2023-06-30 – 2023-07-23 (×2): qty 30, 30d supply, fill #0

## 2023-06-30 NOTE — Progress Notes (Signed)
[facility] Follow up visit   Patient Identification: Shannon Santiago MRN:  [id number] Date of Evaluation:  06/30/2023 Referral Source: [facility] discharge Chief Complaint:  follow up depression, grief Visit Diagnosis:    ICD-10-CM   1. MDD (major depressive disorder), recurrent, severe, with psychosis (HCC)  F33.3     2. GAD (generalized anxiety disorder)  F41.1     3. Adjustment insomnia  F51.02      Virtual Visit via Video Note  I connected with Shannon Santiago on 06/30/23 at  3:30 PM EDT by a video enabled telemedicine application and verified that I am speaking with the correct person using two identifiers.  Location: Patient: home Provider: home office   I discussed the limitations of evaluation and management by telemedicine and the availability of in person appointments. The patient expressed understanding and agreed to proceed.      I discussed the assessment and treatment plan with the patient. The patient was provided an opportunity to ask questions and all were answered. The patient agreed with the plan and demonstrated an understanding of the instructions.   The patient was advised to call back or seek an in-person evaluation if the symptoms worsen or if the condition fails to improve as anticipated.  I provided 20 minutes of non-face-to-face time during this encounter.     History of Present Illness: Patient is a 57  years old currently single Caucasian female initially referred after hospital discharge January 21 she was admitted for 4 days. 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    Has got her house but difficult to let  go of her dad house with memories  Aggravating factors; grief of moms death,  and now dad's death, recents dad's death Modifying factors:  cat Duration since young age  Denies drug use Severity depression: manageable  Duration more then one year Associated Signs/Symptoms:   Past Psychiatric History: depression, anxity  Previous Psychotropic Medications: Yes   Substance Abuse History in the last 12 months:  No.  Consequences of Substance Abuse: NA  Past Medical History:  Past Medical History:  Diagnosis Date   Allergy    Anxiety    Asthma    Depression    Family history of breast cancer    Family history of colon cancer    Family history of lung cancer    Family history of prostate cancer    Hyperlipidemia    Hypertension    Hypothyroidism    Prolonged PTT (partial thromboplastin time) 06/23/2019   Normal PT   Thyroid disease     Past Surgical History:  Procedure Laterality Date   APPENDECTOMY     ENDOMETRIAL BIOPSY  06/21/2019   EYE SURGERY     x 5   HYSTEROSCOPY WITH D & C N/A 10/27/2019   Procedure: DILATATION AND CURETTAGE /HYSTEROSCOPY;  Surgeon: Allie Bossier, MD;  Location: [facility];  Service: Gynecology;  Laterality: N/A;    Family Psychiatric History: mom depression, anxiety, alcohol use  Family History:  Family History  Problem Relation Age of Onset   Hypertension Mother    Stroke Mother  Lung cancer Mother 24   Anxiety disorder Mother    Depression Mother    Alcohol abuse Mother    Skin cancer Father    Prostate cancer Father 91   Hypertension Father    Diabetes Father    Thyroid disease Maternal Aunt    Thyroid disease Maternal Uncle    Diabetes Brother    Hypertension Brother    Colon cancer Maternal Grandmother        dx 4s, d. 58s   Prostate cancer Paternal Grandfather 27   Breast cancer Cousin        dx 20s-30s    Social History:   Social History   Socioeconomic History   Marital status: Single    Spouse  name: Not on file   Number of children: Not on file   Years of education: Not on file   Highest education level: Associate degree: academic program  Occupational History   Not on file  Tobacco Use   Smoking status: Never   Smokeless tobacco: Never  Vaping Use   Vaping status: Never Used  Substance and Sexual Activity   Alcohol use: Yes    Comment: occ   Drug use: Never   Sexual activity: Not Currently    Birth control/protection: Abstinence  Other Topics Concern   Not on file  Social History Narrative   Not on file   Social Determinants of Health   Financial Resource Strain: Low Risk  (02/13/2023)   Overall Financial Resource Strain (CARDIA)    Difficulty of Paying Living Expenses: Not very hard  Food Insecurity: No Food Insecurity (02/13/2023)   Hunger Vital Sign    Worried About Running Out of Food in the Last Year: Never true    Ran Out of Food in the Last Year: Never true  Transportation Needs: No Transportation Needs (02/13/2023)   PRAPARE - Administrator, Civil Service (Medical): No    Lack of Transportation (Non-Medical): No  Physical Activity: Insufficiently Active (02/13/2023)   Exercise Vital Sign    Days of Exercise per Week: 1 day    Minutes of Exercise per Session: 20 min  Stress: Stress Concern Present (02/13/2023)   [employer] of Occupational Health - Occupational Stress Questionnaire    Feeling of Stress : To some extent  Social Connections: Moderately Isolated (02/13/2023)   Social Connection and Isolation Panel [NHANES]    Frequency of Communication with Friends and Family: Once a week    Frequency of Social Gatherings with Friends and Family: Twice a week    Attends Religious Services: 1 to 4 times per year    Active Member of [employer] or Organizations: No    Attends Engineer, structural: Not on file    Marital Status: Never married     Allergies:   Allergies  Allergen Reactions   Atorvastatin Other (See Comments)    Joint  Pain   Lurasidone Other (See Comments)    Other reaction(s): hallucinations   Molds & Smuts Anaphylaxis    Other reaction(s): confusion, cough, eye swelling   Wheat     Other reaction(s): abdominal pain, confusion, edema, flushing   Morphine And Codeine Other (See Comments)    Numb hands and feet   Peanut Oil Other (See Comments)    Stomach cramping, hand/foot swelling   Penicillins Hives   Polymyxin B-Trimethoprim Other (See Comments), Photosensitivity and Swelling   Abilify [Aripiprazole] Other (See Comments)    Suicidal thoughts and actions  Lurasidone Hcl     Tardive dyskinesia, akathisia   Citalopram Other (See Comments)    SSRI's ineffective - Citalopram, Fluoxetine, Sertraline, Excitalopram    Food Nausea Only, Swelling, Palpitations, Other (See Comments) and Cough    WHEAT   Prednisone Anxiety and Other (See Comments)    Mood changes, worsening depression   Singulair [Montelukast Sodium] Other (See Comments)    Mood changes, worsening depression    Metabolic Disorder Labs: Lab Results  Component Value Date   HGBA1C 5.8 (H) 01/24/2023   MPG 114 04/12/2021   MPG 119.76 11/08/2020   Lab Results  Component Value Date   PROLACTIN 9.2 11/09/2020   PROLACTIN 10.6 06/18/2019   Lab Results  Component Value Date   CHOL 168 04/22/2023   TRIG 301 (H) 04/22/2023   HDL 35 (L) 04/22/2023   CHOLHDL 4.8 04/22/2023   VLDL 18 11/08/2020   LDLCALC 93 04/22/2023   LDLCALC 148 (H) 01/24/2023   Lab Results  Component Value Date   TSH 0.88 04/22/2023    Therapeutic Level Labs: No results found for: "LITHIUM" No results found for: "CBMZ" No results found for: "VALPROATE"  Current Medications: Current Outpatient Medications  Medication Sig Dispense Refill   albuterol (VENTOLIN HFA) 108 (90 Base) MCG/ACT inhaler Inhale 1-2 puffs into the lungs every 4 (four) hours as needed. 6.7 g 4   ALPRAZolam (XANAX) 0.5 MG tablet Take 0.5-1 tablets (0.25-0.5 mg total) by mouth 2 (two)  times daily as needed for anxiety. 15 tablet 0   amLODipine (NORVASC) 5 MG tablet Take 1 tablet (5 mg total) by mouth daily. 90 tablet 1   Ascorbic Acid (VITAMIN C) 1000 MG tablet Take 1,000 mg by mouth daily.     b complex vitamins tablet Take 1 tablet by mouth daily.     buPROPion ER (WELLBUTRIN SR) 100 MG 12 hr tablet Take 1 tablet (100 mg total) by mouth daily. 30 tablet 0   busPIRone (BUSPAR) 15 MG tablet Take 1 tablet (15 mg total) by mouth daily.     CALCIUM PO Take 500 mg by mouth daily.     cetirizine (ZYRTEC) 10 MG tablet Take 10 mg by mouth daily.     Cholecalciferol (VITAMIN D3) 10 MCG (400 UNIT) tablet Take 400 Units by mouth daily.     EPINEPHrine 0.3 mg/0.3 mL IJ SOAJ injection Inject 0.3 mg into the muscle once as needed for anaphylaxis/allergic reaction. 2 each 3   Ferrous Sulfate (IRON PO) Take 325 mg by mouth daily.     fluticasone (FLONASE) 50 MCG/ACT nasal spray Place 1 spray into both nostrils daily. 16 g 5   Ginger, Zingiber officinalis, (GINGER ROOT PO) Take by mouth.     levothyroxine (SYNTHROID) 75 MCG tablet Take 1 tablet (75 mcg total) by mouth daily before breakfast. 90 tablet 3   lisinopril (ZESTRIL) 20 MG tablet Take 1 tablet (20 mg total) by mouth daily. 90 tablet 1   Magnesium 500 MG CAPS Take 500 mg by mouth daily.     Multiple Vitamin (MULTIVITAMIN) capsule Take 1 capsule by mouth daily.     Omega-3 Fatty Acids (FISH OIL) 1000 MG CAPS Take 1 capsule by mouth 2 (two) times daily.     polyethylene glycol (MIRALAX) 17 g packet Take the Miralax 17 G twice daily until Large BM then once daily. 30 each 6   Probiotic Product (PROBIOTIC ADVANCED PO) Take 1 capsule by mouth daily.     rosuvastatin (CRESTOR) 20 MG  tablet Take 1 tablet (20 mg total) by mouth daily. 90 tablet 3   venlafaxine XR (EFFEXOR XR) 37.5 MG 24 hr capsule Take 1 capsule (37.5 mg total) by mouth daily with breakfast. 30 capsule 0   No current facility-administered medications for this visit.      Psychiatric Specialty Exam: Review of Systems  Cardiovascular:  Negative for chest pain.  Psychiatric/Behavioral:  Positive for dysphoric mood. Negative for agitation and self-injury.     Last menstrual period 06/18/2017.There is no height or weight on file to calculate BMI.  General Appearance: Casual  Eye Contact:  Fair  Speech:  Clear and Coherent  Volume:  Normal  Mood: fair  Affect:  Congruent  Thought Process:  Goal Directed  Orientation:  Full (Time, Place, and Person)  Thought Content:  Rumination  Suicidal Thoughts:  No  Homicidal Thoughts:  No  Memory:  Immediate;   Fair Recent;   Fair  Judgement:  Fair  Insight:  Fair  Psychomotor Activity:  Normal  Concentration:  Concentration: Fair and Attention Span: Fair  Recall:  Fair  Fund of Knowledge:Good  Language: Good  Akathisia:  No  Handed:    AIMS (if indicated):  No involuntary movements  Assets:  Desire for Improvement  ADL's:  Intact  Cognition: WNL  Sleep:   variable to fair   Screenings: AUDIT    Flowsheet Row Office Visit from 02/17/2023 in [facility] at [facility] (Dis[facility]  Alcohol Use Disorder Identification Test Final Score (AUDIT) 3 3      GAD-7    Flowsheet Row Office Visit from 02/24/2023 in [facility] Medicine at [facility] Office Visit from 11/15/2022 in [facility] Primary Care & Sports Medicine at [facility] Office Visit from 07/04/2022 in [facility] Primary Care & Sports Medicine at [facility] Office Visit from 05/15/2022 in [facility] Primary Care & Sports Medicine at [facility] Video Visit from 09/11/2020 in [facility] Primary Care & Sports Medicine at [facility]  Total GAD-7 Score 14 12 9 17 16       PHQ2-9    Flowsheet Row Office Visit from 02/24/2023 in [facility] Primary Care &  Sports Medicine at [facility] Office Visit from 11/15/2022 in [facility] Primary Care & Sports Medicine at [facility] Office Visit from 07/04/2022 in [facility] Primary Care & Sports Medicine at [facility] Office Visit from 05/15/2022 in [facility] Primary Care & Sports Medicine at [facility] Office Visit from 08/17/2021 in [facility] Primary Care & Sports Medicine at [facility]  PHQ-2 Total Score 4 6 3 4 2   PHQ-9 Total Score 18 22 12 18 7       Flowsheet Row Video Visit from 03/24/2023 in [facility] at [facility] Video Visit from 07/19/2022 in [facility] at [facility] ED from 05/03/2022 in [facility] Emergency Department at [facility]  C-SSRS RISK CATEGORY No Risk No Risk No Risk       Assessment and Plan: as follows Prior documentation reviewed   Major depressive disorder recurrent severe without psychotic features:  Doing fair, will lower wellbutrin to 100mg  , she will confirm in 7 days if wants this dose  Adding effexor has helped will continue  Anxeity: fluctuates, continue effexor and buspar       Generalized anxiety disorder; fluctuates see above,  continue effexor  Insomnia:manageable without trazadone, continue sleep hygiene  Fu 44m.   Thresa Ross, MD 9/9/20243:51 PM

## 2023-07-02 ENCOUNTER — Other Ambulatory Visit (HOSPITAL_COMMUNITY): Payer: Self-pay

## 2023-07-03 ENCOUNTER — Other Ambulatory Visit (HOSPITAL_COMMUNITY): Payer: Self-pay

## 2023-07-23 ENCOUNTER — Other Ambulatory Visit: Payer: Self-pay

## 2023-07-23 ENCOUNTER — Other Ambulatory Visit (HOSPITAL_COMMUNITY): Payer: Self-pay | Admitting: Psychiatry

## 2023-07-23 MED ORDER — BUPROPION HCL ER (SR) 100 MG PO TB12
100.0000 mg | ORAL_TABLET | Freq: Every day | ORAL | 0 refills | Status: DC
  Filled 2023-07-23 – 2023-07-28 (×3): qty 30, 30d supply, fill #0

## 2023-07-24 ENCOUNTER — Other Ambulatory Visit (HOSPITAL_COMMUNITY): Payer: Self-pay

## 2023-07-25 ENCOUNTER — Other Ambulatory Visit (HOSPITAL_COMMUNITY): Payer: Self-pay

## 2023-07-28 ENCOUNTER — Other Ambulatory Visit: Payer: Self-pay

## 2023-07-28 ENCOUNTER — Other Ambulatory Visit (HOSPITAL_COMMUNITY): Payer: Self-pay

## 2023-08-18 ENCOUNTER — Other Ambulatory Visit (HOSPITAL_COMMUNITY): Payer: Self-pay | Admitting: Psychiatry

## 2023-08-18 MED ORDER — BUPROPION HCL ER (SR) 100 MG PO TB12
100.0000 mg | ORAL_TABLET | Freq: Every day | ORAL | 0 refills | Status: DC
  Filled 2023-08-18: qty 30, 30d supply, fill #0

## 2023-08-18 MED ORDER — VENLAFAXINE HCL ER 37.5 MG PO CP24
37.5000 mg | ORAL_CAPSULE | Freq: Every day | ORAL | 0 refills | Status: DC
  Filled 2023-08-18: qty 30, 30d supply, fill #0

## 2023-08-19 ENCOUNTER — Other Ambulatory Visit (HOSPITAL_COMMUNITY): Payer: Self-pay

## 2023-09-04 ENCOUNTER — Telehealth (HOSPITAL_COMMUNITY): Payer: Self-pay | Admitting: *Deleted

## 2023-09-04 ENCOUNTER — Other Ambulatory Visit (HOSPITAL_COMMUNITY): Payer: Self-pay

## 2023-09-04 MED ORDER — BUPROPION HCL ER (SR) 100 MG PO TB12
100.0000 mg | ORAL_TABLET | Freq: Every day | ORAL | 0 refills | Status: DC
  Filled 2023-09-04 (×2): qty 30, 30d supply, fill #0

## 2023-09-04 MED ORDER — VENLAFAXINE HCL ER 37.5 MG PO CP24
37.5000 mg | ORAL_CAPSULE | Freq: Every day | ORAL | 0 refills | Status: DC
  Filled 2023-09-04 (×2): qty 30, 30d supply, fill #0

## 2023-09-04 MED ORDER — BUSPIRONE HCL 15 MG PO TABS
15.0000 mg | ORAL_TABLET | Freq: Every day | ORAL | 0 refills | Status: DC
  Filled 2023-09-04 (×2): qty 30, 30d supply, fill #0

## 2023-09-04 NOTE — Telephone Encounter (Signed)
LVM per provider--- Sent all for 30 days. Need appointment no refills after this

## 2023-09-04 NOTE — Telephone Encounter (Signed)
Patient called after Rx informed they never received refills  buPROPion ER (WELLBUTRIN SR) 100 MG 12 hr tablet   busPIRone (BUSPAR) 15 MG tablet   venlafaxine XR (EFFEXOR XR) 37.5 MG 24 hr capsule  [place] [facility]   Next Appt --- None scheduled Last Appt --  06/30/23  Patient asked for a call back to discuss her medications cost with the new insurance coming 2025   [phone]

## 2023-09-04 NOTE — Addendum Note (Signed)
Addended by: Thresa Ross on: 09/04/2023 08:54 AM   Modules accepted: Orders

## 2023-09-05 ENCOUNTER — Other Ambulatory Visit: Payer: Self-pay

## 2023-09-05 ENCOUNTER — Other Ambulatory Visit (HOSPITAL_COMMUNITY): Payer: Self-pay

## 2023-09-05 ENCOUNTER — Encounter (HOSPITAL_COMMUNITY): Payer: Self-pay | Admitting: Psychiatry

## 2023-09-05 ENCOUNTER — Telehealth (INDEPENDENT_AMBULATORY_CARE_PROVIDER_SITE_OTHER): Payer: 59 | Admitting: Psychiatry

## 2023-09-05 DIAGNOSIS — F5102 Adjustment insomnia: Secondary | ICD-10-CM | POA: Diagnosis not present

## 2023-09-05 DIAGNOSIS — F411 Generalized anxiety disorder: Secondary | ICD-10-CM | POA: Diagnosis not present

## 2023-09-05 DIAGNOSIS — F333 Major depressive disorder, recurrent, severe with psychotic symptoms: Secondary | ICD-10-CM

## 2023-09-05 DIAGNOSIS — F332 Major depressive disorder, recurrent severe without psychotic features: Secondary | ICD-10-CM | POA: Diagnosis not present

## 2023-09-05 MED ORDER — BUPROPION HCL ER (SR) 100 MG PO TB12
100.0000 mg | ORAL_TABLET | Freq: Every day | ORAL | 0 refills | Status: DC
  Filled 2023-09-09: qty 90, 90d supply, fill #0

## 2023-09-05 MED ORDER — VENLAFAXINE HCL ER 37.5 MG PO CP24
37.5000 mg | ORAL_CAPSULE | Freq: Every day | ORAL | 0 refills | Status: DC
  Filled 2023-09-09: qty 90, 90d supply, fill #0

## 2023-09-05 MED ORDER — BUSPIRONE HCL 15 MG PO TABS: 15.0000 mg | ORAL_TABLET | Freq: Every day | ORAL | 0 refills | Status: DC

## 2023-09-05 NOTE — Progress Notes (Signed)
[facility] Follow up visit   Patient Identification: Shannon Santiago MRN:  [id number] Date of Evaluation:  09/05/2023 Referral Source: [facility] discharge Chief Complaint:  follow up depression, grief Visit Diagnosis:    ICD-10-CM   1. MDD (major depressive disorder), recurrent, severe, with psychosis (HCC)  F33.3     2. GAD (generalized anxiety disorder)  F41.1     3. Adjustment insomnia  F51.02     Virtual Visit via Video Note  I connected with [name] on 09/05/23 at 11 10 AM by a video enabled telemedicine application and verified that I am speaking with the correct person using two identifiers.  Location: Patient: home Provider: home office   I discussed the limitations of evaluation and management by telemedicine and the availability of in person appointments. The patient expressed understanding and agreed to proceed.     I discussed the assessment and treatment plan with the patient. The patient was provided an opportunity to ask questions and all were answered. The patient agreed with the plan and demonstrated an understanding of the instructions.   The patient was advised to call back or seek an in-person evaluation if the symptoms worsen or if the condition fails to improve as anticipated.  I provided 18 minutes of non-face-to-face time during this encounter.      History of Present Illness: Patient is a 57  years old currently single Caucasian female initially referred after hospital discharge January 21 she was admitted for 4 days. 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    PCP has started folate after gene testing enzymes review and says it has helped  This is early visit we have cut down wellbutrin to 100mg  last visit, less edgy and feels better She wants to taper down and take her self off meds, says cannot afford with her co pay next year  We discussed options but she wants to be off meds understands the risk of relapse  of depression but says doing stable and effexor dose is small Also talked of generic and that we can send 90 days supply to cover for next 3 -4 months and in case she tapers down to half dose can extend more till insurance conerns are taken care of  She overall feels that she can do well without meds  Aggravating factors; grief of moms death,  and now dad's death, recents dad's death Modifying factors:  cat Duration since young age  Denies drug use Severity depression: describes stable Duration more then one year Associated Signs/Symptoms:   Past Psychiatric History: depression, anxity  Previous Psychotropic Medications: Yes   Substance Abuse History in the last 12 months:  No.  Consequences of Substance Abuse: NA  Past Medical History:  Past Medical History:  Diagnosis Date   Allergy    Anxiety    Asthma    Depression    Family history of breast cancer    Family history of colon cancer    Family history of lung cancer    Family history of prostate cancer    Hyperlipidemia    Hypertension    Hypothyroidism    Prolonged PTT (partial thromboplastin time) 06/23/2019   Normal PT   Thyroid disease     Past Surgical History:  Procedure Laterality Date   APPENDECTOMY     ENDOMETRIAL BIOPSY  06/21/2019   EYE SURGERY     x 5   HYSTEROSCOPY WITH D & C N/A 10/27/2019   Procedure: DILATATION AND  CURETTAGE /HYSTEROSCOPY;  Surgeon: Allie Bossier, MD;  Location: [facility];  Service: Gynecology;  Laterality: N/A;    Family Psychiatric History: mom depression, anxiety, alcohol use  Family History:  Family History  Problem Relation Age of Onset   Hypertension Mother    Stroke Mother    Lung cancer Mother 49   Anxiety disorder Mother    Depression Mother    Alcohol abuse Mother    Skin cancer Father    Prostate cancer Father 47   Hypertension Father    Diabetes Father    Thyroid disease Maternal Aunt    Thyroid disease Maternal Uncle    Diabetes Brother     Hypertension Brother    Colon cancer Maternal Grandmother        dx 82s, d. 80s   Prostate cancer Paternal Grandfather 55   Breast cancer Cousin        dx 20s-30s    Social History:   Social History   Socioeconomic History   Marital status: Single    Spouse name: Not on file   Number of children: Not on file   Years of education: Not on file   Highest education level: Associate degree: academic program  Occupational History   Not on file  Tobacco Use   Smoking status: Never   Smokeless tobacco: Never  Vaping Use   Vaping status: Never Used  Substance and Sexual Activity   Alcohol use: Yes    Comment: occ   Drug use: Never   Sexual activity: Not Currently    Birth control/protection: Abstinence  Other Topics Concern   Not on file  Social History Narrative   Not on file   Social Determinants of Health   Financial Resource Strain: Low Risk  (02/13/2023)   Overall Financial Resource Strain (CARDIA)    Difficulty of Paying Living Expenses: Not very hard  Food Insecurity: No Food Insecurity (02/13/2023)   Hunger Vital Sign    Worried About Running Out of Food in the Last Year: Never true    Ran Out of Food in the Last Year: Never true  Transportation Needs: No Transportation Needs (02/13/2023)   PRAPARE - Administrator, Civil Service (Medical): No    Lack of Transportation (Non-Medical): No  Physical Activity: Insufficiently Active (02/13/2023)   Exercise Vital Sign    Days of Exercise per Week: 1 day    Minutes of Exercise per Session: 20 min  Stress: Stress Concern Present (02/13/2023)   [employer] of Occupational Health - Occupational Stress Questionnaire    Feeling of Stress : To some extent  Social Connections: Moderately Isolated (02/13/2023)   Social Connection and Isolation Panel [NHANES]    Frequency of Communication with Friends and Family: Once a week    Frequency of Social Gatherings with Friends and Family: Twice a week    Attends  Religious Services: 1 to 4 times per year    Active Member of [employer] or Organizations: No    Attends Engineer, structural: Not on file    Marital Status: Never married     Allergies:   Allergies  Allergen Reactions   Atorvastatin Other (See Comments)    Joint Pain   Lurasidone Other (See Comments)    Other reaction(s): hallucinations   Molds & Smuts Anaphylaxis    Other reaction(s): confusion, cough, eye swelling   Wheat     Other reaction(s): abdominal pain, confusion, edema, flushing   Morphine And  Codeine Other (See Comments)    Numb hands and feet   Peanut Oil Other (See Comments)    Stomach cramping, hand/foot swelling   Penicillins Hives   Polymyxin B-Trimethoprim Other (See Comments), Photosensitivity and Swelling   Abilify [Aripiprazole] Other (See Comments)    Suicidal thoughts and actions   Lurasidone Hcl     Tardive dyskinesia, akathisia   Citalopram Other (See Comments)    SSRI's ineffective - Citalopram, Fluoxetine, Sertraline, Excitalopram    Food Nausea Only, Swelling, Palpitations, Other (See Comments) and Cough    WHEAT   Prednisone Anxiety and Other (See Comments)    Mood changes, worsening depression   Singulair [Montelukast Sodium] Other (See Comments)    Mood changes, worsening depression    Metabolic Disorder Labs: Lab Results  Component Value Date   HGBA1C 5.8 (H) 01/24/2023   MPG 114 04/12/2021   MPG 119.76 11/08/2020   Lab Results  Component Value Date   PROLACTIN 9.2 11/09/2020   PROLACTIN 10.6 06/18/2019   Lab Results  Component Value Date   CHOL 168 04/22/2023   TRIG 301 (H) 04/22/2023   HDL 35 (L) 04/22/2023   CHOLHDL 4.8 04/22/2023   VLDL 18 11/08/2020   LDLCALC 93 04/22/2023   LDLCALC 148 (H) 01/24/2023   Lab Results  Component Value Date   TSH 0.88 04/22/2023    Therapeutic Level Labs: No results found for: "LITHIUM" No results found for: "CBMZ" No results found for: "VALPROATE"  Current  Medications: Current Outpatient Medications  Medication Sig Dispense Refill   albuterol (VENTOLIN HFA) 108 (90 Base) MCG/ACT inhaler Inhale 1-2 puffs into the lungs every 4 (four) hours as needed. 6.7 g 4   amLODipine (NORVASC) 5 MG tablet Take 1 tablet (5 mg total) by mouth daily. 90 tablet 1   Ascorbic Acid (VITAMIN C) 1000 MG tablet Take 1,000 mg by mouth daily.     b complex vitamins tablet Take 1 tablet by mouth daily.     buPROPion ER (WELLBUTRIN SR) 100 MG 12 hr tablet Take 1 tablet (100 mg total) by mouth daily. 90 tablet 0   busPIRone (BUSPAR) 15 MG tablet Take 1 tablet (15 mg total) by mouth daily. 90 tablet 0   CALCIUM PO Take 500 mg by mouth daily.     cetirizine (ZYRTEC) 10 MG tablet Take 10 mg by mouth daily.     Cholecalciferol (VITAMIN D3) 10 MCG (400 UNIT) tablet Take 400 Units by mouth daily.     EPINEPHrine 0.3 mg/0.3 mL IJ SOAJ injection Inject 0.3 mg into the muscle once as needed for anaphylaxis/allergic reaction. 2 each 3   Ferrous Sulfate (IRON PO) Take 325 mg by mouth daily.     fluticasone (FLONASE) 50 MCG/ACT nasal spray Place 1 spray into both nostrils daily. 16 g 5   Ginger, Zingiber officinalis, (GINGER ROOT PO) Take by mouth.     levothyroxine (SYNTHROID) 75 MCG tablet Take 1 tablet (75 mcg total) by mouth daily before breakfast. 90 tablet 3   lisinopril (ZESTRIL) 20 MG tablet Take 1 tablet (20 mg total) by mouth daily. 90 tablet 1   Magnesium 500 MG CAPS Take 500 mg by mouth daily.     Multiple Vitamin (MULTIVITAMIN) capsule Take 1 capsule by mouth daily.     Omega-3 Fatty Acids (FISH OIL) 1000 MG CAPS Take 1 capsule by mouth 2 (two) times daily.     polyethylene glycol (MIRALAX) 17 g packet Take the Miralax 17 G twice daily  until Large BM then once daily. 30 each 6   Probiotic Product (PROBIOTIC ADVANCED PO) Take 1 capsule by mouth daily.     rosuvastatin (CRESTOR) 20 MG tablet Take 1 tablet (20 mg total) by mouth daily. 90 tablet 3   venlafaxine XR (EFFEXOR  XR) 37.5 MG 24 hr capsule Take 1 capsule (37.5 mg total) by mouth daily with breakfast. 90 capsule 0   No current facility-administered medications for this visit.     Psychiatric Specialty Exam: Review of Systems  Cardiovascular:  Negative for chest pain.  Psychiatric/Behavioral:  Negative for agitation, dysphoric mood and self-injury.     Last menstrual period 06/18/2017.There is no height or weight on file to calculate BMI.  General Appearance: Casual  Eye Contact:  Fair  Speech:  Clear and Coherent  Volume:  Normal  Mood: fair  Affect:  Congruent  Thought Process:  Goal Directed  Orientation:  Full (Time, Place, and Person)  Thought Content:  Rumination  Suicidal Thoughts:  No  Homicidal Thoughts:  No  Memory:  Immediate;   Fair Recent;   Fair  Judgement:  Fair  Insight:  Fair  Psychomotor Activity:  Normal  Concentration:  Concentration: Fair and Attention Span: Fair  Recall:  Fair  Fund of Knowledge:Good  Language: Good  Akathisia:  No  Handed:    AIMS (if indicated):  No involuntary movements  Assets:  Desire for Improvement  ADL's:  Intact  Cognition: WNL  Sleep:   variable to fair   Screenings: AUDIT    Flowsheet Row Office Visit from 02/17/2023 in [facility] at [facility] (Discharged[facility]  Alcohol Use Disorder Identification Test Final Score (AUDIT) 3 3      GAD-7    Flowsheet Row Office Visit from 02/24/2023 in [facility] Primary Care & Sports Medicine at [facility] Office Visit from 11/15/2022 in [facility] Primary Care & [facility] Medicine at [facility] Office Visit from 07/04/2022 in [facility] Primary Care & Sports Medicine at [facility] Office Visit from 05/15/2022 in [facility] Primary Care & Sports Medicine at [facility] Video Visit from 09/11/2020 in [facility] Primary Care & Sports Medicine  at [facility]  Total GAD-7 Score 14 12 9 17 16       PHQ2-9    Flowsheet Row Office Visit from 02/24/2023 in [facility] Primary Care & Sports Medicine at [facility] Office Visit from 11/15/2022 in [facility] Primary Care & Sports Medicine at [facility] Office Visit from 07/04/2022 in [facility] Primary Care & Sports Medicine at [facility] Office Visit from 05/15/2022 in [facility] Primary Care & Sports Medicine at [facility] Office Visit from 08/17/2021 in [facility] Primary Care & Sports Medicine at [facility]  PHQ-2 Total Score 4 6 3 4 2   PHQ-9 Total Score 18 22 12 18 7       Flowsheet Row Video Visit from 03/24/2023 in [facility] at [facility] Video Visit from 07/19/2022 in [facility] at [facility] ED from 05/03/2022 in [facility] Emergency Department at [facility]  C-SSRS RISK CATEGORY No Risk No Risk No Risk       Assessment and Plan: as follows  Prior documentation reviewed    Major depressive disorder recurrent severe without psychotic features:  Stable wants to be off meds, can taper down effexor in next month with  gradual decrease or alternate day  Will send 90 days supply just in case as this may help her to extend the taper or continue till affordability   Anxeity: manageable, she wants to taper see above. Continue coping skills     Generalized anxiety disorder;  manageable, better, she wants to taper off effexor and buspar, see above  Continue coping skills will send 90 days    Insomnia manageable without trazadone   Fu 4-6 m.  Call earlier for concerns risk of being off meds aware  Thresa Ross, MD 11/15/202411:38 AM

## 2023-09-09 ENCOUNTER — Other Ambulatory Visit (HOSPITAL_COMMUNITY): Payer: Self-pay

## 2023-09-09 ENCOUNTER — Other Ambulatory Visit: Payer: Self-pay

## 2023-09-11 ENCOUNTER — Other Ambulatory Visit (HOSPITAL_COMMUNITY): Payer: Self-pay

## 2023-10-28 ENCOUNTER — Other Ambulatory Visit: Payer: Self-pay

## 2023-11-18 ENCOUNTER — Other Ambulatory Visit (HOSPITAL_COMMUNITY): Payer: Self-pay

## 2023-11-18 ENCOUNTER — Other Ambulatory Visit: Payer: Self-pay

## 2023-11-21 ENCOUNTER — Other Ambulatory Visit: Payer: Self-pay | Admitting: Medical-Surgical

## 2023-11-21 ENCOUNTER — Other Ambulatory Visit: Payer: Self-pay

## 2023-11-21 DIAGNOSIS — I10 Essential (primary) hypertension: Secondary | ICD-10-CM

## 2023-11-21 MED ORDER — LISINOPRIL 20 MG PO TABS
20.0000 mg | ORAL_TABLET | Freq: Every day | ORAL | 0 refills | Status: DC
  Filled 2023-11-21: qty 30, 30d supply, fill #0

## 2023-11-21 MED ORDER — AMLODIPINE BESYLATE 5 MG PO TABS
5.0000 mg | ORAL_TABLET | Freq: Every day | ORAL | 0 refills | Status: DC
  Filled 2023-11-21: qty 30, 30d supply, fill #0

## 2023-11-21 NOTE — Telephone Encounter (Signed)
Pls contact the pt to schedule 59-month HTN follow-up past due January. Sending 30 day med refills. Thanks

## 2023-11-21 NOTE — Telephone Encounter (Signed)
Called patient she in now scheduled and is aware only 30 days was sent in for refills

## 2023-12-01 ENCOUNTER — Ambulatory Visit (INDEPENDENT_AMBULATORY_CARE_PROVIDER_SITE_OTHER): Payer: 59 | Admitting: Medical-Surgical

## 2023-12-01 VITALS — BP 120/76 | HR 91 | Resp 20 | Ht 61.0 in | Wt 175.9 lb

## 2023-12-01 DIAGNOSIS — I10 Essential (primary) hypertension: Secondary | ICD-10-CM | POA: Diagnosis not present

## 2023-12-01 DIAGNOSIS — Z124 Encounter for screening for malignant neoplasm of cervix: Secondary | ICD-10-CM

## 2023-12-01 DIAGNOSIS — R7303 Prediabetes: Secondary | ICD-10-CM

## 2023-12-01 LAB — POCT GLYCOSYLATED HEMOGLOBIN (HGB A1C): Hemoglobin A1C: 5.7 % — AB (ref 4.0–5.6)

## 2023-12-01 NOTE — Progress Notes (Signed)
        Established patient visit  History, exam, impression, and plan:  1. Hypertension goal BP (blood pressure) < 130/80 (Primary) Pleasant 58 year old female presenting today for follow-up on hypertension.  She is currently taking amlodipine 5 mg and lisinopril 20 mg daily, tolerating both well without side effects.  Admits that she has not been exercising but as she is coming off of her psychiatric meds, she is starting to feel more energetic and wants to get back into her regular routine.  Follows a low-sodium diet.  Denies any concerning symptoms today.  Cardiopulmonary exam normal.  Blood pressure is at goal at 120/76.  Continue amlodipine and lisinopril as prescribed.  2. Prediabetes Long history of prediabetes with last hemoglobin A1c checked approximately 10 months ago.  At that time, her A1c was 5.8%.  Recheck today at 5.7%, still prediabetic but looking better.  - POCT HgB A1C  3. Cervical cancer screening Patient due for mammogram and cervical cancer screening.  She would like to be referred to OB/GYN for completion of this.  Referral placed. - Ambulatory referral to Obstetrics / Gynecology   Procedures performed this visit: None.  Return in about 6 months (around 05/30/2024) for annual physical exam.  __________________________________ Thayer Ohm, DNP, APRN, FNP-BC Primary Care and Sports Medicine [facility]

## 2023-12-18 ENCOUNTER — Other Ambulatory Visit (HOSPITAL_COMMUNITY): Payer: Self-pay

## 2023-12-18 ENCOUNTER — Other Ambulatory Visit: Payer: Self-pay | Admitting: Medical-Surgical

## 2023-12-18 DIAGNOSIS — I10 Essential (primary) hypertension: Secondary | ICD-10-CM

## 2023-12-18 MED ORDER — AMLODIPINE BESYLATE 5 MG PO TABS
5.0000 mg | ORAL_TABLET | Freq: Every day | ORAL | 0 refills | Status: DC
  Filled 2023-12-18: qty 30, 30d supply, fill #0

## 2023-12-18 MED ORDER — LISINOPRIL 20 MG PO TABS
20.0000 mg | ORAL_TABLET | Freq: Every day | ORAL | 0 refills | Status: DC
  Filled 2023-12-18: qty 30, 30d supply, fill #0

## 2024-01-16 ENCOUNTER — Other Ambulatory Visit: Payer: Self-pay | Admitting: Medical-Surgical

## 2024-01-16 DIAGNOSIS — I10 Essential (primary) hypertension: Secondary | ICD-10-CM

## 2024-01-19 ENCOUNTER — Other Ambulatory Visit: Payer: Self-pay

## 2024-01-19 ENCOUNTER — Other Ambulatory Visit: Payer: Self-pay | Admitting: Cardiology

## 2024-01-19 ENCOUNTER — Other Ambulatory Visit (HOSPITAL_COMMUNITY): Payer: Self-pay

## 2024-01-19 MED ORDER — ROSUVASTATIN CALCIUM 20 MG PO TABS
20.0000 mg | ORAL_TABLET | Freq: Every day | ORAL | 0 refills | Status: DC
  Filled 2024-01-19: qty 90, 90d supply, fill #0

## 2024-01-19 MED ORDER — LISINOPRIL 20 MG PO TABS
20.0000 mg | ORAL_TABLET | Freq: Every day | ORAL | 0 refills | Status: DC
  Filled 2024-01-19: qty 30, 30d supply, fill #0

## 2024-01-19 MED ORDER — AMLODIPINE BESYLATE 5 MG PO TABS
5.0000 mg | ORAL_TABLET | Freq: Every day | ORAL | 0 refills | Status: DC
  Filled 2024-01-19: qty 30, 30d supply, fill #0

## 2024-01-20 ENCOUNTER — Other Ambulatory Visit: Payer: Self-pay

## 2024-02-12 ENCOUNTER — Other Ambulatory Visit: Payer: Self-pay | Admitting: Medical-Surgical

## 2024-02-12 ENCOUNTER — Other Ambulatory Visit (HOSPITAL_COMMUNITY): Payer: Self-pay

## 2024-02-12 ENCOUNTER — Other Ambulatory Visit: Payer: Self-pay

## 2024-02-12 DIAGNOSIS — I10 Essential (primary) hypertension: Secondary | ICD-10-CM

## 2024-02-12 MED ORDER — LISINOPRIL 20 MG PO TABS
20.0000 mg | ORAL_TABLET | Freq: Every day | ORAL | 0 refills | Status: DC
  Filled 2024-02-12: qty 30, 30d supply, fill #0

## 2024-02-12 MED ORDER — AMLODIPINE BESYLATE 5 MG PO TABS
5.0000 mg | ORAL_TABLET | Freq: Every day | ORAL | 0 refills | Status: DC
  Filled 2024-02-12: qty 30, 30d supply, fill #0

## 2024-03-10 ENCOUNTER — Encounter (HOSPITAL_COMMUNITY): Payer: Self-pay | Admitting: Psychiatry

## 2024-03-13 ENCOUNTER — Other Ambulatory Visit: Payer: Self-pay | Admitting: Medical-Surgical

## 2024-03-13 DIAGNOSIS — I10 Essential (primary) hypertension: Secondary | ICD-10-CM

## 2024-03-16 ENCOUNTER — Other Ambulatory Visit (HOSPITAL_COMMUNITY): Payer: Self-pay

## 2024-03-16 MED ORDER — AMLODIPINE BESYLATE 5 MG PO TABS
5.0000 mg | ORAL_TABLET | Freq: Every day | ORAL | 0 refills | Status: DC
  Filled 2024-03-16: qty 30, 30d supply, fill #0

## 2024-03-16 MED ORDER — LISINOPRIL 20 MG PO TABS
20.0000 mg | ORAL_TABLET | Freq: Every day | ORAL | 0 refills | Status: DC
  Filled 2024-03-16: qty 30, 30d supply, fill #0

## 2024-03-17 ENCOUNTER — Other Ambulatory Visit: Payer: Self-pay

## 2024-03-17 ENCOUNTER — Telehealth (HOSPITAL_COMMUNITY): Payer: 59 | Admitting: Psychiatry

## 2024-04-12 ENCOUNTER — Other Ambulatory Visit: Payer: Self-pay | Admitting: Medical-Surgical

## 2024-04-12 ENCOUNTER — Other Ambulatory Visit (HOSPITAL_COMMUNITY): Payer: Self-pay

## 2024-04-12 DIAGNOSIS — I10 Essential (primary) hypertension: Secondary | ICD-10-CM

## 2024-04-12 MED ORDER — LISINOPRIL 20 MG PO TABS
20.0000 mg | ORAL_TABLET | Freq: Every day | ORAL | 0 refills | Status: DC
  Filled 2024-04-12: qty 30, 30d supply, fill #0

## 2024-04-12 MED ORDER — AMLODIPINE BESYLATE 5 MG PO TABS
5.0000 mg | ORAL_TABLET | Freq: Every day | ORAL | 0 refills | Status: DC
  Filled 2024-04-12: qty 30, 30d supply, fill #0

## 2024-04-14 ENCOUNTER — Other Ambulatory Visit: Payer: Self-pay

## 2024-04-19 ENCOUNTER — Other Ambulatory Visit: Payer: Self-pay | Admitting: Cardiology

## 2024-04-20 ENCOUNTER — Other Ambulatory Visit: Payer: Self-pay

## 2024-04-20 ENCOUNTER — Other Ambulatory Visit (HOSPITAL_COMMUNITY): Payer: Self-pay

## 2024-04-20 MED ORDER — ROSUVASTATIN CALCIUM 20 MG PO TABS
20.0000 mg | ORAL_TABLET | Freq: Every day | ORAL | 0 refills | Status: DC
  Filled 2024-04-20: qty 90, 90d supply, fill #0

## 2024-05-14 ENCOUNTER — Other Ambulatory Visit: Payer: Self-pay | Admitting: Medical-Surgical

## 2024-05-14 ENCOUNTER — Other Ambulatory Visit (HOSPITAL_COMMUNITY): Payer: Self-pay

## 2024-05-14 DIAGNOSIS — I10 Essential (primary) hypertension: Secondary | ICD-10-CM

## 2024-05-14 MED ORDER — LISINOPRIL 20 MG PO TABS
20.0000 mg | ORAL_TABLET | Freq: Every day | ORAL | 0 refills | Status: DC
  Filled 2024-05-14: qty 30, 30d supply, fill #0

## 2024-05-14 MED ORDER — AMLODIPINE BESYLATE 5 MG PO TABS
5.0000 mg | ORAL_TABLET | Freq: Every day | ORAL | 0 refills | Status: DC
  Filled 2024-05-14: qty 30, 30d supply, fill #0

## 2024-05-31 ENCOUNTER — Encounter: Payer: 59 | Admitting: Medical-Surgical

## 2024-06-08 ENCOUNTER — Encounter: Payer: Self-pay | Admitting: Medical-Surgical

## 2024-06-08 ENCOUNTER — Ambulatory Visit (INDEPENDENT_AMBULATORY_CARE_PROVIDER_SITE_OTHER): Admitting: Medical-Surgical

## 2024-06-08 ENCOUNTER — Other Ambulatory Visit (HOSPITAL_COMMUNITY)
Admission: RE | Admit: 2024-06-08 | Discharge: 2024-06-08 | Disposition: A | Discharge status: Home / Self Care | Source: Ambulatory Visit | Attending: Medical-Surgical | Admitting: Medical-Surgical

## 2024-06-08 VITALS — BP 130/74 | HR 98 | Resp 20 | Ht 61.0 in | Wt 171.1 lb

## 2024-06-08 DIAGNOSIS — Z124 Encounter for screening for malignant neoplasm of cervix: Secondary | ICD-10-CM | POA: Diagnosis not present

## 2024-06-08 DIAGNOSIS — E063 Autoimmune thyroiditis: Secondary | ICD-10-CM | POA: Diagnosis not present

## 2024-06-08 DIAGNOSIS — R7303 Prediabetes: Secondary | ICD-10-CM

## 2024-06-08 DIAGNOSIS — Z1231 Encounter for screening mammogram for malignant neoplasm of breast: Secondary | ICD-10-CM | POA: Diagnosis not present

## 2024-06-08 DIAGNOSIS — I10 Essential (primary) hypertension: Secondary | ICD-10-CM | POA: Diagnosis not present

## 2024-06-08 DIAGNOSIS — Z0000 Encounter for general adult medical examination without abnormal findings: Secondary | ICD-10-CM

## 2024-06-08 LAB — HM PAP SMEAR: HPV, high-risk: NOT DETECTED

## 2024-06-08 NOTE — Patient Instructions (Signed)
 Preventive Care 58-58 Years Old, Female  Preventive care refers to lifestyle choices and visits with your health care provider that can promote health and wellness. Preventive care visits are also called wellness exams.  What can I expect for my preventive care visit?  Counseling  Your health care provider may ask you questions about your:  Medical history, including:  Past medical problems.  Family medical history.  Pregnancy history.  Current health, including:  Menstrual cycle.  Method of birth control.  Emotional well-being.  Home life and relationship well-being.  Sexual activity and sexual health.  Lifestyle, including:  Alcohol, nicotine or tobacco, and drug use.  Access to firearms.  Diet, exercise, and sleep habits.  Work and work Astronomer.  Sunscreen use.  Safety issues such as seatbelt and bike helmet use.  Physical exam  Your health care provider will check your:  Height and weight. These may be used to calculate your BMI (body mass index). BMI is a measurement that tells if you are at a healthy weight.  Waist circumference. This measures the distance around your waistline. This measurement also tells if you are at a healthy weight and may help predict your risk of certain diseases, such as type 2 diabetes and high blood pressure.  Heart rate and blood pressure.  Body temperature.  Skin for abnormal spots.  What immunizations do I need?    Vaccines are usually given at various ages, according to a schedule. Your health care provider will recommend vaccines for you based on your age, medical history, and lifestyle or other factors, such as travel or where you work.  What tests do I need?  Screening  Your health care provider may recommend screening tests for certain conditions. This may include:  Lipid and cholesterol levels.  Diabetes screening. This is done by checking your blood sugar (glucose) after you have not eaten for a while (fasting).  Pelvic exam and Pap test.  Hepatitis B test.  Hepatitis C  test.  HIV (human immunodeficiency virus) test.  STI (sexually transmitted infection) testing, if you are at risk.  Lung cancer screening.  Colorectal cancer screening.  Mammogram. Talk with your health care provider about when you should start having regular mammograms. This may depend on whether you have a family history of breast cancer.  BRCA-related cancer screening. This may be done if you have a family history of breast, ovarian, tubal, or peritoneal cancers.  Bone density scan. This is done to screen for osteoporosis.  Talk with your health care provider about your test results, treatment options, and if necessary, the need for more tests.  Follow these instructions at home:  Eating and drinking    Eat a diet that includes fresh fruits and vegetables, whole grains, lean protein, and low-fat dairy products.  Take vitamin and mineral supplements as recommended by your health care provider.  Do not drink alcohol if:  Your health care provider tells you not to drink.  You are pregnant, may be pregnant, or are planning to become pregnant.  If you drink alcohol:  Limit how much you have to 0-1 drink a day.  Know how much alcohol is in your drink. In the [place] one drink equals one 12 oz bottle of beer (355 mL), one 5 oz glass of wine (148 mL), or one 1 oz glass of hard liquor (44 mL).  Lifestyle  Brush your teeth every morning and night with fluoride toothpaste. Floss one time each day.  Exercise for at least  30 minutes 5 or more days each week.  Do not use any products that contain nicotine or tobacco. These products include cigarettes, chewing tobacco, and vaping devices, such as e-cigarettes. If you need help quitting, ask your health care provider.  Do not use drugs.  If you are sexually active, practice safe sex. Use a condom or other form of protection to prevent STIs.  If you do not wish to become pregnant, use a form of birth control. If you plan to become pregnant, see your health care provider for a  prepregnancy visit.  Take aspirin only as told by your health care provider. Make sure that you understand how much to take and what form to take. Work with your health care provider to find out whether it is safe and beneficial for you to take aspirin daily.  Find healthy ways to manage stress, such as:  Meditation, yoga, or listening to music.  Journaling.  Talking to a trusted person.  Spending time with friends and family.  Minimize exposure to UV radiation to reduce your risk of skin cancer.  Safety  Always wear your seat belt while driving or riding in a vehicle.  Do not drive:  If you have been drinking alcohol. Do not ride with someone who has been drinking.  When you are tired or distracted.  While texting.  If you have been using any mind-altering substances or drugs.  Wear a helmet and other protective equipment during sports activities.  If you have firearms in your house, make sure you follow all gun safety procedures.  Seek help if you have been physically or sexually abused.  What's next?  Visit your health care provider once a year for an annual wellness visit.  Ask your health care provider how often you should have your eyes and teeth checked.  Stay up to date on all vaccines.  This information is not intended to replace advice given to you by your health care provider. Make sure you discuss any questions you have with your health care provider.  Document Revised: 04/04/2021 Document Reviewed: 04/04/2021  Elsevier Patient Education  2024 [employer].

## 2024-06-08 NOTE — Progress Notes (Signed)
 Complete physical exam  Patient: DAJA SHUPING   DOB: 12-19-1965   58 y.o. Female  MRN: [id number]  Subjective:    Chief Complaint  Patient presents with   Annual Exam    SHANTOYA GEURTS is a 58 y.o. female who presents today for a complete physical exam. She reports consuming a gluten free diet. Some intermittent exercise doing stationary biking and weight lifting. She generally feels fairly well. She reports sleeping poorly. She does not have additional problems to discuss today.    Most recent fall risk assessment:    02/24/2023   11:48 AM  Fall Risk   Falls in the past year? 0  Number falls in past yr: 0  Injury with Fall? 0  Risk for fall due to : No Fall Risks  Follow up Falls evaluation completed     Most recent depression screenings:    06/08/2024    3:23 PM 12/01/2023    2:33 PM  PHQ 2/9 Scores  PHQ - 2 Score 2 4  PHQ- 9 Score 10 10    Vision:Not within last year  and Dental: No current dental problems and Receives regular dental care    Patient Care Team: Willo Mini, NP as PCP - General (Nurse Practitioner) Tobb, Kardie, DO as PCP - Cardiology (Cardiology)   Outpatient Medications Prior to Visit  Medication Sig   albuterol  (VENTOLIN  HFA) 108 (90 Base) MCG/ACT inhaler Inhale 1-2 puffs into the lungs every 4 (four) hours as needed.   amLODipine  (NORVASC ) 5 MG tablet Take 1 tablet (5 mg total) by mouth daily.   Ascorbic Acid (VITAMIN C) 1000 MG tablet Take 1,000 mg by mouth daily.   b complex vitamins tablet Take 1 tablet by mouth daily.   CALCIUM  PO Take 500 mg by mouth daily.   cetirizine (ZYRTEC) 10 MG tablet Take 10 mg by mouth daily.   Cholecalciferol (VITAMIN D3) 10 MCG (400 UNIT) tablet Take 400 Units by mouth daily.   EPINEPHrine  0.3 mg/0.3 mL IJ SOAJ injection Inject 0.3 mg into the muscle once as needed for anaphylaxis/allergic reaction.   Ferrous Sulfate (IRON PO) Take 325 mg by mouth daily.   fluticasone  (FLONASE ) 50 MCG/ACT nasal spray  Place 1 spray into both nostrils daily.   Ginger, Zingiber officinalis, (GINGER ROOT PO) Take by mouth.   levothyroxine  (SYNTHROID ) 75 MCG tablet Take 1 tablet (75 mcg total) by mouth daily before breakfast.   lisinopril  (ZESTRIL ) 20 MG tablet Take 1 tablet (20 mg total) by mouth daily.   Magnesium  500 MG CAPS Take 500 mg by mouth daily.   Multiple Vitamin (MULTIVITAMIN) capsule Take 1 capsule by mouth daily.   Omega-3 Fatty Acids (FISH OIL) 1000 MG CAPS Take 1 capsule by mouth 2 (two) times daily.   polyethylene glycol (MIRALAX ) 17 g packet Take the Miralax  17 G twice daily until Large BM then once daily.   Probiotic Product (PROBIOTIC ADVANCED PO) Take 1 capsule by mouth daily.   rosuvastatin  (CRESTOR ) 20 MG tablet Take 1 tablet (20 mg total) by mouth daily. Please schedule yearly appointment for future refills. Thank you   No facility-administered medications prior to visit.    Review of Systems  Constitutional:  Negative for chills, fever, malaise/fatigue and weight loss.  HENT:  Positive for hearing loss and sore throat. Negative for congestion, ear pain and sinus pain.   Eyes:  Negative for blurred vision, photophobia and pain.  Respiratory:  Positive for shortness of breath and wheezing.  Negative for cough.   Cardiovascular:  Negative for chest pain, palpitations and leg swelling.  Gastrointestinal:  Positive for nausea. Negative for abdominal pain, constipation, diarrhea, heartburn and vomiting.  Genitourinary:  Negative for dysuria, frequency and urgency.  Musculoskeletal:  Negative for falls and neck pain.  Skin:  Positive for itching and rash.  Neurological:  Negative for dizziness, weakness and headaches.  Endo/Heme/Allergies:  Negative for polydipsia. Does not bruise/bleed easily.  Psychiatric/Behavioral:  Positive for depression. Negative for substance abuse and suicidal ideas. The patient is nervous/anxious.      Objective:     BP 130/74 (BP Location: Left Arm, Cuff  Size: Normal)   Pulse 98   Resp 20   Ht 5' 1 (1.549 m)   Wt 171 lb 1.9 oz (77.6 kg)   LMP 06/18/2017   SpO2 97%   BMI 32.33 kg/m    Physical Exam   No results found for any visits on 06/08/24.     Assessment & Plan:    Routine Health Maintenance and Physical Exam  Immunization History  Administered Date(s) Administered   Hepatitis B, PED/ADOLESCENT 04/13/2018   Influenza-Unspecified 08/01/2020, 08/16/2021, 07/21/2022, 07/31/2023   MMR 04/13/2018   PFIZER(Purple Top)SARS-COV-2 Vaccination 11/10/2019, 12/01/2019   Pneumococcal Polysaccharide-23 12/10/2013   Tdap 05/05/2015   Zoster Recombinant(Shingrix ) 05/06/2019, 04/16/2021    Health Maintenance  Topic Date Due   Pneumococcal Vaccine: 50+ Years (2 of 2 - PCV) 12/10/2014   Hepatitis B Vaccines 19-59 Average Risk (2 of 3 - 19+ 3-dose series) 05/11/2018   Cervical Cancer Screening (HPV/Pap Cotest)  06/17/2022   MAMMOGRAM  04/27/2023   COVID-19 Vaccine (3 - Pfizer risk series) 06/24/2024 (Originally 12/29/2019)   INFLUENZA VACCINE  01/18/2025 (Originally 05/21/2024)   Colonoscopy  01/15/2025   DTaP/Tdap/Td (2 - Td or Tdap) 05/04/2025   Hepatitis C Screening  Completed   HIV Screening  Completed   Zoster Vaccines- Shingrix   Completed   HPV VACCINES  Aged Out   Meningococcal B Vaccine  Aged Out   Pneumococcal Vaccine  Discontinued    Discussed health benefits of physical activity, and encouraged her to engage in regular exercise appropriate for her age and condition.  1. Annual physical exam (Primary) Checking labs as below.  Recommend updating vision and dental care when financially feasible. Wellness information provided with AVS. - Lipid panel - CBC with Differential/Platelet - CMP14+EGFR  2. Primary hypertension Blood pressure at goal.  Continue amlodipine  and lisinopril .  Checking labs. - Lipid panel - CBC with Differential/Platelet - CMP14+EGFR  3. Hypothyroidism due to Hashimoto's thyroiditis Checking  TSH.  Continue levothyroxine  - TSH  4. Prediabetes Checking A1c. - Hemoglobin A1c  5. Encounter for screening mammogram for malignant neoplasm of breast Mammogram ordered. - MM DIGITAL SCREENING BILATERAL; Future  6. Cervical cancer screening Pap smear with HPV cotesting done today. - Cytology - PAP   Return in about 6 months (around 12/09/2024) for HTN follow up.     Nekisha Mcdiarmid, NP

## 2024-06-09 ENCOUNTER — Ambulatory Visit: Payer: Self-pay | Admitting: Medical-Surgical

## 2024-06-09 ENCOUNTER — Other Ambulatory Visit: Payer: Self-pay | Admitting: Medical-Surgical

## 2024-06-09 DIAGNOSIS — I10 Essential (primary) hypertension: Secondary | ICD-10-CM

## 2024-06-09 LAB — CMP14+EGFR
ALT: 19 IU/L (ref 0–32)
AST: 15 IU/L (ref 0–40)
Albumin: 4.5 g/dL (ref 3.8–4.9)
Alkaline Phosphatase: 83 IU/L (ref 44–121)
BUN/Creatinine Ratio: 20 (ref 9–23)
BUN: 17 mg/dL (ref 6–24)
Bilirubin Total: 0.3 mg/dL (ref 0.0–1.2)
CO2: 16 mmol/L — ABNORMAL LOW (ref 20–29)
Calcium: 9.9 mg/dL (ref 8.7–10.2)
Chloride: 102 mmol/L (ref 96–106)
Creatinine, Ser: 0.86 mg/dL (ref 0.57–1.00)
Globulin, Total: 2.6 g/dL (ref 1.5–4.5)
Glucose: 90 mg/dL (ref 70–99)
Potassium: 4.2 mmol/L (ref 3.5–5.2)
Sodium: 141 mmol/L (ref 134–144)
Total Protein: 7.1 g/dL (ref 6.0–8.5)
eGFR: 78 mL/min/1.73 (ref >59)

## 2024-06-09 LAB — CYTOLOGY - PAP
Comment: NEGATIVE
Diagnosis: NEGATIVE
High risk HPV: NEGATIVE

## 2024-06-09 LAB — CBC WITH DIFFERENTIAL/PLATELET
Basophils Absolute: 0.1 x10E3/uL (ref 0.0–0.2)
Basos: 1 %
EOS (ABSOLUTE): 0.3 x10E3/uL (ref 0.0–0.4)
Eos: 3 %
Hematocrit: 41.7 % (ref 34.0–46.6)
Hemoglobin: 13.9 g/dL (ref 11.1–15.9)
Immature Grans (Abs): 0 x10E3/uL (ref 0.0–0.1)
Immature Granulocytes: 0 %
Lymphocytes Absolute: 2.3 x10E3/uL (ref 0.7–3.1)
Lymphs: 24 %
MCH: 33 pg (ref 26.6–33.0)
MCHC: 33.3 g/dL (ref 31.5–35.7)
MCV: 99 fL — ABNORMAL HIGH (ref 79–97)
Monocytes Absolute: 0.8 x10E3/uL (ref 0.1–0.9)
Monocytes: 9 %
Neutrophils Absolute: 5.9 x10E3/uL (ref 1.4–7.0)
Neutrophils: 63 %
Platelets: 417 x10E3/uL (ref 150–450)
RBC: 4.21 x10E6/uL (ref 3.77–5.28)
RDW: 12.5 % (ref 11.7–15.4)
WBC: 9.4 x10E3/uL (ref 3.4–10.8)

## 2024-06-09 LAB — LIPID PANEL
Chol/HDL Ratio: 4.6 ratio — ABNORMAL HIGH (ref 0.0–4.4)
Cholesterol, Total: 164 mg/dL (ref 100–199)
HDL: 36 mg/dL — ABNORMAL LOW (ref >39)
LDL Chol Calc (NIH): 91 mg/dL (ref 0–99)
Triglycerides: 216 mg/dL — ABNORMAL HIGH (ref 0–149)
VLDL Cholesterol Cal: 37 mg/dL (ref 5–40)

## 2024-06-09 LAB — HEMOGLOBIN A1C
Est. average glucose Bld gHb Est-mCnc: 120 mg/dL
Hgb A1c MFr Bld: 5.8 % — ABNORMAL HIGH (ref 4.8–5.6)

## 2024-06-09 LAB — TSH: TSH: 2.94 u[IU]/mL (ref 0.450–4.500)

## 2024-06-10 ENCOUNTER — Encounter: Payer: Self-pay | Admitting: Medical-Surgical

## 2024-06-10 ENCOUNTER — Other Ambulatory Visit (HOSPITAL_COMMUNITY): Payer: Self-pay

## 2024-06-10 ENCOUNTER — Other Ambulatory Visit: Payer: Self-pay

## 2024-06-10 MED ORDER — LISINOPRIL 20 MG PO TABS
20.0000 mg | ORAL_TABLET | Freq: Every day | ORAL | 0 refills | Status: DC
  Filled 2024-06-10: qty 30, 30d supply, fill #0

## 2024-06-10 MED ORDER — AMLODIPINE BESYLATE 5 MG PO TABS
5.0000 mg | ORAL_TABLET | Freq: Every day | ORAL | 0 refills | Status: DC
  Filled 2024-06-10: qty 30, 30d supply, fill #0

## 2024-06-16 ENCOUNTER — Ambulatory Visit

## 2024-06-16 DIAGNOSIS — Z1231 Encounter for screening mammogram for malignant neoplasm of breast: Secondary | ICD-10-CM | POA: Diagnosis not present

## 2024-06-22 ENCOUNTER — Encounter: Payer: Self-pay | Admitting: Sports Medicine

## 2024-07-09 ENCOUNTER — Other Ambulatory Visit: Payer: Self-pay | Admitting: Medical-Surgical

## 2024-07-09 ENCOUNTER — Other Ambulatory Visit: Payer: Self-pay

## 2024-07-09 ENCOUNTER — Other Ambulatory Visit (HOSPITAL_COMMUNITY): Payer: Self-pay

## 2024-07-09 DIAGNOSIS — I10 Essential (primary) hypertension: Secondary | ICD-10-CM

## 2024-07-09 MED ORDER — AMLODIPINE BESYLATE 5 MG PO TABS
5.0000 mg | ORAL_TABLET | Freq: Every day | ORAL | 0 refills | Status: DC
  Filled 2024-07-09: qty 30, 30d supply, fill #0

## 2024-07-09 MED ORDER — LISINOPRIL 20 MG PO TABS
20.0000 mg | ORAL_TABLET | Freq: Every day | ORAL | 0 refills | Status: DC
  Filled 2024-07-09: qty 30, 30d supply, fill #0

## 2024-07-11 ENCOUNTER — Other Ambulatory Visit: Payer: Self-pay | Admitting: Medical-Surgical

## 2024-07-11 DIAGNOSIS — E063 Autoimmune thyroiditis: Secondary | ICD-10-CM

## 2024-07-12 ENCOUNTER — Other Ambulatory Visit: Payer: Self-pay

## 2024-07-12 ENCOUNTER — Other Ambulatory Visit (HOSPITAL_COMMUNITY): Payer: Self-pay

## 2024-07-12 MED ORDER — LEVOTHYROXINE SODIUM 75 MCG PO TABS
75.0000 ug | ORAL_TABLET | Freq: Every day | ORAL | 3 refills | Status: AC
  Filled 2024-07-12: qty 90, 90d supply, fill #0
  Filled 2024-10-11: qty 90, 90d supply, fill #1

## 2024-07-19 ENCOUNTER — Other Ambulatory Visit: Payer: Self-pay

## 2024-07-19 ENCOUNTER — Other Ambulatory Visit: Payer: Self-pay | Admitting: Cardiology

## 2024-07-19 ENCOUNTER — Other Ambulatory Visit (HOSPITAL_COMMUNITY): Payer: Self-pay

## 2024-07-19 MED ORDER — ROSUVASTATIN CALCIUM 20 MG PO TABS
20.0000 mg | ORAL_TABLET | Freq: Every day | ORAL | 0 refills | Status: DC
  Filled 2024-07-19: qty 90, 90d supply, fill #0

## 2024-07-26 ENCOUNTER — Encounter: Payer: Self-pay | Admitting: Cardiology

## 2024-08-09 ENCOUNTER — Other Ambulatory Visit: Payer: Self-pay | Admitting: Medical-Surgical

## 2024-08-09 DIAGNOSIS — I10 Essential (primary) hypertension: Secondary | ICD-10-CM

## 2024-08-11 ENCOUNTER — Other Ambulatory Visit (HOSPITAL_COMMUNITY): Payer: Self-pay

## 2024-08-11 MED ORDER — LISINOPRIL 20 MG PO TABS
20.0000 mg | ORAL_TABLET | Freq: Every day | ORAL | 0 refills | Status: DC
  Filled 2024-08-11: qty 30, 30d supply, fill #0

## 2024-08-11 MED ORDER — AMLODIPINE BESYLATE 5 MG PO TABS
5.0000 mg | ORAL_TABLET | Freq: Every day | ORAL | 0 refills | Status: DC
  Filled 2024-08-11: qty 30, 30d supply, fill #0

## 2024-09-08 ENCOUNTER — Other Ambulatory Visit: Payer: Self-pay | Admitting: Medical-Surgical

## 2024-09-08 DIAGNOSIS — I10 Essential (primary) hypertension: Secondary | ICD-10-CM

## 2024-09-08 MED ORDER — AMLODIPINE BESYLATE 5 MG PO TABS
5.0000 mg | ORAL_TABLET | Freq: Every day | ORAL | 2 refills | Status: AC
  Filled 2024-09-08: qty 30, 30d supply, fill #0
  Filled 2024-10-11: qty 30, 30d supply, fill #1

## 2024-09-08 MED ORDER — LISINOPRIL 20 MG PO TABS
20.0000 mg | ORAL_TABLET | Freq: Every day | ORAL | 2 refills | Status: AC
  Filled 2024-09-08: qty 30, 30d supply, fill #0
  Filled 2024-10-11: qty 30, 30d supply, fill #1

## 2024-09-09 ENCOUNTER — Other Ambulatory Visit: Payer: Self-pay

## 2024-09-09 ENCOUNTER — Other Ambulatory Visit (HOSPITAL_COMMUNITY): Payer: Self-pay

## 2024-10-11 ENCOUNTER — Other Ambulatory Visit: Payer: Self-pay | Admitting: Cardiology

## 2024-10-11 ENCOUNTER — Other Ambulatory Visit (HOSPITAL_COMMUNITY): Payer: Self-pay

## 2024-10-11 ENCOUNTER — Other Ambulatory Visit: Payer: Self-pay

## 2024-10-12 ENCOUNTER — Other Ambulatory Visit (HOSPITAL_COMMUNITY): Payer: Self-pay

## 2024-10-12 ENCOUNTER — Other Ambulatory Visit: Payer: Self-pay

## 2024-10-12 MED ORDER — ROSUVASTATIN CALCIUM 20 MG PO TABS
20.0000 mg | ORAL_TABLET | Freq: Every day | ORAL | 0 refills | Status: AC
  Filled 2024-10-12: qty 90, 90d supply, fill #0

## 2024-10-25 ENCOUNTER — Other Ambulatory Visit: Payer: Self-pay

## 2024-10-26 ENCOUNTER — Other Ambulatory Visit: Payer: Self-pay

## 2024-10-26 ENCOUNTER — Encounter: Payer: Self-pay | Admitting: Pharmacist

## 2024-11-03 ENCOUNTER — Ambulatory Visit: Attending: Cardiology | Admitting: Cardiology

## 2024-11-03 ENCOUNTER — Encounter: Payer: Self-pay | Admitting: Cardiology

## 2024-11-03 VITALS — BP 118/70 | HR 75 | Ht 61.0 in | Wt 173.4 lb

## 2024-11-03 DIAGNOSIS — I2510 Atherosclerotic heart disease of native coronary artery without angina pectoris: Secondary | ICD-10-CM

## 2024-11-03 DIAGNOSIS — E669 Obesity, unspecified: Secondary | ICD-10-CM | POA: Diagnosis not present

## 2024-11-03 DIAGNOSIS — E782 Mixed hyperlipidemia: Secondary | ICD-10-CM

## 2024-11-03 DIAGNOSIS — I10 Essential (primary) hypertension: Secondary | ICD-10-CM | POA: Diagnosis not present

## 2024-11-03 NOTE — Progress Notes (Signed)
 " Cardiology Office Note:    Date:  11/03/2024   ID:  Shannon Santiago, DOB 1966/04/14, MRN [id number]  PCP:  Shannon Mini, NP  Cardiologist:  Roshaunda Starkey, DO  Electrophysiologist:  None   Referring MD: Shannon Mini, NP   ' I am ok   History of Present Illness:    Shannon Santiago is a 59 y.o. female with a hx of mild coronary artery disease, hypertension, hyperlipidemia, depression, anxiety is here today for follow-up visit.   She has no specific complaints today.  Past Medical History:  Diagnosis Date   Allergy    Anxiety    Asthma    Depression    Family history of breast cancer    Family history of colon cancer    Family history of colon cancer    Family history of lung cancer    Family history of lung cancer    Family history of prostate cancer    Hyperlipidemia    Hypertension    Hypothyroidism    Prolonged PTT (partial thromboplastin time) 06/23/2019   Normal PT   Status post laparoscopic appendectomy 07/06/2021   Thyroid  disease     Past Surgical History:  Procedure Laterality Date   APPENDECTOMY     ENDOMETRIAL BIOPSY  06/21/2019   EYE SURGERY     x 5   HYSTEROSCOPY WITH D & C N/A 10/27/2019   Procedure: DILATATION AND CURETTAGE /HYSTEROSCOPY;  Surgeon: Shannon Harland BROCKS, MD;  Location: [facility];  Service: Gynecology;  Laterality: N/A;    Current Medications: Current Meds  Medication Sig   albuterol  (VENTOLIN  HFA) 108 (90 Base) MCG/ACT inhaler Inhale 1-2 puffs into the lungs every 4 (four) hours as needed.   amLODipine  (NORVASC ) 5 MG tablet Take 1 tablet (5 mg total) by mouth daily.   Ascorbic Acid (VITAMIN C) 1000 MG tablet Take 1,000 mg by mouth daily.   b complex vitamins tablet Take 1 tablet by mouth daily.   CALCIUM  PO Take 500 mg by mouth daily.   cetirizine (ZYRTEC) 10 MG tablet Take 10 mg by mouth daily.   Cholecalciferol (VITAMIN D3) 10 MCG (400 UNIT) tablet Take 400 Units by mouth daily.   EPINEPHrine  0.3 mg/0.3 mL IJ SOAJ  injection Inject 0.3 mg into the muscle once as needed for anaphylaxis/allergic reaction.   Ferrous Sulfate (IRON PO) Take 325 mg by mouth daily.   fluticasone  (FLONASE ) 50 MCG/ACT nasal spray Place 1 spray into both nostrils daily.   Ginger, Zingiber officinalis, (GINGER ROOT PO) Take by mouth.   levothyroxine  (SYNTHROID ) 75 MCG tablet Take 1 tablet (75 mcg total) by mouth daily before breakfast.   lisinopril  (ZESTRIL ) 20 MG tablet Take 1 tablet (20 mg total) by mouth daily.   Magnesium  500 MG CAPS Take 500 mg by mouth daily.   Multiple Vitamin (MULTIVITAMIN) capsule Take 1 capsule by mouth daily.   Omega-3 Fatty Acids (FISH OIL) 1000 MG CAPS Take 1 capsule by mouth 2 (two) times daily.   polyethylene glycol (MIRALAX ) 17 g packet Take the Miralax  17 G twice daily until Large BM then once daily.   Probiotic Product (PROBIOTIC ADVANCED PO) Take 1 capsule by mouth daily.   rosuvastatin  (CRESTOR ) 20 MG tablet Take 1 tablet (20 mg total) by mouth daily. Please schedule yearly appointment for future refills. Thank you     Allergies:   Atorvastatin , Lurasidone , Molds & smuts, Wheat, Morphine and codeine, Peanut oil, Penicillins, Polymyxin b-trimethoprim, Short ragweed pollen ext,  Abilify  [aripiprazole ], Citalopram, Prednisone, and Singulair [montelukast sodium]   Social History   Socioeconomic History   Marital status: Single    Spouse name: Not on file   Number of children: Not on file   Years of education: Not on file   Highest education level: Associate degree: academic program  Occupational History   Not on file  Tobacco Use   Smoking status: Never   Smokeless tobacco: Never  Vaping Use   Vaping status: Never Used  Substance and Sexual Activity   Alcohol use: Yes    Comment: occ   Drug use: Never   Sexual activity: Not Currently    Birth control/protection: Abstinence  Other Topics Concern   Not on file  Social History Narrative   Not on file   Social Drivers of Health    Tobacco Use: Low Risk (11/03/2024)   Patient History    Smoking Tobacco Use: Never    Smokeless Tobacco Use: Never    Passive Exposure: Not on file  Financial Resource Strain: Medium Risk (06/07/2024)   Overall Financial Resource Strain (CARDIA)    Difficulty of Paying Living Expenses: Somewhat hard  Food Insecurity: No Food Insecurity (06/07/2024)   Epic    Worried About Programme Researcher, Broadcasting/film/video in the Last Year: Never true    Ran Out of Food in the Last Year: Never true  Transportation Needs: No Transportation Needs (06/07/2024)   Epic    Lack of Transportation (Medical): No    Lack of Transportation (Non-Medical): No  Physical Activity: Insufficiently Active (06/07/2024)   Exercise Vital Sign    Days of Exercise per Week: 2 days    Minutes of Exercise per Session: 10 min  Stress: Stress Concern Present (06/07/2024)   [employer] of Occupational Health - Occupational Stress Questionnaire    Feeling of Stress: Rather much  Social Connections: Moderately Isolated (06/07/2024)   Social Connection and Isolation Panel    Frequency of Communication with Friends and Family: Twice a week    Frequency of Social Gatherings with Friends and Family: Twice a week    Attends Religious Services: 1 to 4 times per year    Active Member of [employer] or Organizations: No    Attends Banker Meetings: Not on file    Marital Status: Never married  Depression (PHQ2-9): Medium Risk (06/08/2024)   Depression (PHQ2-9)    PHQ-2 Score: 10  Alcohol Screen: Low Risk (06/07/2024)   Alcohol Screen    Last Alcohol Screening Score (AUDIT): 3  Housing: Low Risk (06/07/2024)   Epic    Unable to Pay for Housing in the Last Year: No    Number of Times Moved in the Last Year: 0    Homeless in the Last Year: No  Utilities: Not on file  Health Literacy: Not on file     Family History: The patient's family history includes Alcohol abuse in her mother; Anxiety disorder in her mother; Breast cancer in her  cousin; Colon cancer in her maternal grandmother; Depression in her mother; Diabetes in her brother and father; Hypertension in her brother, father, and mother; Lung cancer (age of onset: 53) in her mother; Prostate cancer (age of onset: 21) in her father; Prostate cancer (age of onset: 29) in her paternal grandfather; Skin cancer in her father; Stroke in her father and mother; Thyroid  disease in her maternal aunt and maternal uncle.  ROS:   Review of Systems  Constitution: Negative for decreased appetite, fever and weight  gain.  HENT: Negative for congestion, ear discharge, hoarse voice and sore throat.   Eyes: Negative for discharge, redness, vision loss in right eye and visual halos.  Cardiovascular: Negative for chest pain, dyspnea on exertion, leg swelling, orthopnea and palpitations.  Respiratory: Negative for cough, hemoptysis, shortness of breath and snoring.   Endocrine: Negative for heat intolerance and polyphagia.  Hematologic/Lymphatic: Negative for bleeding problem. Does not bruise/bleed easily.  Skin: Negative for flushing, nail changes, rash and suspicious lesions.  Musculoskeletal: Negative for arthritis, joint pain, muscle cramps, myalgias, neck pain and stiffness.  Gastrointestinal: Negative for abdominal pain, bowel incontinence, diarrhea and excessive appetite.  Genitourinary: Negative for decreased libido, genital sores and incomplete emptying.  Neurological: Negative for brief paralysis, focal weakness, headaches and loss of balance.  Psychiatric/Behavioral: Negative for altered mental status, depression and suicidal ideas.  Allergic/Immunologic: Negative for HIV exposure and persistent infections.    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  Coronary CTA March 2022 Aorta:  Normal size.  No  calcifications.  No dissection.   Aortic Valve:  Tri-leaflet.  No calcifications.   Coronary Arteries:  Normal coronary origin.  Co-dominance.   Coronary calcium  score of 34. This was 90th percentile for age, sex, and race matched control.   RCA is a large co-dominant artery that gives rise to PDA and PLA. There is a mild non-obstructive (25-49%) calcified plaque in the proximal vessel. There is a mild non-obstructive (25-49%) calcified plaque in the mid vessel.   Left main is a large artery that gives rise to LAD and LCX arteries. There is no plaque.   LAD is a large vessel that gives rise to one large D1 Branch that bifurcates. There is a minimal non-obstructive (1-24%) calcified plaque in the proximal vessel. There is a minimal non-obstructive (1-24%) calcified plaque in the mid vessel. There is a mild non-obstructive (25-49%) mixed plaque in the ostium of the D1 vessel- the Glagov phenomenon is noted. There is a mild non-obstructive plaque (25-49%) calcified plaque in the ostium of the D1 distal bifurcation.   LCX is a co-dominant artery that gives rise to one large OM1 branch. There is no plaque.   Other findings:   Normal pulmonary vein drainage into the left atrium.   Normal left atrial appendage without a thrombus.   Normal size of the pulmonary artery.   Extra-cardiac findings: See attached radiology report for non-cardiac structures.   IMPRESSION: 1. Coronary calcium  score of 34. This was 90th percentile for age, sex, and race matched control.   2. Normal coronary origin.  Co-dominance.   3. CAD-RADS 2. Mild non-obstructive CAD (25-49%). Consider non-atherosclerotic causes of chest pain. Consider preventive therapy and risk factor modification. CT-FFR sent based off of Ostial D1 remodeling.   September 2020 Transthoracic echocardiogram IMPRESSIONS   1. The left ventricle has normal systolic function with an ejection  fraction of 60-65%. The cavity size was  normal. Left ventricular diastolic  Doppler parameters are consistent with impaired relaxation.   2. The right ventricle has normal systolic function. The cavity was  normal. There is no increase in right ventricular wall thickness.   3. The aorta is normal unless otherwise noted.   FINDINGS   Left Ventricle: The left ventricle has normal systolic function, with an  ejection fraction of 60-65%. The cavity size  was normal. There is no  increase in left ventricular wall thickness. Left ventricular diastolic  Doppler parameters are consistent with  impaired relaxation.   Right Ventricle: The right ventricle has normal systolic function. The  cavity was normal. There is no increase in right ventricular wall  thickness.   Left Atrium: Left atrial size was normal in size.   Right Atrium: Right atrial size was normal in size. Right atrial pressure  is estimated at 3 mmHg.   Interatrial Septum: No atrial level shunt detected by color flow Doppler.   Pericardium: There is no evidence of pericardial effusion.   Mitral Valve: The mitral valve is normal in structure. Mitral valve  regurgitation is trivial by color flow Doppler.   Tricuspid Valve: The tricuspid valve is normal in structure. Tricuspid  valve regurgitation is trivial by color flow Doppler.   Aortic Valve: The aortic valve is normal in structure. Aortic valve  regurgitation was not assessed by color flow Doppler.   Pulmonic Valve: The pulmonic valve was normal in structure. Pulmonic valve  regurgitation was not assessed by color flow Doppler.   Aorta: The aorta is normal unless otherwise noted.   Venous: The inferior vena cava measures 2.18 cm, is normal in size with  greater than 50% respiratory variability.   Recent Labs: 06/08/2024: ALT 19; BUN 17; Creatinine, Ser 0.86; Hemoglobin 13.9; Platelets 417; Potassium 4.2; Sodium 141; TSH 2.940  Recent Lipid Panel    Component Value Date/Time   CHOL 164 06/08/2024 1554    TRIG 216 (H) 06/08/2024 1554   HDL 36 (L) 06/08/2024 1554   CHOLHDL 4.6 (H) 06/08/2024 1554   CHOLHDL 4.8 04/22/2023 1444   VLDL 18 11/08/2020 1753   LDLCALC 91 06/08/2024 1554   LDLCALC 93 04/22/2023 1444    Physical Exam:    VS:  BP 118/70 (BP Location: Right Arm, Patient Position: Sitting, Cuff Size: Normal)   Pulse 75   Ht 5' 1 (1.549 m)   Wt 173 lb 6.4 oz (78.7 kg)   LMP 06/18/2017   SpO2 98%   BMI 32.76 kg/m     Wt Readings from Last 3 Encounters:  11/03/24 173 lb 6.4 oz (78.7 kg)  06/08/24 171 lb 1.9 oz (77.6 kg)  12/01/23 175 lb 14.4 oz (79.8 kg)     GEN: Well nourished, well developed in no acute distress HEENT: Normal NECK: No JVD; No carotid bruits LYMPHATICS: No lymphadenopathy CARDIAC: S1S2 noted,RRR, no murmurs, rubs, gallops RESPIRATORY:  Clear to auscultation without rales, wheezing or rhonchi  ABDOMEN: Soft, non-tender, non-distended, +bowel sounds, no guarding. EXTREMITIES: No edema, No cyanosis, no clubbing MUSCULOSKELETAL:  No deformity  SKIN: Warm and dry NEUROLOGIC:  Alert and oriented x 3, non-focal PSYCHIATRIC:  Normal affect, good insight  ASSESSMENT:    1. Hypertension, unspecified type   2. Mixed hyperlipidemia   3. Mild CAD   4. Obesity (BMI 30-39.9)    PLAN:    CAD-no anginal symptoms-plan is to keep LDL less than 70.  Will get lipid profile.  Blood pressure is acceptable, continue with current antihypertensive regimen.  The patient understands the need to lose weight with diet and exercise. We have discussed specific strategies for this.  Will get lipid profile  The patient is in agreement with the above plan. The patient left the office in stable condition.  The patient will follow up in   Medication Adjustments/Labs and Tests Ordered: Current medicines are reviewed at length with the patient today.  Concerns  regarding medicines are outlined above.  Orders Placed This Encounter  Procedures   Lipid panel   EKG 12-Lead    No orders of the defined types were placed in this encounter.   Patient Instructions  Medication Instructions:  Your physician recommends that you continue on your current medications as directed. Please refer to the Current Medication list given to you today.  *If you need a refill on your cardiac medications before your next appointment, please call your pharmacy*  Labs: Lipids  Follow-Up: At [facility], you and your health needs are our priority.  As part of our continuing mission to provide you with exceptional heart care, our providers are all part of one team.  This team includes your primary Cardiologist (physician) and Advanced Practice Providers or APPs (Physician Assistants and Nurse Practitioners) who all work together to provide you with the care you need, when you need it.  Your next appointment:   1 year(s)  Provider:   Virgilio Broadhead, DO    Other Instructions:            Adopting a Healthy Lifestyle.  Know what a healthy weight is for you (roughly BMI <25) and aim to maintain this   Aim for 7+ servings of fruits and vegetables daily   65-80+ fluid ounces of water or unsweet tea for healthy kidneys   Limit to max 1 drink of alcohol per day; avoid smoking/tobacco   Limit animal fats in diet for cholesterol and heart health - choose grass fed whenever available   Avoid highly processed foods, and foods high in saturated/trans fats   Aim for low stress - take time to unwind and care for your mental health   Aim for 150 min of moderate intensity exercise weekly for heart health, and weights twice weekly for bone health   Aim for 7-9 hours of sleep daily   When it comes to diets, agreement about the perfect plan isnt easy to find, even among the experts. Experts at the [facility] of [employer] developed an idea known as the Healthy Eating Plate. Just imagine a plate divided into logical, healthy portions.   The emphasis is on diet  quality:   Load up on vegetables and fruits - one-half of your plate: Aim for color and variety, and remember that potatoes dont count.   Go for whole grains - one-quarter of your plate: Whole wheat, barley, wheat berries, quinoa, oats, brown rice, and foods made with them. If you want pasta, go with whole wheat pasta.   Protein power - one-quarter of your plate: Fish, chicken, beans, and nuts are all healthy, versatile protein sources. Limit red meat.   The diet, however, does go beyond the plate, offering a few other suggestions.   Use healthy plant oils, such as olive, canola, soy, corn, sunflower and peanut. Check the labels, and avoid partially hydrogenated oil, which have unhealthy trans fats.   If youre thirsty, drink water. Coffee and tea are good in moderation, but skip sugary drinks and limit milk and dairy products to one or two daily servings.   The type of carbohydrate in the diet is more important than the amount. Some sources of carbohydrates, such as vegetables, fruits, whole grains, and beans-are healthier than others.   Finally, stay active  Signed, Reyah Streeter, DO  11/03/2024 2:38 PM    [facility] "

## 2024-11-03 NOTE — Patient Instructions (Addendum)
 Medication Instructions:  Your physician recommends that you continue on your current medications as directed. Please refer to the Current Medication list given to you today.  *If you need a refill on your cardiac medications before your next appointment, please call your pharmacy*  Labs: Lipids  Follow-Up: At [facility], you and your health needs are our priority.  As part of our continuing mission to provide you with exceptional heart care, our providers are all part of one team.  This team includes your primary Cardiologist (physician) and Advanced Practice Providers or APPs (Physician Assistants and Nurse Practitioners) who all work together to provide you with the care you need, when you need it.  Your next appointment:   1 year(s)  Provider:   Kardie Tobb, DO    Other Instructions:

## 2024-11-04 LAB — LIPID PANEL
Chol/HDL Ratio: 4 ratio (ref 0.0–4.4)
Cholesterol, Total: 170 mg/dL (ref 100–199)
HDL: 43 mg/dL
LDL Chol Calc (NIH): 104 mg/dL — ABNORMAL HIGH (ref 0–99)
Triglycerides: 128 mg/dL (ref 0–149)
VLDL Cholesterol Cal: 23 mg/dL (ref 5–40)

## 2024-11-09 ENCOUNTER — Ambulatory Visit: Payer: Self-pay | Admitting: Cardiology

## 2024-12-09 ENCOUNTER — Ambulatory Visit: Admitting: Medical-Surgical
